# Patient Record
Sex: Male | Born: 1950 | Race: Black or African American | Hispanic: No | Smoking: Never smoker
Health system: Southern US, Community
[De-identification: ages and names within clinical notes are randomized; demographics above are authoritative.]

## PROBLEM LIST (undated history)

## (undated) DIAGNOSIS — Z7901 Long term (current) use of anticoagulants: Secondary | ICD-10-CM

## (undated) DIAGNOSIS — I10 Essential (primary) hypertension: Secondary | ICD-10-CM

## (undated) DIAGNOSIS — E7800 Pure hypercholesterolemia, unspecified: Secondary | ICD-10-CM

## (undated) DIAGNOSIS — I350 Nonrheumatic aortic (valve) stenosis: Secondary | ICD-10-CM

## (undated) HISTORY — DX: Essential (primary) hypertension: I10

## (undated) HISTORY — DX: Nonrheumatic aortic (valve) stenosis: I35.0

## (undated) HISTORY — DX: Pure hypercholesterolemia, unspecified: E78.00

## (undated) HISTORY — PX: VASECTOMY: SHX75

## (undated) HISTORY — DX: Long term (current) use of anticoagulants: Z79.01

---

## 2003-09-12 ENCOUNTER — Ambulatory Visit (HOSPITAL_COMMUNITY): Admission: RE | Admit: 2003-09-12 | Discharge: 2003-09-12 | Payer: Self-pay | Admitting: Gastroenterology

## 2005-02-11 HISTORY — PX: AORTIC VALVE REPLACEMENT: SHX41

## 2005-05-24 ENCOUNTER — Inpatient Hospital Stay (HOSPITAL_BASED_OUTPATIENT_CLINIC_OR_DEPARTMENT_OTHER): Admission: RE | Admit: 2005-05-24 | Discharge: 2005-05-24 | Payer: Self-pay | Admitting: Cardiology

## 2005-06-27 ENCOUNTER — Inpatient Hospital Stay (HOSPITAL_COMMUNITY): Admission: RE | Admit: 2005-06-27 | Discharge: 2005-07-02 | Payer: Self-pay | Admitting: Surgery

## 2005-06-27 ENCOUNTER — Encounter (INDEPENDENT_AMBULATORY_CARE_PROVIDER_SITE_OTHER): Payer: Self-pay | Admitting: *Deleted

## 2009-10-05 ENCOUNTER — Ambulatory Visit: Payer: Self-pay | Admitting: Cardiology

## 2009-11-07 ENCOUNTER — Ambulatory Visit: Payer: Self-pay | Admitting: Cardiology

## 2009-12-05 ENCOUNTER — Ambulatory Visit: Payer: Self-pay | Admitting: Cardiology

## 2010-01-02 ENCOUNTER — Ambulatory Visit: Payer: Self-pay | Admitting: Cardiology

## 2010-02-06 ENCOUNTER — Ambulatory Visit: Payer: Self-pay | Admitting: Cardiology

## 2010-03-08 ENCOUNTER — Ambulatory Visit: Payer: Self-pay | Admitting: Cardiology

## 2010-04-06 ENCOUNTER — Ambulatory Visit (INDEPENDENT_AMBULATORY_CARE_PROVIDER_SITE_OTHER): Payer: BC Managed Care – PPO | Admitting: Cardiology

## 2010-04-06 ENCOUNTER — Ambulatory Visit: Payer: Self-pay | Admitting: Cardiology

## 2010-04-06 DIAGNOSIS — E7800 Pure hypercholesterolemia, unspecified: Secondary | ICD-10-CM

## 2010-04-06 DIAGNOSIS — I059 Rheumatic mitral valve disease, unspecified: Secondary | ICD-10-CM

## 2010-04-06 DIAGNOSIS — I10 Essential (primary) hypertension: Secondary | ICD-10-CM

## 2010-04-06 DIAGNOSIS — Z7901 Long term (current) use of anticoagulants: Secondary | ICD-10-CM

## 2010-04-06 LAB — PROTIME-INR: INR: 3.4 — AB (ref <=1.1)

## 2010-04-11 ENCOUNTER — Ambulatory Visit: Payer: Self-pay | Admitting: Cardiology

## 2010-05-02 ENCOUNTER — Encounter: Payer: Self-pay | Admitting: Cardiology

## 2010-05-02 DIAGNOSIS — I350 Nonrheumatic aortic (valve) stenosis: Secondary | ICD-10-CM | POA: Insufficient documentation

## 2010-05-02 DIAGNOSIS — I10 Essential (primary) hypertension: Secondary | ICD-10-CM | POA: Insufficient documentation

## 2010-05-02 DIAGNOSIS — Z7901 Long term (current) use of anticoagulants: Secondary | ICD-10-CM | POA: Insufficient documentation

## 2010-05-02 DIAGNOSIS — E7800 Pure hypercholesterolemia, unspecified: Secondary | ICD-10-CM | POA: Insufficient documentation

## 2010-05-03 ENCOUNTER — Ambulatory Visit (INDEPENDENT_AMBULATORY_CARE_PROVIDER_SITE_OTHER): Payer: BC Managed Care – PPO | Admitting: *Deleted

## 2010-05-03 DIAGNOSIS — Z7901 Long term (current) use of anticoagulants: Secondary | ICD-10-CM

## 2010-05-03 DIAGNOSIS — I059 Rheumatic mitral valve disease, unspecified: Secondary | ICD-10-CM

## 2010-05-03 LAB — POCT INR: INR: 3.6

## 2010-05-15 ENCOUNTER — Ambulatory Visit (INDEPENDENT_AMBULATORY_CARE_PROVIDER_SITE_OTHER): Payer: BC Managed Care – PPO | Admitting: *Deleted

## 2010-05-15 DIAGNOSIS — Z7901 Long term (current) use of anticoagulants: Secondary | ICD-10-CM

## 2010-05-15 DIAGNOSIS — I359 Nonrheumatic aortic valve disorder, unspecified: Secondary | ICD-10-CM | POA: Insufficient documentation

## 2010-05-15 LAB — POCT INR: INR: 2.8

## 2010-05-31 ENCOUNTER — Other Ambulatory Visit: Payer: Self-pay | Admitting: Cardiology

## 2010-06-01 ENCOUNTER — Other Ambulatory Visit: Payer: Self-pay | Admitting: *Deleted

## 2010-06-01 DIAGNOSIS — Z952 Presence of prosthetic heart valve: Secondary | ICD-10-CM

## 2010-06-01 MED ORDER — WARFARIN SODIUM 5 MG PO TABS: 5.0000 mg | ORAL_TABLET | ORAL | Status: DC

## 2010-06-01 NOTE — Telephone Encounter (Signed)
escribe medication per fax request  

## 2010-06-12 ENCOUNTER — Ambulatory Visit (INDEPENDENT_AMBULATORY_CARE_PROVIDER_SITE_OTHER): Payer: BC Managed Care – PPO | Admitting: *Deleted

## 2010-06-12 ENCOUNTER — Encounter: Payer: BC Managed Care – PPO | Admitting: *Deleted

## 2010-06-12 DIAGNOSIS — Z954 Presence of other heart-valve replacement: Secondary | ICD-10-CM

## 2010-06-12 DIAGNOSIS — I359 Nonrheumatic aortic valve disorder, unspecified: Secondary | ICD-10-CM

## 2010-06-12 DIAGNOSIS — Z7901 Long term (current) use of anticoagulants: Secondary | ICD-10-CM | POA: Insufficient documentation

## 2010-06-12 LAB — POCT INR: INR: 3.3

## 2010-06-29 NOTE — Op Note (Signed)
Nathan Frye, Nathan Frye             ACCOUNT NO.:  [id number]   MEDICAL RECORD NO.:  [id number]          PATIENT TYPE:  INP   LOCATION:  2303                         FACILITY:  [facility]   PHYSICIAN:  [name], M.D.     DATE OF BIRTH:  02-23-1950   DATE OF PROCEDURE:  06/27/2005  DATE OF DISCHARGE:                                 OPERATIVE REPORT   PREOPERATIVE DIAGNOSIS:  Severe aortic stenosis.   POSTOPERATIVE DIAGNOSIS:  Severe aortic stenosis.   OPERATIVE PROCEDURE:  Aortic valve replacement using a 21-mm St. Jude Regent  mechanical valve.   ATTENDING SURGEON:  [name], M.D.   ASSISTANT:  [name], M.D.   ANESTHESIA:  General endotracheal.   CLINICAL HISTORY:  This patient is a 60 year old gentleman with a history of  bicuspid aortic valve with aortic stenosis, who has been followed by Dr.  Peter Frye since 2005.  He was initially asymptomatic, but has had 2  episodes of transient syncope over the past year during exercise.  He has  also had some exertional chest tightness and shortness of breath,  particularly when going up stairs carrying something.  He had an  echocardiogram in November of 2006 that showed progression of his aortic  stenosis with a peak gradient of 99 and a mean gradient of 62, with a valve  area of 0.68 sq cm.  This had progressed significantly from his previous  echo of April 2006.  Cardiac catheterization showed no significant coronary  disease.  There was about 20% eccentric plaque at the ostium of the LAD.  There is severe aortic stenosis with a peak transvalvular gradient of 68 and  a mean gradient of 58.  The aortic valve area was 0.8 sq cm and ejection  fraction was 65% to 70%.  There was minimal enlargement of the aortic root.  After review of the angiogram and examination of the patient, it was felt  that aortic valve replacement would be the best treatment to prevent further  complications of severe aortic stenosis.  I discussed  the alternatives with  the patient including use of a mechanical or tissue valve.  We discussed the  pros and cons of both and I recommended a mechanical valve, given his age of  60 with normal coronary arteries.  He had no contraindication to  anticoagulation.  He was in agreement with this and understood that he would  have to take Coumadin for the rest of his life.  I discussed the benefits  and risks of surgery including, but not limited to bleeding, blood  transfusion, infection, stroke, myocardial infarction and death.  He  understood and agreed to proceed.   OPERATIVE PROCEDURE:  The patient was taken to the operating room and placed  on the table in the a supine position.  After induction of general  endotracheal anesthesia, a Foley catheter was placed in the bluntly  developed using sterile technique.  Then the chest, abdomen and both lower  extremities were prepped and draped in the usual sterile manner.  A  transesophageal echo was performed by Anesthesiology.  This showed  severe  calcific aortic stenosis.  Left ventricular function was well-preserved.  There was no mitral regurgitation.   Then the chest was opened through a median sternotomy incision and the  pericardium opened in the midline.  Examination of the heart showed good  ventricular contractility.  The ascending aorta had no palpable plaques in  it.   Then the patient was heparinized and when an adequate activated clotting  time was achieved, the distal ascending aorta was cannulated using a 20-  [place] aortic cannula for arterial inflow.  Venous outflow was achieved  using a two-stage venous cannula through the right atrial appendage.  An  antegrade cardioplegia and vent cannula was inserted in the aortic root.  A  left ventricular vent was placed through the right superior pulmonary vein  and a retrograde cardioplegia cannula through the right atrium and the  coronary sinus.   The patient was placed on  cardiopulmonary bypass.  Then the aorta was cross-  clamped and 700 mL of cold blood antegrade cardioplegia were administered in  the aortic root with quick arrest of the heart.  Systemic hypothermia to 28  degrees centigrade and topical hypothermia with iced saline was used.  A  temperature probe was placed in the septum and an insulating pad in the  pericardium.  Additional doses of retrograde cardioplegia were given at  about 20-minute intervals to maintain a myocardial temperature around 10  degrees centigrade.   Then the aorta was opened transversely about 1 cm above the takeoff of the  right coronary ostium.  Examination of the native valve showed that there  were actually 3 leaflets.  There was complete fusion between the right  coronary and non-coronary cusps, given a functionally bicuspid appearance.  The valve was heavily calcified with immobile leaflets.  The annulus had  moderate calcification.  The coronary ostia were identified and had no  disease in them.  They were lying fairly high away from the annulus.  Then  the native aortic valve was excised.  The annulus was decalcified with  rongeurs.  Care was taken to remove all particulate debris.  The aortic root  and left ventricle were irrigated with iced saline solution.  Then the  annulus was sized and a 21-mm St. Jude Regent mechanical valve was chosen;  this had  model #[id number]-751, serial number [id number].  Then a series of  pledgeted 2-0 Ethibond horizontal mattress sutures were placed around the  annulus with the pledgets in the subannular position.  The sutures were  placed through the sewing ring and the valve lowered into place.  The  sutures were tied sequentially.  The valve seated nicely.  The leaflets  moved without obstruction.  Then the patient was rewarmed to 37 degrees  centigrade.  The aorta was closed in 2 layers using continuous 4-0 Prolene suture.  Then de-airing maneuvers were performed and the head placed  in a  Trendelenburg position.  The cross-clamp was removed with a time of 59  minutes.  There was a spontaneous return of sinus rhythm.  Two temporary  right ventricular and right atrial pacing wires were placed and brought out  through the skin.   When the patient had rewarmed to 37 degrees centigrade, he was weaned from  cardiopulmonary bypass on no inotropic agents.  Total bypass time was 85  minutes.  Cardiac function appeared excellent with a cardiac output of 5  L/min.  A transesophageal echocardiogram showed normal-functioning St. Jude  aortic valve prosthesis.  There was no evidence of regurgitation or  perivalvular leak.  Then the protamine was given and the venous and aortic  cannulas were removed without difficulty.  Hemostasis was achieved.  Two  chest tubes were placed with a tube in the posterior pericardium and one in  the anterior mediastinum.  The pericardium was loosely reapproximated over  the heart.  The sternum was closed with #6 stainless steel wires.  The  fascia was closed with a continuous #1 Vicryl suture.  The subcutaneous  tissue was closed with a continuous 2-0 Vicryl and the skin with a 3-0  Vicryl subcuticular  closure.  The sponge, needle and instrument counts were correct according to  the scrub nurse.  Dry sterile dressings were applied over the incision and  around the chest tubes which were hooked to Pleur-evac suction.  The patient  remained hemodynamically stable and was transported to the SICU in guarded,  but stable condition.      [name], M.D.  Electronically Signed     BB/MEDQ  D:  06/27/2005  T:  06/28/2005  Job:  [id number]   cc:   Nathan M. Frye, M.D.  Fax: [phone]   [facility]

## 2010-06-29 NOTE — Op Note (Signed)
Nathan Frye, Nathan Frye             ACCOUNT NO.:  [id number]   MEDICAL RECORD NO.:  [id number]          PATIENT TYPE:  INP   LOCATION:  2303                         FACILITY:  [facility]   PHYSICIAN:  Kaylyn Layer. Michelle Piper, M.D.   DATE OF BIRTH:  [birth year]   DATE OF PROCEDURE:  06/27/2005  DATE OF DISCHARGE:                                 OPERATIVE REPORT   PROCEDURE PERFORMED:  Transesophageal echocardiographic probe placement and  monitoring during aortic valve replacement.   I was consulted by [name], M.D. to place the transesophageal echo  probe into [name] for monitoring during aortic valve replacement.   The patient is a 60 year old gentleman with a congenital bicuspid aortic  valve who presents with tight aortic stenosis.  He presents today for aortic  valve replacement.   After uneventful induction of anesthesia and endotracheal intubation, a  transesophageal echo probe was easily passed into the stomach.  Initial  short axis view of the left ventricle showed left ventricular hypertrophy  with no wall motion abnormalities.  Turning to the four chamber view of the  mitral valve.  The valve tips coapted normally in all segments and there was  a small amount of anterior leaflet prolapse during systole in the 0 degree  view.  Placing Color Flow Doppler across the mitral valve there was a trace  of mitral regurgitation.  The left atrium was normal.  It was not enlarged.  The interatrial septum was intact.  Right side of the heart was within  normal limits.  Turning to the aortic valve, which was heavily calcified,  was bicuspid and was impossible to measure using planimetry the valve area.  In the longitudinal view there was large supravalvular jet of aortic  stenosis on placing Color across the valve with no aortic insufficiency.   The patient was placed on cardiopulmonary bypass by Dr. Laneta Simmers and underwent  an aortic valve replacement.  Immediately prior to discontinuing  cardiopulmonary bypass there was a significant amount of intracardiac air  seen which was removed using various deairing techniques by Dr. Laneta Simmers.  The  patient then was discontinued from cardiopulmonary bypass using a minimal  amount of inotropes and at this point the prosthetic aortic valve could be  seen in situ working well without any perivalvular leaks. There was no  aortic insufficiency.  Rest of the cardiac exam was unchanged.  The left  ventricle was hypertrophied.  There were no wall motion abnormalities.  Mitral valve showed trace mitral regurgitation.  The right side of the heart  was normal.  Interatrial septum was intact.   The transesophageal echo probe was then removed from the patient.  He was  taken to the intensive care unit intubated in good condition.           ______________________________  Kaylyn Layer Michelle Piper, M.D.     KDO/MEDQ  D:  06/27/2005  T:  06/28/2005  Job:  [id number]

## 2010-06-29 NOTE — Discharge Summary (Signed)
Nathan Frye, Nathan Frye             ACCOUNT NO.:  [id number]   MEDICAL RECORD NO.:  [id number]          PATIENT TYPE:  INP   LOCATION:  2010                         FACILITY:  [facility]   PHYSICIAN:  [name], M.D.     DATE OF BIRTH:  1950-10-18   DATE OF ADMISSION:  06/27/2005  DATE OF DISCHARGE:  07/02/2005                                 DISCHARGE SUMMARY   HISTORY OF PRESENT ILLNESS:  The patient is a 60 year old gentleman referred  to Dr. [name] for consideration of aortic valve replacement.  The  patient has a history of a congenitally bicuspid aortic valve with aortic  stenosis and has been followed by Dr. Sw[place]iland since 2005.  He was noted by  Dr. Andi Devon on physical examination to have a heart murmur.  He was  asymptomatic initially and has been followed with serial echocardiograms.  He has remained very active and exercises several days a week walking on the  treadmill as well as running.  He did have two episodes of transient syncope  over the past year during exercise.  He also reported exertional chest  tightness and shortness of breath when going up stairs, particularly if he  is carrying something.  He had an echocardiogram in November of 2006 which  showed progression of his aortic stenosis to a peak gradient of 99 and a  mean gradient of 62 with a valve area of 0.68.  This had been a significant  progression from previous study done in April of 2006.  He underwent cardiac  catheterization on May 24, 2005 which showed no significant coronary  disease.  There was about a 20% eccentric plaque in the ostium of the LAD.  There was severe aortic stenosis with a peak transvalvular gradient of 68  and a mean gradient of 58.  Aortic valve area was 0.8 sq cm.  Cardiac output  was 5.3-5.7.  Left ventricular ejection fraction was 65-70%.  There was  minimal enlargement of the aortic root.  The aortic valve was heavily  calcified.  The patient was referred to Dr. Laneta Simmers for  surgical opinion and  he recommended proceeding with aortic valve replacement.  He was admitted  this hospitalization for the procedure.   REVIEW OF SYSTEMS:  Please see the history and physical done at the time of  admission.   PAST MEDICAL HISTORY:  1.  Aortic stenosis as mentioned above.  2.  History of hypertension.   His only previous surgery was a vasectomy.   ALLERGIES:  None.   MEDICATIONS PRIOR TO ADMISSION:  Hyzaar 50/12.5 one daily.   FAMILY HISTORY:  Please see the history and physical done at the time of  admission.   SOCIAL HISTORY:  Please see the history and physical done at the time of  admission.   PHYSICAL EXAMINATION:  Please see the history and physical done at the time  of admission.   HOSPITAL COURSE:  Patient was admitted electively and on Jun 27, 2005 he was  taken to the operating room where he underwent the following procedure.  Aortic valve replacement with a 21  mm St. Jude Regent mechanical valve.  The  procedure was performed by [name], M.D., tolerated well and he was  taken to the surgical intensive care unit in stable condition.   POSTOPERATIVE HOSPITAL COURSE:  Patient has done quite well.  He has  remained hemodynamically stable.  He initially did require some inotropic  support, specifically Neo-Synephrine for postoperative azo dilation.  This  resolved over time and his Neo was weaned off.  He has maintained stable  hemodynamics since that time and additionally remains in normal sinus  rhythm.  The ventilator was weaned without difficulty.  All routine lines,  monitors, and drainage devices were discontinued in the standard fashion.  The patient does have a notable significant postoperative acute blood loss  anemia, but is clinically tolerating it quite well.  His most recent  hemoglobin and hematocrit are felt to be stable at 7.5 and 22.4,  respectively on Jul 01, 2005.  The patient's incision is healing well  without signs of  infection.  He has tolerated a routine and rapid advance in  activity commensurate for level of postoperative convalescence using routine  cardiac rehabilitation phase I protocols.  The patient has been started on  Coumadin and INR has been monitored daily.  His home dosing will be  determined prior to discharge.  The patient's renal function has remained  stable.  His most recent BUN and creatinine dated Jul 01, 2005 at 7 and 0.9,  respectively.  The patient's overall status is felt to be stable for  tentative discharge in the morning of Jul 02, 2005 pending morning round  reevaluation.   CONDITION ON DISCHARGE:  Stable and improving.   DISCHARGE MEDICATIONS:  1.  Toprol XL 25 mg daily.  2.  Tylox one or two every four to six hours as needed.  3.  Ferrous gluconate 324 mg twice daily.  4.  Folic acid 1 mg daily.  5.  Coumadin as determined by INR with monitoring through Dr. Elvis Coil      office.  6.  Lasix 40 mg daily for five days.  7.  K-Dur 20 mEq daily for five days.  8.  For pain Tylox one or two every four to six hours as needed.   FOLLOW-UP:  Patient is instructed to follow up with Dr. Swazil[place] in two weeks.  Additionally, follow up his office on Jul 04, 2005 to have his INR checked.  Additional follow-up will include Dr. Laneta Simmers.  The [facility] office will contact  him.   CONDITION ON DISCHARGE:  Stable and improved.   FINAL DIAGNOSES:  1.  Severe aortic stenosis now status post mechanical valve prosthesis      placement as described above.  2.  Acute blood loss anemia.  3.  Hypertension.      Rowe Clack, P.A.-C.      [name], M.D.  Electronically Signed    WEG/MEDQ  D:  07/01/2005  T:  07/02/2005  Job:  [id number]   cc:   Peter M. Swaziland, M.D.  Fax: [phone]   Gabriel Earing, M.D.  Fax: [phone]

## 2010-06-29 NOTE — H&P (Signed)
[name], [name] NO.:  [id number]   MEDICAL RECORD NO.:  [id number]           PATIENT TYPE:   LOCATION:                                 FACILITY:   PHYSICIAN:  Peter M. Swaziland, M.D.  DATE OF BIRTH:  May 08, 1950   DATE OF ADMISSION:  05/24/2005  DATE OF DISCHARGE:                                HISTORY & PHYSICAL   HISTORY OF PRESENT ILLNESS:  Mr. Nathan Frye a 60 year old, black male who has  a history of the bicuspid aortic valve with aortic stenosis.  This has been  followed serially by myself since 2005.  Over that time, he has had evidence  of severe aortic stenosis that has been progressive.  Initially, he was  asymptomatically and we recommended just following him clinically.  He is  very active.  However, this past year, he had two episodes while walking on  treadmill where he had transient syncope.  He states he transiently blacked  out.  He attributes this to the fact that he had not warmed up as much as  usual.  He had no associated chest pain or dyspnea and he continues to  exercise regularly.  However, his last echocardiogram dated January 10, 2005, showed progression of his aortic stenosis.  His peak instantaneous  gradient was 99 mmHg, mean gradient of 62 mmHg and his aortic valve area was  0.68 sq cm.  Compared to his prior study in April 2006, this had progressed  significantly.  Because of his now symptomatic status and severe aortic  stenosis, it is recommended he undergo cardiac catheterization at this time  with ultimate plans for aortic valve replacement surgery.   PAST MEDICAL HISTORY:  1.  Bicuspid aortic valve with severe aortic stenosis.  2.  Hypertension.   PAST SURGICAL HISTORY:  He has had prior vasectomy.   ALLERGIES:  No known drug allergies.   CURRENT MEDICATIONS:  Hyzaar 50/12.5 mg daily.   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  He denies tobacco or alcohol use.  He is married and has  one child.   FAMILY HISTORY:  Father is age 3.  Mother is age 68.  He does not know  their medical history as he was raised by other family members.  He has a  strong family history of hypertension.   REVIEW OF SYSTEMS:  His review of systems otherwise negative.   PHYSICAL EXAMINATION:  GENERAL:  The patient is a thin, black male in no  apparent distress.  VITAL SIGNS:  Weight 157, blood pressure 122/78, pulse is 80 and regular.  HEENT:  Normocephalic, atraumatic.  Pupils equal, round, reactive to light  accommodation.  Extraocular movements were full.  Oropharynx is clear.  NECK:  Neck is without JVD, adenopathy, thyromegaly or bruits.  LUNGS:  Lungs were clear.  CARDIAC:  Exam reveals a harsh, grade 3/6 systolic murmur heard best in the  aortic area radiating to the apex and to the carotids. There is no palpable  thrill.  There is no diastolic murmur.  He has no gallops.  ABDOMEN:  Abdomen is soft, nontender without hepatosplenomegaly, masses or  bruits.  Femoral and pedal pulses are 2+ and symmetric.  NEUROLOGIC:  Neurologic exam is nonfocal.   LABORATORY DATA:  Chest x-ray shows no active disease.  ECG shows normal  sinus rhythm, left atrial enlargement and LVH with repolarization  abnormality.   IMPRESSION:  1.  History of bicuspid aortic valve with severe aortic stenosis, now      symptomatic with episodes of exertional syncope.  2.  Hypertension.  3.  Left ventricular hypertrophy.   PLAN:  Recommend diagnostic cardiac catheterization.  Subsequent referral  for aortic valve replacement surgery.           ______________________________  Peter M. Swaziland, M.D.     PMJ/MEDQ  D:  05/23/2005  T:  05/23/2005  Job:  [id number]   cc:   Gabriel Earing, M.D.  Fax: [phone]

## 2010-06-29 NOTE — Op Note (Signed)
NAME:  Nathan Frye, Nathan Frye                         ACCOUNT NO.:  [id number]   MEDICAL RECORD NO.:  [id number]                   PATIENT TYPE:  AMB   LOCATION:  ENDO                                 FACILITY:  [facility]   PHYSICIAN:  Anselmo Rod, M.D.               DATE OF BIRTH:  Sep 09, 1950   DATE OF PROCEDURE:  09/12/2003  DATE OF DISCHARGE:                                 OPERATIVE REPORT   PROCEDURE PERFORMED:  Screening colonoscopy.   ENDOSCOPIST:  Anselmo Rod, M.D.   INSTRUMENT USED:  Olympus video colonoscope.   INDICATION FOR PROCEDURE:  A 60 year old African-American male undergoing  screening colonoscopy to rule out colonic polyps, masses, etc.   PREPROCEDURE PREPARATION:  Informed consent was procured from the patient.  The patient was fasted for eight hours prior to the procedure and prepped  with a bottle of magnesium citrate and a gallon of GoLYTELY the night prior  to the procedure.   PREPROCEDURE PHYSICAL:  VITAL SIGNS:  The patient had stable vital signs.  NECK:  Supple.  CHEST:  Clear to auscultation.  S1, S2 regular.  ABDOMEN:  Soft with normal bowel sounds.   DESCRIPTION OF PROCEDURE:  The patient was placed in the left lateral  decubitus position and sedated with 90 mg of Demerol and 9 mg of Versed in  slow incremental doses.  Once the patient was adequately sedate and  maintained on low-flow oxygen and continuous cardiac monitoring, the Olympus  video colonoscope was advanced from the rectum to the cecum.  The  appendiceal orifice and the ileocecal valve were clearly visualized and  photographed.  Small internal hemorrhoids were seen on retroflexion.  No  masses, polyps, or diverticula were present.  The patient tolerated the  procedure well without immediate complications.   IMPRESSION:  1. Normal colonoscopy up to the cecum except for small internal hemorrhoids.  2. No masses, polyps, or diverticula seen.   RECOMMENDATIONS:  1. A repeat colonoscopy  has been recommended in the next 10 years unless the     patient develops any abnormal symptoms in the interim.  2. Outpatient follow-up as the need arises in the future.                                               Anselmo Rod, M.D.    JNM/MEDQ  D:  09/12/2003  T:  09/12/2003  Job:  [id number]   cc:   Gabriel Earing, M.D.  [address]  [place]  [place] [address]  Fax: [phone]

## 2010-06-29 NOTE — Cardiovascular Report (Signed)
Nathan Frye, Nathan Frye             ACCOUNT NO.:  [id number]   MEDICAL RECORD NO.:  [id number]          PATIENT TYPE:  OIB   LOCATION:  1962                         FACILITY:  [facility]   PHYSICIAN:  Peter M. Sw[place]il[place], M.D.  DATE OF BIRTH:  October 09, 1950   DATE OF PROCEDURE:  05/24/2005  DATE OF DISCHARGE:                              CARDIAC CATHETERIZATION   INDICATIONS FOR PROCEDURE:  A 59 year old black male with bicuspid aortic  valve and severe aortic stenosis and is now symptomatic.  Cardiac  catheterization was performed for preoperative evaluation.   PROCEDURES:  Right and left catheterization, coronary and left ventricular  angiography.   EQUIPMENT USED:  4-French 4 cm right and left Judkins catheter, 4-French  pigtail catheter, 4-French left Amplatz-I catheter, 5-French arterial  sheath, 6-French venous sheath and a balloon tip Swan-Ganz catheter.   ACCESS:  Via the right femoral artery and vein using a standard Seldinger  technique.   MEDICATIONS:  Local anesthesia and 1% Xylocaine.   CONTRAST:  150 mL of Omnipaque.   HEMODYNAMIC DATA:  Right atrial pressures 8/6 with a mean of 4 mmHg.  Right  ventricular pressure is 37 with EDP of 7 mmHg.  Pulmonary artery pressure is  32/10 with a mean of 20 mmHg.  Pulmonary capillary wedge pressure is 15/17  with a mean of 11 mmHg.  Aortic pressure is 139/82 with a mean of 106.  Left  ventricle pressure is 207 with EDP of 19.  A simultaneous measurement of the  peak aortic valve gradient was 68 mmHg with a mean gradient of 58 mmHg.  Aortic valve area is calculated at 0.8 cm2.  There was no mitral valve  gradient noted.  Cardiac output by Fick determination is 5.7 liters per  minute with an index of 3.0 by thermodilution.  His cardiac output was 5.3  with an index of 2.79.   ANGIOGRAPHIC DATA:  Left ventricular angiography was performed in the RAO  view.  This demonstrates normal left ventricular size with vigorous  contractility and  ejection fraction of 65-70%.  There is no significant  mitral insufficiency.  The aortic root appears only slightly enlarged.  The  aortic valve is heavily calcified and deformed with reduced opening.   Coronary angiography, left coronary rises and distributes normally.  The  left main coronary is normal.   Left anterior descending artery demonstrates an eccentric 20% plaque at the  ostium.  Otherwise there was minimal irregularities in the proximal vessel.   Left circumflex coronary appears normal.   The right coronary has a marked anterior takeoff.  He otherwise is normal.   FINAL INTERPRETATION:  1.  Severe aortic stenosis.  2.  Normal left ventricular function.  3.  No significant obstructive coronary artery disease.  4.  Normal right heart pressures.   PLAN:  Recommend cardiac surgery consult for aortic valve replacement.           ______________________________  Peter M. [place]il[place], M.D.     PMJ/MEDQ  D:  05/24/2005  T:  05/24/2005  Job:  [id number]   cc:   Gabriel Earing, M.D.  Fax: [phone]   [name], M.D.  [address]  [place]  [place] [address]

## 2010-07-05 ENCOUNTER — Ambulatory Visit (INDEPENDENT_AMBULATORY_CARE_PROVIDER_SITE_OTHER): Payer: BC Managed Care – PPO | Admitting: *Deleted

## 2010-07-05 DIAGNOSIS — I359 Nonrheumatic aortic valve disorder, unspecified: Secondary | ICD-10-CM

## 2010-07-05 LAB — POCT INR: INR: 3.6

## 2010-07-06 ENCOUNTER — Encounter: Payer: BC Managed Care – PPO | Admitting: *Deleted

## 2010-07-10 ENCOUNTER — Encounter: Payer: BC Managed Care – PPO | Admitting: *Deleted

## 2010-07-11 ENCOUNTER — Encounter: Payer: BC Managed Care – PPO | Admitting: *Deleted

## 2010-08-02 ENCOUNTER — Ambulatory Visit (INDEPENDENT_AMBULATORY_CARE_PROVIDER_SITE_OTHER): Payer: BC Managed Care – PPO | Admitting: *Deleted

## 2010-08-02 DIAGNOSIS — I359 Nonrheumatic aortic valve disorder, unspecified: Secondary | ICD-10-CM

## 2010-08-02 DIAGNOSIS — Z954 Presence of other heart-valve replacement: Secondary | ICD-10-CM

## 2010-08-02 DIAGNOSIS — Z7901 Long term (current) use of anticoagulants: Secondary | ICD-10-CM

## 2010-08-02 LAB — POCT INR: INR: 2.3

## 2010-08-24 ENCOUNTER — Encounter: Payer: BC Managed Care – PPO | Admitting: *Deleted

## 2010-08-29 ENCOUNTER — Ambulatory Visit (INDEPENDENT_AMBULATORY_CARE_PROVIDER_SITE_OTHER): Payer: BC Managed Care – PPO | Admitting: *Deleted

## 2010-08-29 DIAGNOSIS — Z954 Presence of other heart-valve replacement: Secondary | ICD-10-CM

## 2010-08-29 DIAGNOSIS — Z7901 Long term (current) use of anticoagulants: Secondary | ICD-10-CM

## 2010-08-29 DIAGNOSIS — I359 Nonrheumatic aortic valve disorder, unspecified: Secondary | ICD-10-CM

## 2010-08-29 LAB — POCT INR: INR: 2.9

## 2010-09-13 ENCOUNTER — Other Ambulatory Visit: Payer: Self-pay | Admitting: Cardiology

## 2010-09-14 NOTE — Telephone Encounter (Signed)
escribe medication per fax request  

## 2010-09-25 ENCOUNTER — Ambulatory Visit (INDEPENDENT_AMBULATORY_CARE_PROVIDER_SITE_OTHER): Payer: BC Managed Care – PPO | Admitting: *Deleted

## 2010-09-25 DIAGNOSIS — Z7901 Long term (current) use of anticoagulants: Secondary | ICD-10-CM

## 2010-09-25 DIAGNOSIS — Z954 Presence of other heart-valve replacement: Secondary | ICD-10-CM

## 2010-09-25 DIAGNOSIS — I359 Nonrheumatic aortic valve disorder, unspecified: Secondary | ICD-10-CM

## 2010-09-25 LAB — POCT INR: INR: 2.4

## 2010-10-16 ENCOUNTER — Ambulatory Visit (INDEPENDENT_AMBULATORY_CARE_PROVIDER_SITE_OTHER): Payer: BC Managed Care – PPO | Admitting: *Deleted

## 2010-10-16 DIAGNOSIS — I359 Nonrheumatic aortic valve disorder, unspecified: Secondary | ICD-10-CM

## 2010-10-16 DIAGNOSIS — Z954 Presence of other heart-valve replacement: Secondary | ICD-10-CM

## 2010-10-16 DIAGNOSIS — Z7901 Long term (current) use of anticoagulants: Secondary | ICD-10-CM

## 2010-10-16 LAB — POCT INR: INR: 3.2

## 2010-10-25 ENCOUNTER — Encounter: Payer: Self-pay | Admitting: Cardiology

## 2010-10-25 ENCOUNTER — Other Ambulatory Visit: Payer: BC Managed Care – PPO | Admitting: *Deleted

## 2010-10-25 ENCOUNTER — Ambulatory Visit (INDEPENDENT_AMBULATORY_CARE_PROVIDER_SITE_OTHER): Payer: BC Managed Care – PPO | Admitting: Cardiology

## 2010-10-25 VITALS — BP 120/78 | HR 60 | Wt 166.0 lb

## 2010-10-25 DIAGNOSIS — Z954 Presence of other heart-valve replacement: Secondary | ICD-10-CM

## 2010-10-25 DIAGNOSIS — I10 Essential (primary) hypertension: Secondary | ICD-10-CM

## 2010-10-25 DIAGNOSIS — I350 Nonrheumatic aortic (valve) stenosis: Secondary | ICD-10-CM

## 2010-10-25 DIAGNOSIS — Z952 Presence of prosthetic heart valve: Secondary | ICD-10-CM

## 2010-10-25 DIAGNOSIS — E7800 Pure hypercholesterolemia, unspecified: Secondary | ICD-10-CM

## 2010-10-25 DIAGNOSIS — I359 Nonrheumatic aortic valve disorder, unspecified: Secondary | ICD-10-CM

## 2010-10-25 NOTE — Progress Notes (Signed)
   Nathan Frye Date of Birth: [birth year]   History of Present Illness: Nathan Frye is seen today for followup of his aortic valve disease. He continues to do very well. He has had no significant symptoms of chest pain, shortness of breath, or palpitations. He remains very active. 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  Current Outpatient Prescriptions on File Prior to Visit  Medication Sig Dispense Refill  . CRESTOR 10 MG tablet TAKE 1 TABLET EVERY DAY  30 tablet  5  . losartan-hydrochlorothiazide (HYZAAR) 50-12.5 MG per tablet Take 1 tablet by mouth daily.       . metoprolol tartrate (LOPRESSOR) 25 MG tablet TAKE 1 TABLET EVERY DAY  30 tablet  5  . warfarin (COUMADIN) 5 MG tablet Take 1 tablet (5 mg total) by mouth as directed. Current dose 5 mg every day  30 tablet  5    No Known Allergies  Past Medical History  Diagnosis Date  . Severe aortic stenosis   . Chronic anticoagulation   . HTN (hypertension)   . Hypercholesteremia     Past Surgical History  Procedure Date  . Aortic valve replacement 2007    #21 MM [facility] VALVE  . Vasectomy     History  Smoking status  . Never Smoker   Smokeless tobacco  . Not on file    History  Alcohol Use No    Family History  Problem Relation Age of Onset  . Hypertension Mother     Review of Systems: As noted history of present illness.  All other systems were reviewed and are negative.  Physical Exam: BP 120/78  Pulse 60  Wt 166 lb (75.297 kg) He is a pleasant, thin white male in no acute distress.The patient is alert and oriented x 3.  The mood and affect are normal.  The skin is warm and dry.  Color is normal.  The HEENT exam reveals that the sclera are nonicteric.  The mucous membranes are moist.  The carotids are 2+ without bruits.  There is no thyromegaly.  There is no JVD.  The lungs are clear.  The chest wall is non tender.  The heart exam reveals a regular rate with a normal S1. He has a good  mechanical aortic valve click. There is no murmur or S3. The PMI is not displaced.   Abdominal exam reveals good bowel sounds.  There is no guarding or rebound.  There is no hepatosplenomegaly or tenderness.  There are no masses.  Exam of the legs reveal no clubbing, cyanosis, or edema.  The legs are without rashes.  The distal pulses are intact.  Cranial nerves II - XII are intact.  Motor and sensory functions are intact.  The gait is normal.  LABORATORY DATA: Reviewed from April 2012 he had a normal TSH. Stool was heme negative. A1c was 5.0%. Complete chemistry panel was normal. Total cholesterol 159, triglycerides 57, HDL 72, LDL 76. PSA was 2.1.  Assessment / Plan:

## 2010-10-25 NOTE — Assessment & Plan Note (Signed)
He is asymptomatic. He has been therapeutic on his Coumadin. We will followup an echocardiogram today since it has been 5 years since his surgery. He needs routine SBE prophylaxis.

## 2010-10-25 NOTE — Assessment & Plan Note (Signed)
Lipid levels have been excellent. We will continue with his current therapy.

## 2010-10-25 NOTE — Assessment & Plan Note (Signed)
Blood pressure is under excellent control. 

## 2010-10-25 NOTE — Patient Instructions (Signed)
Continue your current therapy.  I will schedule you for an echocardiogram.  I will see you back in 6 months.

## 2010-10-31 ENCOUNTER — Encounter: Payer: Self-pay | Admitting: *Deleted

## 2010-11-01 ENCOUNTER — Ambulatory Visit (HOSPITAL_COMMUNITY): Payer: BC Managed Care – PPO | Attending: Cardiology | Admitting: Radiology

## 2010-11-01 DIAGNOSIS — I079 Rheumatic tricuspid valve disease, unspecified: Secondary | ICD-10-CM | POA: Insufficient documentation

## 2010-11-01 DIAGNOSIS — I379 Nonrheumatic pulmonary valve disorder, unspecified: Secondary | ICD-10-CM | POA: Insufficient documentation

## 2010-11-01 DIAGNOSIS — I10 Essential (primary) hypertension: Secondary | ICD-10-CM | POA: Insufficient documentation

## 2010-11-01 DIAGNOSIS — E785 Hyperlipidemia, unspecified: Secondary | ICD-10-CM | POA: Insufficient documentation

## 2010-11-01 DIAGNOSIS — I359 Nonrheumatic aortic valve disorder, unspecified: Secondary | ICD-10-CM | POA: Insufficient documentation

## 2010-11-01 DIAGNOSIS — Z952 Presence of prosthetic heart valve: Secondary | ICD-10-CM

## 2010-11-01 DIAGNOSIS — Z954 Presence of other heart-valve replacement: Secondary | ICD-10-CM | POA: Insufficient documentation

## 2010-11-05 ENCOUNTER — Telehealth: Payer: Self-pay | Admitting: *Deleted

## 2010-11-05 NOTE — Telephone Encounter (Signed)
Message copied by Lorayne Bender on Mon Nov 05, 2010  4:49 PM ------      Message from: [place], PETER M      Created: Fri Nov 02, 2010  8:43 AM       Normal EF. AV prosthesis functioning well.

## 2010-11-05 NOTE — Telephone Encounter (Signed)
Notified of echo results. Will send to [facility] on [address].

## 2010-11-13 ENCOUNTER — Ambulatory Visit (INDEPENDENT_AMBULATORY_CARE_PROVIDER_SITE_OTHER): Payer: BC Managed Care – PPO | Admitting: *Deleted

## 2010-11-13 DIAGNOSIS — Z954 Presence of other heart-valve replacement: Secondary | ICD-10-CM

## 2010-11-13 DIAGNOSIS — Z7901 Long term (current) use of anticoagulants: Secondary | ICD-10-CM

## 2010-11-13 DIAGNOSIS — I359 Nonrheumatic aortic valve disorder, unspecified: Secondary | ICD-10-CM

## 2010-11-13 LAB — POCT INR: INR: 3.2

## 2010-11-19 ENCOUNTER — Other Ambulatory Visit: Payer: Self-pay | Admitting: Cardiology

## 2010-11-20 ENCOUNTER — Other Ambulatory Visit: Payer: Self-pay | Admitting: Cardiology

## 2010-12-11 ENCOUNTER — Ambulatory Visit (INDEPENDENT_AMBULATORY_CARE_PROVIDER_SITE_OTHER): Payer: BC Managed Care – PPO | Admitting: *Deleted

## 2010-12-11 DIAGNOSIS — I359 Nonrheumatic aortic valve disorder, unspecified: Secondary | ICD-10-CM

## 2010-12-11 DIAGNOSIS — Z954 Presence of other heart-valve replacement: Secondary | ICD-10-CM

## 2010-12-11 DIAGNOSIS — Z7901 Long term (current) use of anticoagulants: Secondary | ICD-10-CM

## 2010-12-11 LAB — POCT INR: INR: 2.5

## 2011-01-08 ENCOUNTER — Ambulatory Visit (INDEPENDENT_AMBULATORY_CARE_PROVIDER_SITE_OTHER): Payer: BC Managed Care – PPO | Admitting: *Deleted

## 2011-01-08 DIAGNOSIS — I359 Nonrheumatic aortic valve disorder, unspecified: Secondary | ICD-10-CM

## 2011-01-08 DIAGNOSIS — Z7901 Long term (current) use of anticoagulants: Secondary | ICD-10-CM

## 2011-01-08 DIAGNOSIS — Z954 Presence of other heart-valve replacement: Secondary | ICD-10-CM

## 2011-01-08 LAB — POCT INR: INR: 2.4

## 2011-02-07 ENCOUNTER — Ambulatory Visit (INDEPENDENT_AMBULATORY_CARE_PROVIDER_SITE_OTHER): Payer: BC Managed Care – PPO | Admitting: *Deleted

## 2011-02-07 DIAGNOSIS — I359 Nonrheumatic aortic valve disorder, unspecified: Secondary | ICD-10-CM

## 2011-02-07 DIAGNOSIS — Z954 Presence of other heart-valve replacement: Secondary | ICD-10-CM

## 2011-02-07 DIAGNOSIS — Z7901 Long term (current) use of anticoagulants: Secondary | ICD-10-CM

## 2011-02-07 LAB — POCT INR: INR: 2.5

## 2011-03-07 ENCOUNTER — Ambulatory Visit (INDEPENDENT_AMBULATORY_CARE_PROVIDER_SITE_OTHER): Payer: BC Managed Care – PPO | Admitting: Pharmacist

## 2011-03-07 DIAGNOSIS — Z954 Presence of other heart-valve replacement: Secondary | ICD-10-CM

## 2011-03-07 DIAGNOSIS — Z7901 Long term (current) use of anticoagulants: Secondary | ICD-10-CM

## 2011-03-07 DIAGNOSIS — I359 Nonrheumatic aortic valve disorder, unspecified: Secondary | ICD-10-CM

## 2011-03-07 LAB — POCT INR: INR: 2.5

## 2011-03-13 ENCOUNTER — Other Ambulatory Visit: Payer: Self-pay | Admitting: *Deleted

## 2011-03-13 MED ORDER — ROSUVASTATIN CALCIUM 10 MG PO TABS: 10.0000 mg | ORAL_TABLET | Freq: Every day | ORAL | Status: DC

## 2011-03-13 MED ORDER — METOPROLOL TARTRATE 25 MG PO TABS: 25.0000 mg | ORAL_TABLET | Freq: Every day | ORAL | Status: DC

## 2011-03-28 ENCOUNTER — Telehealth: Payer: Self-pay | Admitting: *Deleted

## 2011-03-28 MED ORDER — WARFARIN SODIUM 5 MG PO TABS: ORAL_TABLET | ORAL | Status: DC

## 2011-03-28 NOTE — Telephone Encounter (Signed)
Rx done. 

## 2011-04-04 ENCOUNTER — Ambulatory Visit (INDEPENDENT_AMBULATORY_CARE_PROVIDER_SITE_OTHER): Payer: BC Managed Care – PPO

## 2011-04-04 DIAGNOSIS — Z7901 Long term (current) use of anticoagulants: Secondary | ICD-10-CM

## 2011-04-04 DIAGNOSIS — I359 Nonrheumatic aortic valve disorder, unspecified: Secondary | ICD-10-CM

## 2011-04-04 DIAGNOSIS — Z954 Presence of other heart-valve replacement: Secondary | ICD-10-CM

## 2011-04-04 LAB — POCT INR: INR: 2.9

## 2011-05-02 ENCOUNTER — Ambulatory Visit (INDEPENDENT_AMBULATORY_CARE_PROVIDER_SITE_OTHER): Payer: BC Managed Care – PPO | Admitting: Cardiology

## 2011-05-02 ENCOUNTER — Encounter: Payer: Self-pay | Admitting: Cardiology

## 2011-05-02 VITALS — BP 112/68 | HR 57 | Ht 70.0 in | Wt 167.0 lb

## 2011-05-02 DIAGNOSIS — I10 Essential (primary) hypertension: Secondary | ICD-10-CM

## 2011-05-02 DIAGNOSIS — Z954 Presence of other heart-valve replacement: Secondary | ICD-10-CM

## 2011-05-02 DIAGNOSIS — E785 Hyperlipidemia, unspecified: Secondary | ICD-10-CM

## 2011-05-02 DIAGNOSIS — Z952 Presence of prosthetic heart valve: Secondary | ICD-10-CM

## 2011-05-02 DIAGNOSIS — E7800 Pure hypercholesterolemia, unspecified: Secondary | ICD-10-CM

## 2011-05-02 DIAGNOSIS — I359 Nonrheumatic aortic valve disorder, unspecified: Secondary | ICD-10-CM

## 2011-05-02 DIAGNOSIS — I350 Nonrheumatic aortic (valve) stenosis: Secondary | ICD-10-CM

## 2011-05-02 LAB — BASIC METABOLIC PANEL
BUN: 12 mg/dL (ref 6–23)
CO2: 29 mEq/L (ref 19–32)
Calcium: 9.5 mg/dL (ref 8.4–10.5)
Chloride: 98 mEq/L (ref 96–112)
Creatinine, Ser: 1 mg/dL (ref 0.4–1.5)
GFR: 99.93 mL/min (ref >60.00)
Glucose, Bld: 83 mg/dL (ref 70–99)
Potassium: 3.6 mEq/L (ref 3.5–5.1)
Sodium: 136 mEq/L (ref 135–145)

## 2011-05-02 LAB — LIPID PANEL
Cholesterol: 163 mg/dL (ref 0–200)
HDL: 67 mg/dL (ref >39.00)
LDL Cholesterol: 81 mg/dL (ref 0–99)
Total CHOL/HDL Ratio: 2
Triglycerides: 75 mg/dL (ref 0.0–149.0)
VLDL: 15 mg/dL (ref 0.0–40.0)

## 2011-05-02 LAB — HEPATIC FUNCTION PANEL
ALT: 23 U/L (ref 0–53)
AST: 25 U/L (ref 0–37)
Albumin: 4.5 g/dL (ref 3.5–5.2)
Alkaline Phosphatase: 47 U/L (ref 39–117)
Bilirubin, Direct: 0 mg/dL (ref 0.0–0.3)
Total Bilirubin: 0.8 mg/dL (ref 0.3–1.2)
Total Protein: 7.5 g/dL (ref 6.0–8.3)

## 2011-05-02 NOTE — Assessment & Plan Note (Signed)
Blood pressure is under excellent control today. 

## 2011-05-02 NOTE — Progress Notes (Signed)
   [name] Date of Birth: 08/31/1950   History of Present Illness: Nathan Frye is seen today for followup of his aortic valve disease. He continues to do very well. He has had no significant symptoms of chest pain, shortness of breath, or palpitations. He remains very active. 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 He denies any dizziness, or palpitations.  Current Outpatient Prescriptions on File Prior to Visit  Medication Sig Dispense Refill  . losartan-hydrochlorothiazide (HYZAAR) 50-12.5 MG per tablet Take 1 tablet by mouth daily.       . metoprolol tartrate (LOPRESSOR) 25 MG tablet Take 1 tablet (25 mg total) by mouth daily.  30 tablet  5  . rosuvastatin (CRESTOR) 10 MG tablet Take 1 tablet (10 mg total) by mouth daily.  30 tablet  5  . warfarin (COUMADIN) 5 MG tablet Take as directed by Anticoagulation clinic.  Pt takes up to 1 1/2 tablets daily  35 tablet  3    No Known Allergies  Past Medical History  Diagnosis Date  . Severe aortic stenosis   . Chronic anticoagulation   . HTN (hypertension)   . Hypercholesteremia     Past Surgical History  Procedure Date  . Aortic valve replacement 2007    #21 MM [facility] VALVE  . Vasectomy     History  Smoking status  . Never Smoker   Smokeless tobacco  . Not on file    History  Alcohol Use No    Family History  Problem Relation Age of Onset  . Hypertension Mother     Review of Systems: As noted history of present illness.  All other systems were reviewed and are negative.  Physical Exam: BP 112/68  Pulse 57  Ht 5\' 10"  (1.778 m)  Wt 75.751 kg (167 lb)  BMI 23.96 kg/m2 He is a pleasant, thin white male in no acute distress.The patient is alert and oriented x 3.  The mood and affect are normal.  The skin is warm and dry.  Color is normal.  The HEENT exam reveals that the sclera are nonicteric.  The mucous membranes are moist.  The carotids are 2+ without bruits.  There is no thyromegaly.  There is  no JVD.  The lungs are clear.  The chest wall is non tender.  The heart exam reveals a regular rate with a normal S1. He has a good mechanical aortic valve click. There is no murmur or S3. The PMI is not displaced.   Abdominal exam reveals good bowel sounds.  There is no guarding or rebound.  There is no hepatosplenomegaly or tenderness.  There are no masses.  Exam of the legs reveal no clubbing, cyanosis, or edema.  The legs are without rashes.  The distal pulses are intact.  Cranial nerves II - XII are intact.  Motor and sensory functions are intact.  The gait is normal.  LABORATORY DATA: ECG today demonstrates sinus bradycardia with a rate of 57 beats per minute. He has voltage criteria for LVH.  Assessment / Plan:

## 2011-05-02 NOTE — Assessment & Plan Note (Signed)
He is on chronic Crestor therapy. We will check fasting lab work today. This will include chemistries and lipid panel.

## 2011-05-02 NOTE — Assessment & Plan Note (Signed)
He is status post aortic valve replacement in 2007. Echocardiogram this past fall showed good mechanical valve function. LV function was normal. We will continue with his chronic anticoagulation with Coumadin.

## 2011-05-02 NOTE — Patient Instructions (Signed)
Continue your current medication.  We will check fasting lab work today and call with the results.  I will see you in 6 months.

## 2011-05-08 ENCOUNTER — Other Ambulatory Visit: Payer: Self-pay | Admitting: *Deleted

## 2011-05-08 MED ORDER — METOPROLOL TARTRATE 25 MG PO TABS: 25.0000 mg | ORAL_TABLET | Freq: Every day | ORAL | Status: DC

## 2011-05-08 MED ORDER — ROSUVASTATIN CALCIUM 10 MG PO TABS: 10.0000 mg | ORAL_TABLET | Freq: Every day | ORAL | Status: DC

## 2011-05-16 ENCOUNTER — Ambulatory Visit (INDEPENDENT_AMBULATORY_CARE_PROVIDER_SITE_OTHER): Payer: BC Managed Care – PPO

## 2011-05-16 DIAGNOSIS — Z954 Presence of other heart-valve replacement: Secondary | ICD-10-CM

## 2011-05-16 DIAGNOSIS — Z7901 Long term (current) use of anticoagulants: Secondary | ICD-10-CM

## 2011-05-16 DIAGNOSIS — I359 Nonrheumatic aortic valve disorder, unspecified: Secondary | ICD-10-CM

## 2011-05-16 LAB — POCT INR: INR: 2.4

## 2011-06-27 ENCOUNTER — Ambulatory Visit (INDEPENDENT_AMBULATORY_CARE_PROVIDER_SITE_OTHER): Payer: BC Managed Care – PPO

## 2011-06-27 DIAGNOSIS — I359 Nonrheumatic aortic valve disorder, unspecified: Secondary | ICD-10-CM

## 2011-06-27 DIAGNOSIS — Z954 Presence of other heart-valve replacement: Secondary | ICD-10-CM

## 2011-06-27 DIAGNOSIS — Z7901 Long term (current) use of anticoagulants: Secondary | ICD-10-CM

## 2011-06-27 LAB — POCT INR: INR: 2.9

## 2011-08-08 ENCOUNTER — Ambulatory Visit (INDEPENDENT_AMBULATORY_CARE_PROVIDER_SITE_OTHER): Payer: BC Managed Care – PPO

## 2011-08-08 DIAGNOSIS — I359 Nonrheumatic aortic valve disorder, unspecified: Secondary | ICD-10-CM

## 2011-08-08 DIAGNOSIS — Z7901 Long term (current) use of anticoagulants: Secondary | ICD-10-CM

## 2011-08-08 DIAGNOSIS — Z954 Presence of other heart-valve replacement: Secondary | ICD-10-CM

## 2011-08-08 LAB — POCT INR: INR: 3.1

## 2011-08-12 ENCOUNTER — Other Ambulatory Visit: Payer: Self-pay | Admitting: *Deleted

## 2011-08-12 MED ORDER — WARFARIN SODIUM 5 MG PO TABS: ORAL_TABLET | ORAL | Status: DC

## 2011-09-19 ENCOUNTER — Ambulatory Visit (INDEPENDENT_AMBULATORY_CARE_PROVIDER_SITE_OTHER): Payer: BC Managed Care – PPO | Admitting: Pharmacist

## 2011-09-19 DIAGNOSIS — Z954 Presence of other heart-valve replacement: Secondary | ICD-10-CM

## 2011-09-19 DIAGNOSIS — I359 Nonrheumatic aortic valve disorder, unspecified: Secondary | ICD-10-CM

## 2011-09-19 DIAGNOSIS — Z7901 Long term (current) use of anticoagulants: Secondary | ICD-10-CM

## 2011-09-19 LAB — POCT INR: INR: 2.9

## 2011-10-31 ENCOUNTER — Ambulatory Visit (INDEPENDENT_AMBULATORY_CARE_PROVIDER_SITE_OTHER): Payer: BC Managed Care – PPO | Admitting: *Deleted

## 2011-10-31 DIAGNOSIS — Z7901 Long term (current) use of anticoagulants: Secondary | ICD-10-CM

## 2011-10-31 DIAGNOSIS — I359 Nonrheumatic aortic valve disorder, unspecified: Secondary | ICD-10-CM

## 2011-10-31 DIAGNOSIS — Z954 Presence of other heart-valve replacement: Secondary | ICD-10-CM

## 2011-10-31 LAB — POCT INR: INR: 2.7

## 2011-12-05 ENCOUNTER — Ambulatory Visit (INDEPENDENT_AMBULATORY_CARE_PROVIDER_SITE_OTHER): Payer: BC Managed Care – PPO | Admitting: *Deleted

## 2011-12-05 DIAGNOSIS — Z7901 Long term (current) use of anticoagulants: Secondary | ICD-10-CM

## 2011-12-05 DIAGNOSIS — I359 Nonrheumatic aortic valve disorder, unspecified: Secondary | ICD-10-CM

## 2011-12-05 DIAGNOSIS — Z954 Presence of other heart-valve replacement: Secondary | ICD-10-CM

## 2011-12-05 LAB — POCT INR: INR: 2.5

## 2011-12-16 ENCOUNTER — Other Ambulatory Visit: Payer: Self-pay | Admitting: Cardiology

## 2012-01-16 ENCOUNTER — Ambulatory Visit (INDEPENDENT_AMBULATORY_CARE_PROVIDER_SITE_OTHER): Payer: BC Managed Care – PPO | Admitting: *Deleted

## 2012-01-16 DIAGNOSIS — I359 Nonrheumatic aortic valve disorder, unspecified: Secondary | ICD-10-CM

## 2012-01-16 DIAGNOSIS — Z7901 Long term (current) use of anticoagulants: Secondary | ICD-10-CM

## 2012-01-16 DIAGNOSIS — Z954 Presence of other heart-valve replacement: Secondary | ICD-10-CM

## 2012-01-16 LAB — POCT INR: INR: 2.6

## 2012-02-20 DIAGNOSIS — E559 Vitamin D deficiency, unspecified: Secondary | ICD-10-CM | POA: Insufficient documentation

## 2012-02-20 DIAGNOSIS — R972 Elevated prostate specific antigen [PSA]: Secondary | ICD-10-CM | POA: Insufficient documentation

## 2012-02-26 ENCOUNTER — Ambulatory Visit (INDEPENDENT_AMBULATORY_CARE_PROVIDER_SITE_OTHER): Payer: BC Managed Care – PPO | Admitting: *Deleted

## 2012-02-26 ENCOUNTER — Encounter: Payer: Self-pay | Admitting: Cardiology

## 2012-02-26 ENCOUNTER — Ambulatory Visit (INDEPENDENT_AMBULATORY_CARE_PROVIDER_SITE_OTHER): Payer: BC Managed Care – PPO | Admitting: Cardiology

## 2012-02-26 VITALS — BP 130/80 | HR 52 | Ht 70.0 in | Wt 171.4 lb

## 2012-02-26 DIAGNOSIS — I350 Nonrheumatic aortic (valve) stenosis: Secondary | ICD-10-CM

## 2012-02-26 DIAGNOSIS — Z7901 Long term (current) use of anticoagulants: Secondary | ICD-10-CM

## 2012-02-26 DIAGNOSIS — Z954 Presence of other heart-valve replacement: Secondary | ICD-10-CM

## 2012-02-26 DIAGNOSIS — I359 Nonrheumatic aortic valve disorder, unspecified: Secondary | ICD-10-CM

## 2012-02-26 DIAGNOSIS — I10 Essential (primary) hypertension: Secondary | ICD-10-CM

## 2012-02-26 LAB — POCT INR: INR: 2.3

## 2012-02-26 NOTE — Patient Instructions (Signed)
Continue your current therapy  I will see you again in 6 months.   

## 2012-02-26 NOTE — Progress Notes (Signed)
   Nathan Frye Date of Birth: January 24, 1951   History of Present Illness: Nathan Frye is seen today for followup of his aortic valve disease. He continues to do very well. 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 He has had no significant symptoms of chest pain, shortness of breath, or palpitations. He remains very active. He denies any dizziness, or palpitations.  Current Outpatient Prescriptions on File Prior to Visit  Medication Sig Dispense Refill  . losartan-hydrochlorothiazide (HYZAAR) 50-12.5 MG per tablet Take 1 tablet by mouth daily.       . metoprolol tartrate (LOPRESSOR) 25 MG tablet Take 1 tablet (25 mg total) by mouth daily.  90 tablet  3  . rosuvastatin (CRESTOR) 10 MG tablet Take 1 tablet (10 mg total) by mouth daily.  90 tablet  3  . warfarin (COUMADIN) 5 MG tablet TAKE AS DIRECTED BY ANTICOAGULATION CLINIC.  35 tablet  3    No Known Allergies  Past Medical History  Diagnosis Date  . Severe aortic stenosis   . Chronic anticoagulation   . HTN (hypertension)   . Hypercholesteremia     Past Surgical History  Procedure Date  . Aortic valve replacement 2007    #21 MM [facility] VALVE  . Vasectomy     History  Smoking status  . Never Smoker   Smokeless tobacco  . Not on file    History  Alcohol Use No    Family History  Problem Relation Age of Onset  . Hypertension Mother     Review of Systems: As noted history of present illness.  All other systems were reviewed and are negative.  Physical Exam: BP 130/80  Pulse 52  Ht 5\' 10"  (1.778 m)  Wt 171 lb 6.4 oz (77.747 kg)  BMI 24.59 kg/m2 He is a pleasant, thin white male in no acute distress.The patient is alert and oriented x 3.    The HEENT exam is normal. The carotids are 2+ without bruits.  There is no thyromegaly.  There is no JVD.  The lungs are clear.   The heart exam reveals a regular rate with a normal S1. He has a good mechanical aortic valve click. There  is no murmur or S3. The PMI is not displaced.   Abdominal exam reveals good bowel sounds.  There is no hepatosplenomegaly or tenderness.   Exam of the legs reveal no clubbing, cyanosis, or edema.The distal pulses are intact.  Cranial nerves II - XII are intact.  Motor and sensory functions are intact.  The gait is normal.  LABORATORY DATA: INR is 2.3 today.  Assessment / Plan:  1. Status post mechanical aortic valve replacement. Clinically stable. Continue chronic anticoagulation. Routine SBE prophylaxis.  2. Hypertension, controlled. Continue Hyzaar and metoprolol. Next  3. Hypercholesterolemia-on Crestor. He's had recent lab work with his primary care. We will request a copy.

## 2012-03-28 ENCOUNTER — Other Ambulatory Visit: Payer: Self-pay

## 2012-03-31 ENCOUNTER — Ambulatory Visit (INDEPENDENT_AMBULATORY_CARE_PROVIDER_SITE_OTHER): Payer: BC Managed Care – PPO | Admitting: *Deleted

## 2012-03-31 DIAGNOSIS — Z7901 Long term (current) use of anticoagulants: Secondary | ICD-10-CM

## 2012-03-31 DIAGNOSIS — Z954 Presence of other heart-valve replacement: Secondary | ICD-10-CM

## 2012-03-31 DIAGNOSIS — I359 Nonrheumatic aortic valve disorder, unspecified: Secondary | ICD-10-CM

## 2012-03-31 LAB — POCT INR: INR: 2.6

## 2012-04-30 ENCOUNTER — Ambulatory Visit (INDEPENDENT_AMBULATORY_CARE_PROVIDER_SITE_OTHER): Payer: BC Managed Care – PPO

## 2012-04-30 ENCOUNTER — Other Ambulatory Visit: Payer: Self-pay | Admitting: Cardiology

## 2012-04-30 DIAGNOSIS — I359 Nonrheumatic aortic valve disorder, unspecified: Secondary | ICD-10-CM

## 2012-04-30 DIAGNOSIS — Z954 Presence of other heart-valve replacement: Secondary | ICD-10-CM

## 2012-04-30 DIAGNOSIS — Z7901 Long term (current) use of anticoagulants: Secondary | ICD-10-CM

## 2012-04-30 LAB — POCT INR: INR: 2.9

## 2012-05-26 ENCOUNTER — Ambulatory Visit (INDEPENDENT_AMBULATORY_CARE_PROVIDER_SITE_OTHER): Payer: BC Managed Care – PPO | Admitting: *Deleted

## 2012-05-26 DIAGNOSIS — I359 Nonrheumatic aortic valve disorder, unspecified: Secondary | ICD-10-CM

## 2012-05-26 DIAGNOSIS — Z7901 Long term (current) use of anticoagulants: Secondary | ICD-10-CM

## 2012-05-26 DIAGNOSIS — Z954 Presence of other heart-valve replacement: Secondary | ICD-10-CM

## 2012-05-26 LAB — POCT INR: INR: 2.4

## 2012-06-04 ENCOUNTER — Other Ambulatory Visit: Payer: Self-pay | Admitting: Cardiology

## 2012-06-16 ENCOUNTER — Ambulatory Visit (INDEPENDENT_AMBULATORY_CARE_PROVIDER_SITE_OTHER): Payer: BC Managed Care – PPO | Admitting: *Deleted

## 2012-06-16 DIAGNOSIS — Z7901 Long term (current) use of anticoagulants: Secondary | ICD-10-CM

## 2012-06-16 DIAGNOSIS — I359 Nonrheumatic aortic valve disorder, unspecified: Secondary | ICD-10-CM

## 2012-06-16 DIAGNOSIS — Z954 Presence of other heart-valve replacement: Secondary | ICD-10-CM

## 2012-06-16 LAB — POCT INR: INR: 2.5

## 2012-06-16 NOTE — Patient Instructions (Signed)
When MD states its OK for you to start your coumadin back take an extra 1/2 tablet for 2 days, then resume prior dose

## 2012-06-26 ENCOUNTER — Encounter: Payer: Self-pay | Admitting: Cardiology

## 2012-07-10 ENCOUNTER — Ambulatory Visit (INDEPENDENT_AMBULATORY_CARE_PROVIDER_SITE_OTHER): Payer: BC Managed Care – PPO | Admitting: *Deleted

## 2012-07-10 DIAGNOSIS — Z954 Presence of other heart-valve replacement: Secondary | ICD-10-CM

## 2012-07-10 DIAGNOSIS — I359 Nonrheumatic aortic valve disorder, unspecified: Secondary | ICD-10-CM

## 2012-07-10 DIAGNOSIS — Z7901 Long term (current) use of anticoagulants: Secondary | ICD-10-CM

## 2012-07-10 LAB — POCT INR: INR: 2.6

## 2012-08-07 ENCOUNTER — Ambulatory Visit (INDEPENDENT_AMBULATORY_CARE_PROVIDER_SITE_OTHER): Payer: BC Managed Care – PPO | Admitting: *Deleted

## 2012-08-07 DIAGNOSIS — Z7901 Long term (current) use of anticoagulants: Secondary | ICD-10-CM

## 2012-08-07 DIAGNOSIS — Z954 Presence of other heart-valve replacement: Secondary | ICD-10-CM

## 2012-08-07 DIAGNOSIS — I359 Nonrheumatic aortic valve disorder, unspecified: Secondary | ICD-10-CM

## 2012-08-07 LAB — POCT INR: INR: 2.4

## 2012-09-02 ENCOUNTER — Other Ambulatory Visit: Payer: Self-pay | Admitting: Cardiology

## 2012-09-07 ENCOUNTER — Ambulatory Visit (INDEPENDENT_AMBULATORY_CARE_PROVIDER_SITE_OTHER): Payer: BC Managed Care – PPO | Admitting: Cardiology

## 2012-09-07 ENCOUNTER — Encounter: Payer: Self-pay | Admitting: Cardiology

## 2012-09-07 ENCOUNTER — Ambulatory Visit (INDEPENDENT_AMBULATORY_CARE_PROVIDER_SITE_OTHER): Payer: BC Managed Care – PPO | Admitting: *Deleted

## 2012-09-07 VITALS — BP 136/70 | HR 62 | Ht 70.0 in | Wt 170.0 lb

## 2012-09-07 DIAGNOSIS — Z7901 Long term (current) use of anticoagulants: Secondary | ICD-10-CM

## 2012-09-07 DIAGNOSIS — I359 Nonrheumatic aortic valve disorder, unspecified: Secondary | ICD-10-CM

## 2012-09-07 DIAGNOSIS — Z954 Presence of other heart-valve replacement: Secondary | ICD-10-CM

## 2012-09-07 DIAGNOSIS — E7800 Pure hypercholesterolemia, unspecified: Secondary | ICD-10-CM

## 2012-09-07 DIAGNOSIS — Z952 Presence of prosthetic heart valve: Secondary | ICD-10-CM | POA: Insufficient documentation

## 2012-09-07 DIAGNOSIS — I10 Essential (primary) hypertension: Secondary | ICD-10-CM

## 2012-09-07 DIAGNOSIS — I350 Nonrheumatic aortic (valve) stenosis: Secondary | ICD-10-CM

## 2012-09-07 LAB — POCT INR: INR: 3.1

## 2012-09-07 NOTE — Progress Notes (Signed)
   Nathan Frye Date of Birth: 17-Jan-1951   History of Present Illness: Nathan Frye is seen today for followup of his aortic valve disease. He continues to do very well. 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 He has had no significant symptoms of chest pain, shortness of breath, or palpitations. He continues to exercise regularly. He did undergo a prostate biopsy earlier this year which was negative.  Current Outpatient Prescriptions on File Prior to Visit  Medication Sig Dispense Refill  . CRESTOR 10 MG tablet TAKE 1 TABLET (10 MG TOTAL) BY MOUTH DAILY.  90 tablet  3  . ergocalciferol (VITAMIN D2) 50000 UNITS capsule Take 50,000 Units by mouth once a week.      . losartan-hydrochlorothiazide (HYZAAR) 50-12.5 MG per tablet Take 1 tablet by mouth daily.       . metoprolol tartrate (LOPRESSOR) 25 MG tablet TAKE 1 TABLET BY MOUTH EVERY DAY  90 tablet  3  . warfarin (COUMADIN) 5 MG tablet TAKE AS DIRECTED BY ANTICOAGULATION CLINIC  35 tablet  3   No current facility-administered medications on file prior to visit.    No Known Allergies  Past Medical History  Diagnosis Date  . Severe aortic stenosis   . Chronic anticoagulation   . HTN (hypertension)   . Hypercholesteremia     Past Surgical History  Procedure Laterality Date  . Aortic valve replacement  2007    #21 MM [facility] VALVE  . Vasectomy      History  Smoking status  . Never Smoker   Smokeless tobacco  . Not on file    History  Alcohol Use No    Family History  Problem Relation Age of Onset  . Hypertension Mother     Review of Systems: As noted history of present illness.  All other systems were reviewed and are negative.  Physical Exam: BP 136/70  Pulse 62  Ht 5\' 10"  (1.778 m)  Wt 170 lb (77.111 kg)  BMI 24.39 kg/m2  SpO2 97% He is a pleasant, thin white male in no acute distress.The patient is alert and oriented x 3.    The HEENT exam is normal. The carotids  are 2+ without bruits.  There is no thyromegaly.  There is no JVD.  The lungs are clear.   The heart exam reveals a regular rate with a normal S1. He has a good mechanical aortic valve click. There is no murmur or S3. The PMI is not displaced.   Abdominal exam reveals good bowel sounds.  There is no hepatosplenomegaly or tenderness.   Exam of the legs reveal no clubbing, cyanosis, or edema.The distal pulses are intact.  Cranial nerves II - XII are intact.  Motor and sensory functions are intact.  The gait is normal.  LABORATORY DATA: INR is 3.1 today. Laboratory data was reviewed from his primary care in January 2014. Hemoglobin was 12.1. All chemistries were normal. TSH was normal. Lipid panel was not done. ECG today demonstrates normal sinus rhythm with LVH by voltage. Otherwise normal. Assessment / Plan:  1. Status post mechanical aortic valve replacement. Clinically stable. Continue chronic anticoagulation. Routine SBE prophylaxis. I will followup again in 3 months.  2. Hypertension, controlled. Continue Hyzaar and metoprolol.   3. Hypercholesterolemia-on Crestor. Since her lipid panel was not checked on his last lab work I have recommended that he have this done.

## 2012-09-07 NOTE — Patient Instructions (Signed)
Get your fasting lipids checked.  Otherwise continue your current therapy  I will see you again in 6 months

## 2012-10-05 ENCOUNTER — Ambulatory Visit (INDEPENDENT_AMBULATORY_CARE_PROVIDER_SITE_OTHER): Payer: BC Managed Care – PPO | Admitting: *Deleted

## 2012-10-05 DIAGNOSIS — Z7901 Long term (current) use of anticoagulants: Secondary | ICD-10-CM

## 2012-10-05 DIAGNOSIS — Z954 Presence of other heart-valve replacement: Secondary | ICD-10-CM

## 2012-10-05 DIAGNOSIS — I359 Nonrheumatic aortic valve disorder, unspecified: Secondary | ICD-10-CM

## 2012-10-05 LAB — POCT INR: INR: 3

## 2012-11-09 ENCOUNTER — Ambulatory Visit (INDEPENDENT_AMBULATORY_CARE_PROVIDER_SITE_OTHER): Payer: BC Managed Care – PPO | Admitting: *Deleted

## 2012-11-09 DIAGNOSIS — Z7901 Long term (current) use of anticoagulants: Secondary | ICD-10-CM

## 2012-11-09 DIAGNOSIS — I359 Nonrheumatic aortic valve disorder, unspecified: Secondary | ICD-10-CM

## 2012-11-09 DIAGNOSIS — Z954 Presence of other heart-valve replacement: Secondary | ICD-10-CM

## 2012-11-09 LAB — POCT INR: INR: 3.9

## 2012-12-07 ENCOUNTER — Ambulatory Visit (INDEPENDENT_AMBULATORY_CARE_PROVIDER_SITE_OTHER): Payer: BC Managed Care – PPO | Admitting: Pharmacist

## 2012-12-07 DIAGNOSIS — I359 Nonrheumatic aortic valve disorder, unspecified: Secondary | ICD-10-CM

## 2012-12-07 DIAGNOSIS — Z7901 Long term (current) use of anticoagulants: Secondary | ICD-10-CM

## 2012-12-07 DIAGNOSIS — Z954 Presence of other heart-valve replacement: Secondary | ICD-10-CM

## 2012-12-07 LAB — POCT INR: INR: 3.9

## 2012-12-28 ENCOUNTER — Ambulatory Visit (INDEPENDENT_AMBULATORY_CARE_PROVIDER_SITE_OTHER): Payer: BC Managed Care – PPO | Admitting: *Deleted

## 2012-12-28 DIAGNOSIS — Z954 Presence of other heart-valve replacement: Secondary | ICD-10-CM

## 2012-12-28 DIAGNOSIS — I359 Nonrheumatic aortic valve disorder, unspecified: Secondary | ICD-10-CM

## 2012-12-28 DIAGNOSIS — Z7901 Long term (current) use of anticoagulants: Secondary | ICD-10-CM

## 2012-12-28 LAB — POCT INR: INR: 4.1

## 2013-01-11 ENCOUNTER — Ambulatory Visit (INDEPENDENT_AMBULATORY_CARE_PROVIDER_SITE_OTHER): Payer: BC Managed Care – PPO | Admitting: *Deleted

## 2013-01-11 DIAGNOSIS — I359 Nonrheumatic aortic valve disorder, unspecified: Secondary | ICD-10-CM

## 2013-01-11 DIAGNOSIS — Z7901 Long term (current) use of anticoagulants: Secondary | ICD-10-CM

## 2013-01-11 DIAGNOSIS — Z954 Presence of other heart-valve replacement: Secondary | ICD-10-CM

## 2013-01-11 LAB — POCT INR: INR: 2.2

## 2013-01-21 ENCOUNTER — Other Ambulatory Visit: Payer: Self-pay | Admitting: Cardiology

## 2013-01-26 ENCOUNTER — Ambulatory Visit (INDEPENDENT_AMBULATORY_CARE_PROVIDER_SITE_OTHER): Payer: BC Managed Care – PPO | Admitting: General Practice

## 2013-01-26 DIAGNOSIS — Z954 Presence of other heart-valve replacement: Secondary | ICD-10-CM

## 2013-01-26 DIAGNOSIS — Z7901 Long term (current) use of anticoagulants: Secondary | ICD-10-CM

## 2013-01-26 DIAGNOSIS — I359 Nonrheumatic aortic valve disorder, unspecified: Secondary | ICD-10-CM

## 2013-01-26 LAB — POCT INR: INR: 3.5

## 2013-02-16 ENCOUNTER — Ambulatory Visit (INDEPENDENT_AMBULATORY_CARE_PROVIDER_SITE_OTHER): Payer: BC Managed Care – PPO | Admitting: *Deleted

## 2013-02-16 DIAGNOSIS — I359 Nonrheumatic aortic valve disorder, unspecified: Secondary | ICD-10-CM

## 2013-02-16 DIAGNOSIS — Z954 Presence of other heart-valve replacement: Secondary | ICD-10-CM

## 2013-02-16 DIAGNOSIS — Z7901 Long term (current) use of anticoagulants: Secondary | ICD-10-CM

## 2013-02-16 LAB — POCT INR: INR: 2.8

## 2013-03-11 ENCOUNTER — Ambulatory Visit (INDEPENDENT_AMBULATORY_CARE_PROVIDER_SITE_OTHER): Payer: BC Managed Care – PPO | Admitting: Cardiology

## 2013-03-11 ENCOUNTER — Encounter: Payer: Self-pay | Admitting: Cardiology

## 2013-03-11 ENCOUNTER — Ambulatory Visit (INDEPENDENT_AMBULATORY_CARE_PROVIDER_SITE_OTHER): Payer: BC Managed Care – PPO | Admitting: Pharmacist

## 2013-03-11 VITALS — BP 142/78 | HR 70 | Ht 70.0 in | Wt 171.8 lb

## 2013-03-11 DIAGNOSIS — I350 Nonrheumatic aortic (valve) stenosis: Secondary | ICD-10-CM

## 2013-03-11 DIAGNOSIS — Z7901 Long term (current) use of anticoagulants: Secondary | ICD-10-CM

## 2013-03-11 DIAGNOSIS — Z5181 Encounter for therapeutic drug level monitoring: Secondary | ICD-10-CM | POA: Insufficient documentation

## 2013-03-11 DIAGNOSIS — Z954 Presence of other heart-valve replacement: Secondary | ICD-10-CM

## 2013-03-11 DIAGNOSIS — I10 Essential (primary) hypertension: Secondary | ICD-10-CM

## 2013-03-11 DIAGNOSIS — E7800 Pure hypercholesterolemia, unspecified: Secondary | ICD-10-CM

## 2013-03-11 DIAGNOSIS — I359 Nonrheumatic aortic valve disorder, unspecified: Secondary | ICD-10-CM

## 2013-03-11 LAB — POCT INR: INR: 3.7

## 2013-03-11 NOTE — Patient Instructions (Signed)
Check with Dr. Thea Silversmith about having your liver function and lipids checked.  Continue your current therapy  I will see you in 6 months.

## 2013-03-11 NOTE — Progress Notes (Signed)
   Nathan Frye Date of Birth: 08/26/1950   History of Present Illness: Nathan Frye is seen today for followup of his aortic valve disease. He continues to do very well. 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 He has had no significant symptoms of chest pain, shortness of breath, or palpitations. 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  Current Outpatient Prescriptions on File Prior to Visit  Medication Sig Dispense Refill  . CRESTOR 10 MG tablet TAKE 1 TABLET (10 MG TOTAL) BY MOUTH DAILY.  90 tablet  3  . ergocalciferol (VITAMIN D2) 50000 UNITS capsule Take 50,000 Units by mouth once a week.      . losartan-hydrochlorothiazide (HYZAAR) 50-12.5 MG per tablet Take 1 tablet by mouth daily.       . metoprolol tartrate (LOPRESSOR) 25 MG tablet TAKE 1 TABLET BY MOUTH EVERY DAY  90 tablet  3  . warfarin (COUMADIN) 5 MG tablet TAKE AS DIRECTED BY ANTICOAGULATION CLINIC  35 tablet  3   No current facility-administered medications on file prior to visit.    No Known Allergies  Past Medical History  Diagnosis Date  . Severe aortic stenosis   . Chronic anticoagulation   . HTN (hypertension)   . Hypercholesteremia     Past Surgical History  Procedure Laterality Date  . Aortic valve replacement  2007    #21 MM [facility] VALVE  . Vasectomy      History  Smoking status  . Never Smoker   Smokeless tobacco  . Not on file    History  Alcohol Use No    Family History  Problem Relation Age of Onset  . Hypertension Mother     Review of Systems: As noted history of present illness.  All other systems were reviewed and are negative.  Physical Exam: BP 142/78  Pulse 70  Ht 5\' 10"  (1.778 m)  Wt 171 lb 12.8 oz (77.928 kg)  BMI 24.65 kg/m2  SpO2 99% He is a pleasant, thin white male in no acute distress.The patient is alert and oriented x 3.    The HEENT exam is normal. The carotids are 2+ without bruits.  There is no  thyromegaly.  There is no JVD.  The lungs are clear.   The heart exam reveals a regular rate with a normal S1. He has a good mechanical aortic valve click. There is no murmur or S3. The PMI is not displaced.   Abdominal exam reveals good bowel sounds.  There is no hepatosplenomegaly or tenderness.   Exam of the legs reveal no clubbing, cyanosis, or edema.The distal pulses are intact.  Cranial nerves II - XII are intact.  Motor and sensory functions are intact.  The gait is normal.  LABORATORY DATA: INR is 3.7 today.  Assessment / Plan:  1. Status post mechanical aortic valve replacement. Clinically stable. Continue chronic anticoagulation. Routine SBE prophylaxis. I will followup again in 6 months.  2. Hypertension, controlled. Continue Hyzaar and metoprolol.   3. Hypercholesterolemia-on Crestor. Recommended that he have lipids and HFP checked with next visit with Dr. Thea Silversmith.

## 2013-04-01 ENCOUNTER — Other Ambulatory Visit: Payer: Self-pay | Admitting: Cardiology

## 2013-04-01 ENCOUNTER — Ambulatory Visit (INDEPENDENT_AMBULATORY_CARE_PROVIDER_SITE_OTHER): Payer: BC Managed Care – PPO

## 2013-04-01 DIAGNOSIS — Z5181 Encounter for therapeutic drug level monitoring: Secondary | ICD-10-CM

## 2013-04-01 DIAGNOSIS — Z954 Presence of other heart-valve replacement: Secondary | ICD-10-CM

## 2013-04-01 DIAGNOSIS — I359 Nonrheumatic aortic valve disorder, unspecified: Secondary | ICD-10-CM

## 2013-04-01 DIAGNOSIS — Z7901 Long term (current) use of anticoagulants: Secondary | ICD-10-CM

## 2013-04-01 LAB — POCT INR: INR: 3.6

## 2013-04-29 ENCOUNTER — Ambulatory Visit (INDEPENDENT_AMBULATORY_CARE_PROVIDER_SITE_OTHER): Payer: BC Managed Care – PPO | Admitting: *Deleted

## 2013-04-29 DIAGNOSIS — Z7901 Long term (current) use of anticoagulants: Secondary | ICD-10-CM

## 2013-04-29 DIAGNOSIS — Z5181 Encounter for therapeutic drug level monitoring: Secondary | ICD-10-CM

## 2013-04-29 DIAGNOSIS — I359 Nonrheumatic aortic valve disorder, unspecified: Secondary | ICD-10-CM

## 2013-04-29 DIAGNOSIS — Z954 Presence of other heart-valve replacement: Secondary | ICD-10-CM

## 2013-04-29 LAB — POCT INR: INR: 2.7

## 2013-05-27 ENCOUNTER — Ambulatory Visit (INDEPENDENT_AMBULATORY_CARE_PROVIDER_SITE_OTHER): Payer: BC Managed Care – PPO | Admitting: Pharmacist Clinician (PhC)/ Clinical Pharmacy Specialist

## 2013-05-27 DIAGNOSIS — Z7901 Long term (current) use of anticoagulants: Secondary | ICD-10-CM

## 2013-05-27 DIAGNOSIS — Z5181 Encounter for therapeutic drug level monitoring: Secondary | ICD-10-CM

## 2013-05-27 DIAGNOSIS — I359 Nonrheumatic aortic valve disorder, unspecified: Secondary | ICD-10-CM

## 2013-05-27 DIAGNOSIS — Z954 Presence of other heart-valve replacement: Secondary | ICD-10-CM

## 2013-05-27 LAB — POCT INR: INR: 3

## 2013-06-04 ENCOUNTER — Other Ambulatory Visit: Payer: Self-pay | Admitting: Cardiology

## 2013-06-24 ENCOUNTER — Ambulatory Visit (INDEPENDENT_AMBULATORY_CARE_PROVIDER_SITE_OTHER): Payer: BC Managed Care – PPO | Admitting: Pharmacist Clinician (PhC)/ Clinical Pharmacy Specialist

## 2013-06-24 DIAGNOSIS — I359 Nonrheumatic aortic valve disorder, unspecified: Secondary | ICD-10-CM

## 2013-06-24 DIAGNOSIS — Z5181 Encounter for therapeutic drug level monitoring: Secondary | ICD-10-CM

## 2013-06-24 DIAGNOSIS — Z954 Presence of other heart-valve replacement: Secondary | ICD-10-CM

## 2013-06-24 DIAGNOSIS — Z7901 Long term (current) use of anticoagulants: Secondary | ICD-10-CM

## 2013-06-24 LAB — POCT INR: INR: 4

## 2013-07-08 ENCOUNTER — Ambulatory Visit (INDEPENDENT_AMBULATORY_CARE_PROVIDER_SITE_OTHER): Payer: BC Managed Care – PPO | Admitting: Pharmacist Clinician (PhC)/ Clinical Pharmacy Specialist

## 2013-07-08 DIAGNOSIS — Z7901 Long term (current) use of anticoagulants: Secondary | ICD-10-CM

## 2013-07-08 DIAGNOSIS — Z954 Presence of other heart-valve replacement: Secondary | ICD-10-CM

## 2013-07-08 DIAGNOSIS — Z5181 Encounter for therapeutic drug level monitoring: Secondary | ICD-10-CM

## 2013-07-08 DIAGNOSIS — I359 Nonrheumatic aortic valve disorder, unspecified: Secondary | ICD-10-CM

## 2013-07-08 LAB — POCT INR: INR: 2.4

## 2013-08-05 ENCOUNTER — Ambulatory Visit (INDEPENDENT_AMBULATORY_CARE_PROVIDER_SITE_OTHER): Payer: BC Managed Care – PPO | Admitting: *Deleted

## 2013-08-05 DIAGNOSIS — Z954 Presence of other heart-valve replacement: Secondary | ICD-10-CM

## 2013-08-05 DIAGNOSIS — Z7901 Long term (current) use of anticoagulants: Secondary | ICD-10-CM

## 2013-08-05 DIAGNOSIS — I359 Nonrheumatic aortic valve disorder, unspecified: Secondary | ICD-10-CM

## 2013-08-05 DIAGNOSIS — Z5181 Encounter for therapeutic drug level monitoring: Secondary | ICD-10-CM

## 2013-08-05 LAB — POCT INR: INR: 3

## 2013-08-16 ENCOUNTER — Encounter: Payer: Self-pay | Admitting: Cardiology

## 2013-08-16 ENCOUNTER — Ambulatory Visit (INDEPENDENT_AMBULATORY_CARE_PROVIDER_SITE_OTHER): Payer: BC Managed Care – PPO | Admitting: Cardiology

## 2013-08-16 ENCOUNTER — Ambulatory Visit: Payer: BC Managed Care – PPO | Admitting: Cardiology

## 2013-08-16 VITALS — BP 142/88 | HR 56 | Ht 70.0 in | Wt 167.3 lb

## 2013-08-16 DIAGNOSIS — E7800 Pure hypercholesterolemia, unspecified: Secondary | ICD-10-CM

## 2013-08-16 DIAGNOSIS — Z952 Presence of prosthetic heart valve: Secondary | ICD-10-CM

## 2013-08-16 DIAGNOSIS — Z7901 Long term (current) use of anticoagulants: Secondary | ICD-10-CM

## 2013-08-16 DIAGNOSIS — Z954 Presence of other heart-valve replacement: Secondary | ICD-10-CM

## 2013-08-16 DIAGNOSIS — I10 Essential (primary) hypertension: Secondary | ICD-10-CM

## 2013-08-16 NOTE — Patient Instructions (Signed)
Continue your current therapy  We will get a copy of your lab work from Dr. Thea Silversmith  I will see you in 6 months

## 2013-08-16 NOTE — Progress Notes (Signed)
   Nathan Frye Date of Birth: 1950/04/04   History of Present Illness: Nathan Frye is seen today for followup of his aortic valve disease. 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 He denies any significant symptoms of chest pain, shortness of breath, or palpitations. 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 Not sure when his last lab work done.   Current Outpatient Prescriptions on File Prior to Visit  Medication Sig Dispense Refill  . CRESTOR 10 MG tablet TAKE 1 TABLET (10 MG TOTAL) BY MOUTH DAILY.  90 tablet  1  . ergocalciferol (VITAMIN D2) 50000 UNITS capsule Take 50,000 Units by mouth once a week.      . losartan-hydrochlorothiazide (HYZAAR) 50-12.5 MG per tablet Take 1 tablet by mouth daily.       . metoprolol tartrate (LOPRESSOR) 25 MG tablet TAKE 1 TABLET BY MOUTH EVERY DAY  90 tablet  1  . warfarin (COUMADIN) 5 MG tablet 1 tablet daily except 1.5 tablets on Mondays or as directed by coumadin clinic  35 tablet  3   No current facility-administered medications on file prior to visit.    No Known Allergies  Past Medical History  Diagnosis Date  . Severe aortic stenosis   . Chronic anticoagulation   . HTN (hypertension)   . Hypercholesteremia     Past Surgical History  Procedure Laterality Date  . Aortic valve replacement  2007    #21 MM [facility] VALVE  . Vasectomy      History  Smoking status  . Never Smoker   Smokeless tobacco  . Not on file    History  Alcohol Use No    Family History  Problem Relation Age of Onset  . Hypertension Mother     Review of Systems: As noted history of present illness.  All other systems were reviewed and are negative.  Physical Exam: BP 142/88  Pulse 56  Ht 5\' 10"  (1.778 m)  Wt 167 lb 4.8 oz (75.887 kg)  BMI 24.01 kg/m2 He is a pleasant, thin white male in no acute distress.The patient is alert and oriented x 3.    The HEENT  exam is normal. The carotids are 2+ without bruits.  There is no thyromegaly.  There is no JVD.  The lungs are clear.   The heart exam reveals a regular rate with a normal S1. He has a good mechanical aortic valve click. There is no murmur or S3. The PMI is not displaced.   Abdominal exam reveals good bowel sounds.  There is no hepatosplenomegaly or tenderness.   Exam of the legs reveal no clubbing, cyanosis, or edema.The distal pulses are intact.  Cranial nerves II - XII are intact.  Motor and sensory functions are intact.  The gait is normal.  LABORATORY DATA: INR is 3.0 last week.  Ecg: NSR, LVH by voltage.  Assessment / Plan:  1. Status post mechanical aortic valve replacement. Clinically stable. Continue chronic anticoagulation. Routine SBE prophylaxis. I will followup again in 6 months.  2. Hypertension, controlled. Continue Hyzaar and metoprolol.   3. Hypercholesterolemia-on Crestor.   I have requested a copy of his most recent lab work from primary care.

## 2013-09-10 ENCOUNTER — Ambulatory Visit (INDEPENDENT_AMBULATORY_CARE_PROVIDER_SITE_OTHER): Payer: BC Managed Care – PPO | Admitting: Pharmacist

## 2013-09-10 DIAGNOSIS — Z5181 Encounter for therapeutic drug level monitoring: Secondary | ICD-10-CM

## 2013-09-10 DIAGNOSIS — Z954 Presence of other heart-valve replacement: Secondary | ICD-10-CM

## 2013-09-10 DIAGNOSIS — I359 Nonrheumatic aortic valve disorder, unspecified: Secondary | ICD-10-CM

## 2013-09-10 DIAGNOSIS — Z7901 Long term (current) use of anticoagulants: Secondary | ICD-10-CM

## 2013-09-10 LAB — POCT INR: INR: 2.8

## 2013-10-07 ENCOUNTER — Other Ambulatory Visit: Payer: Self-pay | Admitting: Cardiology

## 2013-10-22 ENCOUNTER — Ambulatory Visit (INDEPENDENT_AMBULATORY_CARE_PROVIDER_SITE_OTHER): Payer: BC Managed Care – PPO | Admitting: *Deleted

## 2013-10-22 ENCOUNTER — Telehealth: Payer: Self-pay

## 2013-10-22 DIAGNOSIS — Z5181 Encounter for therapeutic drug level monitoring: Secondary | ICD-10-CM

## 2013-10-22 DIAGNOSIS — I359 Nonrheumatic aortic valve disorder, unspecified: Secondary | ICD-10-CM

## 2013-10-22 DIAGNOSIS — Z954 Presence of other heart-valve replacement: Secondary | ICD-10-CM

## 2013-10-22 DIAGNOSIS — Z7901 Long term (current) use of anticoagulants: Secondary | ICD-10-CM

## 2013-10-22 LAB — POCT INR: INR: 4.3

## 2013-10-22 NOTE — Telephone Encounter (Signed)
Spoke to patient Dr.Jordan advised ok to hold coumadin 5 days prior to colonoscopy.Note faxed to Dr.Mann at fax # [phone].

## 2013-10-25 ENCOUNTER — Telehealth: Payer: Self-pay

## 2013-10-25 NOTE — Telephone Encounter (Signed)
Message copied by Theophilus Kinds on Mon Oct 25, 2013 10:30 AM ------      Message from: Golden Hurter D      Created: Fri Oct 22, 2013  5:53 PM       Dr.Jordan advised ok for pt to hold coumadin 5 days prior to colonoscopy he does not need a bridge.I faxed order to Dr.Mann            Thanks Malachy Mood ------

## 2013-10-25 NOTE — Telephone Encounter (Signed)
Called spoke with pt advised per Dr [place], ok to hold Coumadin 5 days prior to colonoscopy on 11/19/13 without Lovenox bridging.  Advised pt last dosage of Coumadin prior to procedure will be on 11/13/13.  Pt is scheduled in Coumadin clinic on 11/03/13 for repeat INR will type out further instructions at that time.  Pt verbalized understanding.

## 2013-10-28 ENCOUNTER — Other Ambulatory Visit: Payer: Self-pay | Admitting: *Deleted

## 2013-10-28 MED ORDER — ROSUVASTATIN CALCIUM 10 MG PO TABS: ORAL_TABLET | ORAL | Status: DC

## 2013-11-03 ENCOUNTER — Ambulatory Visit (INDEPENDENT_AMBULATORY_CARE_PROVIDER_SITE_OTHER): Payer: BC Managed Care – PPO | Admitting: *Deleted

## 2013-11-03 DIAGNOSIS — Z5181 Encounter for therapeutic drug level monitoring: Secondary | ICD-10-CM

## 2013-11-03 DIAGNOSIS — Z954 Presence of other heart-valve replacement: Secondary | ICD-10-CM

## 2013-11-03 DIAGNOSIS — I359 Nonrheumatic aortic valve disorder, unspecified: Secondary | ICD-10-CM

## 2013-11-03 DIAGNOSIS — Z7901 Long term (current) use of anticoagulants: Secondary | ICD-10-CM

## 2013-11-03 LAB — POCT INR: INR: 3.2

## 2013-11-03 NOTE — Patient Instructions (Addendum)
11/13/13- Take last dose of Coumadin.  11/19/13- If [place] with Dr Collene Mares, restart after procedure. Take an extra 1/2  Tablet for 2 days along with your normal dosage then continue regular                  dose.  Call if other instructions are given from Dr Collene Mares # [phone]- Coumadin Clinic

## 2013-11-26 ENCOUNTER — Ambulatory Visit (INDEPENDENT_AMBULATORY_CARE_PROVIDER_SITE_OTHER): Payer: BC Managed Care – PPO | Admitting: *Deleted

## 2013-11-26 DIAGNOSIS — Z954 Presence of other heart-valve replacement: Secondary | ICD-10-CM

## 2013-11-26 DIAGNOSIS — Z5181 Encounter for therapeutic drug level monitoring: Secondary | ICD-10-CM

## 2013-11-26 DIAGNOSIS — Z7901 Long term (current) use of anticoagulants: Secondary | ICD-10-CM

## 2013-11-26 DIAGNOSIS — Z952 Presence of prosthetic heart valve: Secondary | ICD-10-CM

## 2013-11-26 DIAGNOSIS — I359 Nonrheumatic aortic valve disorder, unspecified: Secondary | ICD-10-CM

## 2013-11-26 LAB — POCT INR: INR: 2.5

## 2013-12-03 ENCOUNTER — Other Ambulatory Visit: Payer: Self-pay | Admitting: Physician Assistant

## 2013-12-03 ENCOUNTER — Telehealth: Payer: Self-pay | Admitting: Physician Assistant

## 2013-12-03 MED ORDER — METOPROLOL TARTRATE 25 MG PO TABS: ORAL_TABLET | ORAL | Status: DC

## 2013-12-03 NOTE — Telephone Encounter (Signed)
Patient called because he tried to refill his metoprolol at the drugstore. They stated it would have to go to [facility] and then stated it had been denied. He sees Dr. Martinique one or 2 times a year and is compliant with his appointments, medications, and Coumadin checks.  Advised the patient that I would call in a 30-day supply of this medication. Advised him that I could not call in more than that because he is on the short-acting form of the medication instead of the long-acting form, even though he is only taking it once per day.  Dr. Martinique to review and advise further. For now, continue current therapy.

## 2013-12-22 ENCOUNTER — Ambulatory Visit (INDEPENDENT_AMBULATORY_CARE_PROVIDER_SITE_OTHER): Payer: BC Managed Care – PPO | Admitting: Surgery

## 2013-12-22 DIAGNOSIS — I359 Nonrheumatic aortic valve disorder, unspecified: Secondary | ICD-10-CM

## 2013-12-22 DIAGNOSIS — Z5181 Encounter for therapeutic drug level monitoring: Secondary | ICD-10-CM

## 2013-12-22 DIAGNOSIS — Z954 Presence of other heart-valve replacement: Secondary | ICD-10-CM

## 2013-12-22 DIAGNOSIS — Z952 Presence of prosthetic heart valve: Secondary | ICD-10-CM

## 2013-12-22 DIAGNOSIS — Z7901 Long term (current) use of anticoagulants: Secondary | ICD-10-CM

## 2013-12-22 LAB — POCT INR: INR: 3

## 2014-01-19 ENCOUNTER — Ambulatory Visit (INDEPENDENT_AMBULATORY_CARE_PROVIDER_SITE_OTHER): Payer: BC Managed Care – PPO | Admitting: *Deleted

## 2014-01-19 DIAGNOSIS — I359 Nonrheumatic aortic valve disorder, unspecified: Secondary | ICD-10-CM

## 2014-01-19 DIAGNOSIS — Z5181 Encounter for therapeutic drug level monitoring: Secondary | ICD-10-CM

## 2014-01-19 DIAGNOSIS — Z952 Presence of prosthetic heart valve: Secondary | ICD-10-CM

## 2014-01-19 DIAGNOSIS — Z954 Presence of other heart-valve replacement: Secondary | ICD-10-CM

## 2014-01-19 DIAGNOSIS — Z7901 Long term (current) use of anticoagulants: Secondary | ICD-10-CM

## 2014-01-19 LAB — POCT INR: INR: 3.1

## 2014-01-29 ENCOUNTER — Other Ambulatory Visit: Payer: Self-pay | Admitting: Physician Assistant

## 2014-02-01 ENCOUNTER — Other Ambulatory Visit: Payer: Self-pay

## 2014-02-01 MED ORDER — ROSUVASTATIN CALCIUM 10 MG PO TABS: ORAL_TABLET | ORAL | Status: DC

## 2014-02-16 ENCOUNTER — Ambulatory Visit (INDEPENDENT_AMBULATORY_CARE_PROVIDER_SITE_OTHER): Payer: BC Managed Care – PPO | Admitting: Pharmacist

## 2014-02-16 DIAGNOSIS — Z954 Presence of other heart-valve replacement: Secondary | ICD-10-CM

## 2014-02-16 DIAGNOSIS — Z952 Presence of prosthetic heart valve: Secondary | ICD-10-CM

## 2014-02-16 DIAGNOSIS — Z7901 Long term (current) use of anticoagulants: Secondary | ICD-10-CM

## 2014-02-16 DIAGNOSIS — Z5181 Encounter for therapeutic drug level monitoring: Secondary | ICD-10-CM

## 2014-02-16 DIAGNOSIS — I359 Nonrheumatic aortic valve disorder, unspecified: Secondary | ICD-10-CM

## 2014-02-16 LAB — POCT INR: INR: 2.9

## 2014-02-17 ENCOUNTER — Ambulatory Visit (INDEPENDENT_AMBULATORY_CARE_PROVIDER_SITE_OTHER): Payer: BC Managed Care – PPO | Admitting: Cardiology

## 2014-02-17 ENCOUNTER — Encounter: Payer: Self-pay | Admitting: Cardiology

## 2014-02-17 VITALS — BP 126/64 | HR 64 | Ht 70.0 in | Wt 172.0 lb

## 2014-02-17 DIAGNOSIS — Z952 Presence of prosthetic heart valve: Secondary | ICD-10-CM

## 2014-02-17 DIAGNOSIS — I10 Essential (primary) hypertension: Secondary | ICD-10-CM

## 2014-02-17 DIAGNOSIS — Z7901 Long term (current) use of anticoagulants: Secondary | ICD-10-CM

## 2014-02-17 DIAGNOSIS — Z954 Presence of other heart-valve replacement: Secondary | ICD-10-CM

## 2014-02-17 NOTE — Progress Notes (Signed)
   Nathan Frye Date of Birth: 1950-02-21   History of Present Illness: Nathan Frye is seen today for followup of his aortic valve disease. 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 He denies any significant symptoms of chest pain, shortness of breath, or palpitations. 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 No complaints today.   Current Outpatient Prescriptions on File Prior to Visit  Medication Sig Dispense Refill  . losartan-hydrochlorothiazide (HYZAAR) 50-12.5 MG per tablet Take 1 tablet by mouth daily.     . metoprolol tartrate (LOPRESSOR) 25 MG tablet TAKE 1 TABLET BY MOUTH EVERY DAY 30 tablet 0  . rosuvastatin (CRESTOR) 10 MG tablet TAKE 1 TABLET (10 MG TOTAL) BY MOUTH DAILY. 90 tablet 0  . warfarin (COUMADIN) 5 MG tablet Take 1 to 1.5 tablets by mouth daily as directed by coumadin clinic 35 tablet 5   No current facility-administered medications on file prior to visit.    No Known Allergies  Past Medical History  Diagnosis Date  . Severe aortic stenosis   . Chronic anticoagulation   . HTN (hypertension)   . Hypercholesteremia     Past Surgical History  Procedure Laterality Date  . Aortic valve replacement  2007    #21 MM [facility] VALVE  . Vasectomy      History  Smoking status  . Never Smoker   Smokeless tobacco  . Not on file    History  Alcohol Use No    Family History  Problem Relation Age of Onset  . Hypertension Mother     Review of Systems: As noted history of present illness.  All other systems were reviewed and are negative.  Physical Exam: BP 126/64 mmHg  Pulse 64  Ht 5\' 10"  (1.778 m)  Wt 172 lb (78.019 kg)  BMI 24.68 kg/m2 He is a pleasant, thin white male in no acute distress.The patient is alert and oriented x 3.    The HEENT exam is normal. The carotids are 2+ without bruits.  There is no thyromegaly.  There is no JVD.  The lungs are clear.    The heart exam reveals a regular rate with a normal S1. He has a good mechanical aortic valve click. There is no murmur or S3. The PMI is not displaced.   Abdominal exam reveals good bowel sounds.  There is no hepatosplenomegaly or tenderness.   Exam of the legs reveal no clubbing, cyanosis, or edema.The distal pulses are intact.  Cranial nerves II - XII are intact.  Motor and sensory functions are intact.  The gait is normal.  LABORATORY DATA: INR is 2.9   Assessment / Plan:  1. Status post mechanical aortic valve replacement. Clinically stable. Continue chronic anticoagulation. Routine SBE prophylaxis. I will followup again in 6 months.  2. Hypertension, well controlled. Continue Hyzaar and metoprolol.   3. Hypercholesterolemia-on Crestor.

## 2014-02-17 NOTE — Patient Instructions (Signed)
Continue your current therapy  I will see you in 6 months.   

## 2014-03-02 ENCOUNTER — Other Ambulatory Visit: Payer: Self-pay

## 2014-03-02 MED ORDER — METOPROLOL TARTRATE 25 MG PO TABS: 25.0000 mg | ORAL_TABLET | Freq: Every day | ORAL | Status: DC

## 2014-03-17 ENCOUNTER — Ambulatory Visit (INDEPENDENT_AMBULATORY_CARE_PROVIDER_SITE_OTHER): Payer: BC Managed Care – PPO | Admitting: *Deleted

## 2014-03-17 DIAGNOSIS — Z952 Presence of prosthetic heart valve: Secondary | ICD-10-CM

## 2014-03-17 DIAGNOSIS — Z5181 Encounter for therapeutic drug level monitoring: Secondary | ICD-10-CM

## 2014-03-17 DIAGNOSIS — I359 Nonrheumatic aortic valve disorder, unspecified: Secondary | ICD-10-CM

## 2014-03-17 DIAGNOSIS — Z954 Presence of other heart-valve replacement: Secondary | ICD-10-CM

## 2014-03-17 DIAGNOSIS — Z7901 Long term (current) use of anticoagulants: Secondary | ICD-10-CM

## 2014-03-17 LAB — POCT INR: INR: 3

## 2014-04-12 ENCOUNTER — Ambulatory Visit (INDEPENDENT_AMBULATORY_CARE_PROVIDER_SITE_OTHER): Payer: BC Managed Care – PPO

## 2014-04-12 DIAGNOSIS — Z5181 Encounter for therapeutic drug level monitoring: Secondary | ICD-10-CM

## 2014-04-12 DIAGNOSIS — Z7901 Long term (current) use of anticoagulants: Secondary | ICD-10-CM

## 2014-04-12 DIAGNOSIS — Z952 Presence of prosthetic heart valve: Secondary | ICD-10-CM

## 2014-04-12 DIAGNOSIS — I359 Nonrheumatic aortic valve disorder, unspecified: Secondary | ICD-10-CM

## 2014-04-12 DIAGNOSIS — Z954 Presence of other heart-valve replacement: Secondary | ICD-10-CM

## 2014-04-12 LAB — POCT INR: INR: 2.4

## 2014-04-22 ENCOUNTER — Other Ambulatory Visit: Payer: Self-pay | Admitting: *Deleted

## 2014-04-22 MED ORDER — WARFARIN SODIUM 5 MG PO TABS: ORAL_TABLET | ORAL | Status: DC

## 2014-05-10 ENCOUNTER — Ambulatory Visit (INDEPENDENT_AMBULATORY_CARE_PROVIDER_SITE_OTHER): Payer: BC Managed Care – PPO

## 2014-05-10 ENCOUNTER — Telehealth: Payer: Self-pay | Admitting: Cardiology

## 2014-05-10 DIAGNOSIS — Z7901 Long term (current) use of anticoagulants: Secondary | ICD-10-CM

## 2014-05-10 DIAGNOSIS — I359 Nonrheumatic aortic valve disorder, unspecified: Secondary | ICD-10-CM

## 2014-05-10 DIAGNOSIS — Z5181 Encounter for therapeutic drug level monitoring: Secondary | ICD-10-CM

## 2014-05-10 DIAGNOSIS — Z954 Presence of other heart-valve replacement: Secondary | ICD-10-CM

## 2014-05-10 DIAGNOSIS — Z952 Presence of prosthetic heart valve: Secondary | ICD-10-CM

## 2014-05-10 LAB — POCT INR: INR: 2.9

## 2014-05-10 MED ORDER — ROSUVASTATIN CALCIUM 10 MG PO TABS: ORAL_TABLET | ORAL | Status: DC

## 2014-05-10 NOTE — Telephone Encounter (Signed)
Pt called in stating that he needs his Crestor refilled and sent to the [facility] on [address]  Thanks

## 2014-05-10 NOTE — Telephone Encounter (Signed)
Returned call to patient 30 day refill for Crestor sent to pharmacy.

## 2014-06-14 ENCOUNTER — Ambulatory Visit (INDEPENDENT_AMBULATORY_CARE_PROVIDER_SITE_OTHER): Payer: BC Managed Care – PPO | Admitting: *Deleted

## 2014-06-14 DIAGNOSIS — Z954 Presence of other heart-valve replacement: Secondary | ICD-10-CM

## 2014-06-14 DIAGNOSIS — Z952 Presence of prosthetic heart valve: Secondary | ICD-10-CM

## 2014-06-14 DIAGNOSIS — Z5181 Encounter for therapeutic drug level monitoring: Secondary | ICD-10-CM

## 2014-06-14 DIAGNOSIS — Z7901 Long term (current) use of anticoagulants: Secondary | ICD-10-CM

## 2014-06-14 DIAGNOSIS — I359 Nonrheumatic aortic valve disorder, unspecified: Secondary | ICD-10-CM | POA: Diagnosis not present

## 2014-06-14 LAB — POCT INR: INR: 3.2

## 2014-07-12 ENCOUNTER — Ambulatory Visit (INDEPENDENT_AMBULATORY_CARE_PROVIDER_SITE_OTHER): Payer: BC Managed Care – PPO

## 2014-07-12 DIAGNOSIS — Z7901 Long term (current) use of anticoagulants: Secondary | ICD-10-CM | POA: Diagnosis not present

## 2014-07-12 DIAGNOSIS — Z954 Presence of other heart-valve replacement: Secondary | ICD-10-CM

## 2014-07-12 DIAGNOSIS — I359 Nonrheumatic aortic valve disorder, unspecified: Secondary | ICD-10-CM

## 2014-07-12 DIAGNOSIS — Z952 Presence of prosthetic heart valve: Secondary | ICD-10-CM

## 2014-07-12 DIAGNOSIS — Z5181 Encounter for therapeutic drug level monitoring: Secondary | ICD-10-CM

## 2014-07-12 LAB — POCT INR: INR: 3.2

## 2014-08-09 ENCOUNTER — Ambulatory Visit (INDEPENDENT_AMBULATORY_CARE_PROVIDER_SITE_OTHER): Payer: BC Managed Care – PPO

## 2014-08-09 DIAGNOSIS — Z954 Presence of other heart-valve replacement: Secondary | ICD-10-CM

## 2014-08-09 DIAGNOSIS — Z5181 Encounter for therapeutic drug level monitoring: Secondary | ICD-10-CM | POA: Diagnosis not present

## 2014-08-09 DIAGNOSIS — I359 Nonrheumatic aortic valve disorder, unspecified: Secondary | ICD-10-CM | POA: Diagnosis not present

## 2014-08-09 DIAGNOSIS — Z7901 Long term (current) use of anticoagulants: Secondary | ICD-10-CM

## 2014-08-09 DIAGNOSIS — Z952 Presence of prosthetic heart valve: Secondary | ICD-10-CM

## 2014-08-09 LAB — POCT INR: INR: 3

## 2014-08-19 ENCOUNTER — Encounter: Payer: Self-pay | Admitting: Cardiology

## 2014-08-19 ENCOUNTER — Ambulatory Visit (INDEPENDENT_AMBULATORY_CARE_PROVIDER_SITE_OTHER): Payer: BC Managed Care – PPO | Admitting: Cardiology

## 2014-08-19 VITALS — BP 140/84 | HR 59 | Ht 70.0 in | Wt 170.3 lb

## 2014-08-19 DIAGNOSIS — Z954 Presence of other heart-valve replacement: Secondary | ICD-10-CM

## 2014-08-19 DIAGNOSIS — Z7901 Long term (current) use of anticoagulants: Secondary | ICD-10-CM | POA: Diagnosis not present

## 2014-08-19 DIAGNOSIS — Z5181 Encounter for therapeutic drug level monitoring: Secondary | ICD-10-CM

## 2014-08-19 DIAGNOSIS — Z952 Presence of prosthetic heart valve: Secondary | ICD-10-CM

## 2014-08-19 NOTE — Patient Instructions (Signed)
Continue your current therapy  I will see you in 6 months.   

## 2014-08-19 NOTE — Progress Notes (Signed)
   Nathan Frye Date of Birth: 03/20/1950   History of Present Illness: Nathan Frye is seen today for followup of his aortic valve disease. 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 Last Echo in 2012 showed normal valve function. He denies any significant symptoms of chest pain, shortness of breath, or palpitations. 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 No complaints today.   Current Outpatient Prescriptions on File Prior to Visit  Medication Sig Dispense Refill  . Ergocalciferol 2000 UNITS TABS Take 2,000 Units by mouth daily.    Marland Kitchen losartan-hydrochlorothiazide (HYZAAR) 50-12.5 MG per tablet Take 1 tablet by mouth daily.     . metoprolol tartrate (LOPRESSOR) 25 MG tablet Take 1 tablet (25 mg total) by mouth daily. 30 tablet 6  . rosuvastatin (CRESTOR) 10 MG tablet TAKE 1 TABLET (10 MG TOTAL) BY MOUTH DAILY. 30 tablet 6  . warfarin (COUMADIN) 5 MG tablet Take 1 to 1.5 tablets by mouth daily as directed by coumadin clinic 35 tablet 4   No current facility-administered medications on file prior to visit.    No Known Allergies  Past Medical History  Diagnosis Date  . Severe aortic stenosis   . Chronic anticoagulation   . HTN (hypertension)   . Hypercholesteremia     Past Surgical History  Procedure Laterality Date  . Aortic valve replacement  2007    #21 MM [facility] VALVE  . Vasectomy      History  Smoking status  . Never Smoker   Smokeless tobacco  . Not on file    History  Alcohol Use No    Family History  Problem Relation Age of Onset  . Hypertension Mother     Review of Systems: As noted history of present illness.  All other systems were reviewed and are negative.  Physical Exam: BP 140/84 mmHg  Pulse 59  Ht 5\' 10"  (1.778 m)  Wt 77.248 kg (170 lb 4.8 oz)  BMI 24.44 kg/m2 He is a pleasant, thin white male in no acute distress.The patient is alert and oriented x  3.    The HEENT exam is normal. The carotids are 2+ without bruits.  There is no thyromegaly.  There is no JVD.  The lungs are clear.   The heart exam reveals a regular rate with a normal S1. He has a good mechanical aortic valve click. There is no murmur or S3. The PMI is not displaced.   Abdominal exam reveals good bowel sounds.  There is no hepatosplenomegaly or tenderness.   Exam of the legs reveal no clubbing, cyanosis, or edema.The distal pulses are intact.  Cranial nerves II - XII are intact.  Motor and sensory functions are intact.  The gait is normal.  LABORATORY DATA: INR was 3.0 on 08/09/14  Ecg today shows NSR with normal Ecg. I have personally reviewed and interpreted this study.    Assessment / Plan:  1. Status post mechanical aortic valve replacement. Clinically stable. Continue chronic anticoagulation. Routine SBE prophylaxis. I will followup again in 6 months.  2. Hypertension, well controlled. Continue Hyzaar and metoprolol.   3. Hypercholesterolemia-on Crestor.

## 2014-09-06 ENCOUNTER — Ambulatory Visit (INDEPENDENT_AMBULATORY_CARE_PROVIDER_SITE_OTHER): Payer: BC Managed Care – PPO | Admitting: *Deleted

## 2014-09-06 DIAGNOSIS — Z7901 Long term (current) use of anticoagulants: Secondary | ICD-10-CM | POA: Diagnosis not present

## 2014-09-06 DIAGNOSIS — Z5181 Encounter for therapeutic drug level monitoring: Secondary | ICD-10-CM | POA: Diagnosis not present

## 2014-09-06 DIAGNOSIS — Z954 Presence of other heart-valve replacement: Secondary | ICD-10-CM

## 2014-09-06 DIAGNOSIS — I359 Nonrheumatic aortic valve disorder, unspecified: Secondary | ICD-10-CM

## 2014-09-06 DIAGNOSIS — Z952 Presence of prosthetic heart valve: Secondary | ICD-10-CM

## 2014-09-06 LAB — POCT INR: INR: 2.2

## 2014-09-28 ENCOUNTER — Other Ambulatory Visit: Payer: Self-pay | Admitting: *Deleted

## 2014-09-28 MED ORDER — METOPROLOL TARTRATE 25 MG PO TABS: 25.0000 mg | ORAL_TABLET | Freq: Every day | ORAL | Status: DC

## 2014-10-03 ENCOUNTER — Other Ambulatory Visit: Payer: Self-pay | Admitting: *Deleted

## 2014-10-04 ENCOUNTER — Other Ambulatory Visit: Payer: Self-pay | Admitting: *Deleted

## 2014-10-04 ENCOUNTER — Ambulatory Visit (INDEPENDENT_AMBULATORY_CARE_PROVIDER_SITE_OTHER): Payer: BC Managed Care – PPO

## 2014-10-04 DIAGNOSIS — I359 Nonrheumatic aortic valve disorder, unspecified: Secondary | ICD-10-CM | POA: Diagnosis not present

## 2014-10-04 DIAGNOSIS — Z5181 Encounter for therapeutic drug level monitoring: Secondary | ICD-10-CM | POA: Diagnosis not present

## 2014-10-04 DIAGNOSIS — Z954 Presence of other heart-valve replacement: Secondary | ICD-10-CM

## 2014-10-04 DIAGNOSIS — Z7901 Long term (current) use of anticoagulants: Secondary | ICD-10-CM

## 2014-10-04 DIAGNOSIS — Z952 Presence of prosthetic heart valve: Secondary | ICD-10-CM

## 2014-10-04 LAB — POCT INR: INR: 3.2

## 2014-10-04 MED ORDER — METOPROLOL TARTRATE 25 MG PO TABS: 25.0000 mg | ORAL_TABLET | Freq: Every day | ORAL | Status: DC

## 2014-10-19 ENCOUNTER — Other Ambulatory Visit: Payer: Self-pay | Admitting: Pharmacist Clinician (PhC)/ Clinical Pharmacy Specialist

## 2014-10-19 MED ORDER — WARFARIN SODIUM 5 MG PO TABS: ORAL_TABLET | ORAL | Status: DC

## 2014-11-08 ENCOUNTER — Ambulatory Visit (INDEPENDENT_AMBULATORY_CARE_PROVIDER_SITE_OTHER): Payer: BC Managed Care – PPO | Admitting: *Deleted

## 2014-11-08 DIAGNOSIS — I359 Nonrheumatic aortic valve disorder, unspecified: Secondary | ICD-10-CM | POA: Diagnosis not present

## 2014-11-08 DIAGNOSIS — Z5181 Encounter for therapeutic drug level monitoring: Secondary | ICD-10-CM | POA: Diagnosis not present

## 2014-11-08 DIAGNOSIS — Z7901 Long term (current) use of anticoagulants: Secondary | ICD-10-CM | POA: Diagnosis not present

## 2014-11-08 DIAGNOSIS — Z954 Presence of other heart-valve replacement: Secondary | ICD-10-CM

## 2014-11-08 DIAGNOSIS — Z952 Presence of prosthetic heart valve: Secondary | ICD-10-CM

## 2014-11-08 LAB — POCT INR: INR: 3.5

## 2014-11-24 ENCOUNTER — Telehealth: Payer: Self-pay | Admitting: Cardiology

## 2014-11-28 NOTE — Telephone Encounter (Signed)
Close encounter 

## 2014-12-05 ENCOUNTER — Other Ambulatory Visit: Payer: Self-pay

## 2014-12-05 MED ORDER — ROSUVASTATIN CALCIUM 10 MG PO TABS: ORAL_TABLET | ORAL | Status: DC

## 2014-12-22 ENCOUNTER — Ambulatory Visit (INDEPENDENT_AMBULATORY_CARE_PROVIDER_SITE_OTHER): Payer: BC Managed Care – PPO | Admitting: *Deleted

## 2014-12-22 DIAGNOSIS — Z954 Presence of other heart-valve replacement: Secondary | ICD-10-CM

## 2014-12-22 DIAGNOSIS — Z5181 Encounter for therapeutic drug level monitoring: Secondary | ICD-10-CM | POA: Diagnosis not present

## 2014-12-22 DIAGNOSIS — Z7901 Long term (current) use of anticoagulants: Secondary | ICD-10-CM

## 2014-12-22 DIAGNOSIS — I359 Nonrheumatic aortic valve disorder, unspecified: Secondary | ICD-10-CM | POA: Diagnosis not present

## 2014-12-22 DIAGNOSIS — Z952 Presence of prosthetic heart valve: Secondary | ICD-10-CM

## 2014-12-22 LAB — POCT INR: INR: 2.4

## 2014-12-29 ENCOUNTER — Other Ambulatory Visit: Payer: Self-pay | Admitting: Urology

## 2014-12-29 DIAGNOSIS — R972 Elevated prostate specific antigen [PSA]: Secondary | ICD-10-CM

## 2015-01-12 ENCOUNTER — Ambulatory Visit (INDEPENDENT_AMBULATORY_CARE_PROVIDER_SITE_OTHER): Payer: BC Managed Care – PPO | Admitting: Surgery

## 2015-01-12 DIAGNOSIS — Z954 Presence of other heart-valve replacement: Secondary | ICD-10-CM

## 2015-01-12 DIAGNOSIS — I359 Nonrheumatic aortic valve disorder, unspecified: Secondary | ICD-10-CM | POA: Diagnosis not present

## 2015-01-12 DIAGNOSIS — Z7901 Long term (current) use of anticoagulants: Secondary | ICD-10-CM | POA: Diagnosis not present

## 2015-01-12 DIAGNOSIS — Z5181 Encounter for therapeutic drug level monitoring: Secondary | ICD-10-CM

## 2015-01-12 DIAGNOSIS — Z952 Presence of prosthetic heart valve: Secondary | ICD-10-CM

## 2015-01-12 LAB — POCT INR: INR: 4

## 2015-01-19 ENCOUNTER — Ambulatory Visit (HOSPITAL_COMMUNITY)
Admission: RE | Admit: 2015-01-19 | Discharge: 2015-01-19 | Disposition: A | Discharge status: Home / Self Care | Payer: BC Managed Care – PPO | Source: Ambulatory Visit | Attending: Urology | Admitting: Urology

## 2015-01-19 DIAGNOSIS — N400 Enlarged prostate without lower urinary tract symptoms: Secondary | ICD-10-CM | POA: Diagnosis not present

## 2015-01-19 DIAGNOSIS — R972 Elevated prostate specific antigen [PSA]: Secondary | ICD-10-CM | POA: Diagnosis not present

## 2015-01-19 LAB — POCT I-STAT CREATININE: Creatinine, Ser: 1 mg/dL (ref 0.61–1.24)

## 2015-01-19 MED ORDER — GADOBENATE DIMEGLUMINE 529 MG/ML IV SOLN
15.0000 mL | Freq: Once | INTRAVENOUS | Status: AC | PRN
  Administered 2015-01-19: 15 mL via INTRAVENOUS

## 2015-01-26 ENCOUNTER — Ambulatory Visit (INDEPENDENT_AMBULATORY_CARE_PROVIDER_SITE_OTHER): Payer: BC Managed Care – PPO | Admitting: Surgery

## 2015-01-26 DIAGNOSIS — Z7901 Long term (current) use of anticoagulants: Secondary | ICD-10-CM

## 2015-01-26 DIAGNOSIS — Z5181 Encounter for therapeutic drug level monitoring: Secondary | ICD-10-CM | POA: Diagnosis not present

## 2015-01-26 DIAGNOSIS — I359 Nonrheumatic aortic valve disorder, unspecified: Secondary | ICD-10-CM

## 2015-01-26 DIAGNOSIS — Z954 Presence of other heart-valve replacement: Secondary | ICD-10-CM | POA: Diagnosis not present

## 2015-01-26 DIAGNOSIS — Z952 Presence of prosthetic heart valve: Secondary | ICD-10-CM

## 2015-01-26 LAB — POCT INR: INR: 3.7

## 2015-02-01 ENCOUNTER — Encounter: Payer: Self-pay | Admitting: *Deleted

## 2015-02-01 DIAGNOSIS — Z952 Presence of prosthetic heart valve: Secondary | ICD-10-CM | POA: Insufficient documentation

## 2015-02-01 DIAGNOSIS — E785 Hyperlipidemia, unspecified: Secondary | ICD-10-CM | POA: Insufficient documentation

## 2015-02-01 DIAGNOSIS — I10 Essential (primary) hypertension: Secondary | ICD-10-CM | POA: Insufficient documentation

## 2015-02-09 ENCOUNTER — Ambulatory Visit (INDEPENDENT_AMBULATORY_CARE_PROVIDER_SITE_OTHER): Payer: BC Managed Care – PPO | Admitting: *Deleted

## 2015-02-09 DIAGNOSIS — Z5181 Encounter for therapeutic drug level monitoring: Secondary | ICD-10-CM | POA: Diagnosis not present

## 2015-02-09 DIAGNOSIS — Z954 Presence of other heart-valve replacement: Secondary | ICD-10-CM

## 2015-02-09 DIAGNOSIS — I359 Nonrheumatic aortic valve disorder, unspecified: Secondary | ICD-10-CM

## 2015-02-09 DIAGNOSIS — Z952 Presence of prosthetic heart valve: Secondary | ICD-10-CM

## 2015-02-09 DIAGNOSIS — Z7901 Long term (current) use of anticoagulants: Secondary | ICD-10-CM

## 2015-02-09 LAB — POCT INR: INR: 2.7

## 2015-02-15 ENCOUNTER — Ambulatory Visit (INDEPENDENT_AMBULATORY_CARE_PROVIDER_SITE_OTHER): Payer: BC Managed Care – PPO | Admitting: Cardiology

## 2015-02-15 ENCOUNTER — Encounter: Payer: Self-pay | Admitting: Cardiology

## 2015-02-15 VITALS — BP 141/71 | HR 53 | Ht 70.0 in | Wt 175.4 lb

## 2015-02-15 DIAGNOSIS — Z954 Presence of other heart-valve replacement: Secondary | ICD-10-CM | POA: Diagnosis not present

## 2015-02-15 DIAGNOSIS — Z952 Presence of prosthetic heart valve: Secondary | ICD-10-CM

## 2015-02-15 DIAGNOSIS — Z5181 Encounter for therapeutic drug level monitoring: Secondary | ICD-10-CM | POA: Diagnosis not present

## 2015-02-15 DIAGNOSIS — I10 Essential (primary) hypertension: Secondary | ICD-10-CM | POA: Diagnosis not present

## 2015-02-15 DIAGNOSIS — Z7901 Long term (current) use of anticoagulants: Secondary | ICD-10-CM

## 2015-02-15 NOTE — Progress Notes (Signed)
   [name] Date of Birth: April 13, 1950   History of Present Illness: Nathan Frye is seen today for followup of his aortic valve disease. 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 Last Echo in 2012 showed normal valve function. He denies any significant symptoms of chest pain, shortness of breath, or palpitations. 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 He does have a history of elevated PSA. Prior biopsy negative. Had recent MRI showing a 5 mm nodule. This will be discussed with urology on follow up.   Current Outpatient Prescriptions on File Prior to Visit  Medication Sig Dispense Refill  . Ergocalciferol 2000 UNITS TABS Take 2,000 Units by mouth daily.    Marland Kitchen losartan-hydrochlorothiazide (HYZAAR) 50-12.5 MG per tablet Take 1 tablet by mouth daily.     . metoprolol tartrate (LOPRESSOR) 25 MG tablet Take 1 tablet (25 mg total) by mouth daily. 30 tablet 2  . rosuvastatin (CRESTOR) 10 MG tablet TAKE 1 TABLET (10 MG TOTAL) BY MOUTH DAILY. 30 tablet 11  . warfarin (COUMADIN) 5 MG tablet Take 1 to 1.5 tablets by mouth daily as directed by coumadin clinic 35 tablet 5   No current facility-administered medications on file prior to visit.    No Known Allergies  Past Medical History  Diagnosis Date  . Severe aortic stenosis   . Chronic anticoagulation   . HTN (hypertension)   . Hypercholesteremia     Past Surgical History  Procedure Laterality Date  . Aortic valve replacement  2007    #21 MM [facility] VALVE  . Vasectomy      History  Smoking status  . Never Smoker   Smokeless tobacco  . Not on file    History  Alcohol Use No    Family History  Problem Relation Age of Onset  . Hypertension Mother     Review of Systems: As noted history of present illness.  All other systems were reviewed and are negative.  Physical Exam: BP 141/71 mmHg  Pulse 53  Ht 5\' 10"  (1.778 m)  Wt 79.561  kg (175 lb 6.4 oz)  BMI 25.17 kg/m2 He is a pleasant, thin white male in no acute distress.The patient is alert and oriented x 3.    The HEENT exam is normal. The carotids are 2+ without bruits.  There is no thyromegaly.  There is no JVD.  The lungs are clear.   The heart exam reveals a regular rate with a normal S1. He has a good mechanical aortic valve click. There is no murmur or S3. The PMI is not displaced.   Abdominal exam reveals good bowel sounds.  There is no hepatosplenomegaly or tenderness.   Exam of the legs reveal no clubbing, cyanosis, or edema.The distal pulses are intact.  Cranial nerves II - XII are intact.  Motor and sensory functions are intact.  The gait is normal.  LABORATORY DATA: INR was 2.7 on 02/09/15   Assessment / Plan:  1. Status post mechanical aortic valve replacement. Clinically stable. Continue chronic anticoagulation. Routine SBE prophylaxis. I will followup again in 6 months. Consider follow up Echo next September.  2. Hypertension, well controlled. Continue Hyzaar and metoprolol. Labs followed by primary care.   3. Hypercholesterolemia-on Crestor.   4. Elevated PSA followed by urology.

## 2015-02-15 NOTE — Patient Instructions (Signed)
Continue your current therapy  I will see you in 6 months.   

## 2015-02-23 ENCOUNTER — Ambulatory Visit (INDEPENDENT_AMBULATORY_CARE_PROVIDER_SITE_OTHER): Payer: BC Managed Care – PPO | Admitting: *Deleted

## 2015-02-23 DIAGNOSIS — Z7901 Long term (current) use of anticoagulants: Secondary | ICD-10-CM

## 2015-02-23 DIAGNOSIS — Z5181 Encounter for therapeutic drug level monitoring: Secondary | ICD-10-CM

## 2015-02-23 DIAGNOSIS — I359 Nonrheumatic aortic valve disorder, unspecified: Secondary | ICD-10-CM | POA: Diagnosis not present

## 2015-02-23 DIAGNOSIS — Z952 Presence of prosthetic heart valve: Secondary | ICD-10-CM

## 2015-02-23 DIAGNOSIS — Z954 Presence of other heart-valve replacement: Secondary | ICD-10-CM | POA: Diagnosis not present

## 2015-02-23 LAB — POCT INR: INR: 2.8

## 2015-03-23 ENCOUNTER — Ambulatory Visit (INDEPENDENT_AMBULATORY_CARE_PROVIDER_SITE_OTHER): Payer: BC Managed Care – PPO | Admitting: *Deleted

## 2015-03-23 DIAGNOSIS — Z954 Presence of other heart-valve replacement: Secondary | ICD-10-CM

## 2015-03-23 DIAGNOSIS — I359 Nonrheumatic aortic valve disorder, unspecified: Secondary | ICD-10-CM | POA: Diagnosis not present

## 2015-03-23 DIAGNOSIS — Z5181 Encounter for therapeutic drug level monitoring: Secondary | ICD-10-CM | POA: Diagnosis not present

## 2015-03-23 DIAGNOSIS — Z952 Presence of prosthetic heart valve: Secondary | ICD-10-CM

## 2015-03-23 DIAGNOSIS — Z7901 Long term (current) use of anticoagulants: Secondary | ICD-10-CM

## 2015-03-23 LAB — POCT INR: INR: 2.7

## 2015-04-20 ENCOUNTER — Ambulatory Visit (INDEPENDENT_AMBULATORY_CARE_PROVIDER_SITE_OTHER): Payer: BC Managed Care – PPO

## 2015-04-20 DIAGNOSIS — Z954 Presence of other heart-valve replacement: Secondary | ICD-10-CM | POA: Diagnosis not present

## 2015-04-20 DIAGNOSIS — Z952 Presence of prosthetic heart valve: Secondary | ICD-10-CM

## 2015-04-20 DIAGNOSIS — I359 Nonrheumatic aortic valve disorder, unspecified: Secondary | ICD-10-CM

## 2015-04-20 DIAGNOSIS — Z5181 Encounter for therapeutic drug level monitoring: Secondary | ICD-10-CM | POA: Diagnosis not present

## 2015-04-20 DIAGNOSIS — Z7901 Long term (current) use of anticoagulants: Secondary | ICD-10-CM

## 2015-04-20 LAB — POCT INR: INR: 3.2

## 2015-04-26 ENCOUNTER — Other Ambulatory Visit: Payer: Self-pay | Admitting: Cardiology

## 2015-05-18 ENCOUNTER — Ambulatory Visit (INDEPENDENT_AMBULATORY_CARE_PROVIDER_SITE_OTHER): Payer: BC Managed Care – PPO | Admitting: *Deleted

## 2015-05-18 DIAGNOSIS — Z7901 Long term (current) use of anticoagulants: Secondary | ICD-10-CM | POA: Diagnosis not present

## 2015-05-18 DIAGNOSIS — I359 Nonrheumatic aortic valve disorder, unspecified: Secondary | ICD-10-CM

## 2015-05-18 DIAGNOSIS — Z954 Presence of other heart-valve replacement: Secondary | ICD-10-CM

## 2015-05-18 DIAGNOSIS — Z5181 Encounter for therapeutic drug level monitoring: Secondary | ICD-10-CM | POA: Diagnosis not present

## 2015-05-18 DIAGNOSIS — Z952 Presence of prosthetic heart valve: Secondary | ICD-10-CM

## 2015-05-18 LAB — POCT INR: INR: 3.1

## 2015-06-15 ENCOUNTER — Ambulatory Visit (INDEPENDENT_AMBULATORY_CARE_PROVIDER_SITE_OTHER): Payer: BC Managed Care – PPO | Admitting: *Deleted

## 2015-06-15 DIAGNOSIS — Z952 Presence of prosthetic heart valve: Secondary | ICD-10-CM

## 2015-06-15 DIAGNOSIS — Z954 Presence of other heart-valve replacement: Secondary | ICD-10-CM | POA: Diagnosis not present

## 2015-06-15 DIAGNOSIS — Z7901 Long term (current) use of anticoagulants: Secondary | ICD-10-CM

## 2015-06-15 DIAGNOSIS — Z5181 Encounter for therapeutic drug level monitoring: Secondary | ICD-10-CM

## 2015-06-15 DIAGNOSIS — I359 Nonrheumatic aortic valve disorder, unspecified: Secondary | ICD-10-CM

## 2015-06-15 LAB — POCT INR: INR: 2.7

## 2015-07-13 ENCOUNTER — Ambulatory Visit (INDEPENDENT_AMBULATORY_CARE_PROVIDER_SITE_OTHER): Payer: BC Managed Care – PPO | Admitting: *Deleted

## 2015-07-13 DIAGNOSIS — Z954 Presence of other heart-valve replacement: Secondary | ICD-10-CM

## 2015-07-13 DIAGNOSIS — Z5181 Encounter for therapeutic drug level monitoring: Secondary | ICD-10-CM | POA: Diagnosis not present

## 2015-07-13 DIAGNOSIS — I359 Nonrheumatic aortic valve disorder, unspecified: Secondary | ICD-10-CM | POA: Diagnosis not present

## 2015-07-13 DIAGNOSIS — Z7901 Long term (current) use of anticoagulants: Secondary | ICD-10-CM | POA: Diagnosis not present

## 2015-07-13 DIAGNOSIS — Z952 Presence of prosthetic heart valve: Secondary | ICD-10-CM

## 2015-07-13 LAB — POCT INR: INR: 2.1

## 2015-07-21 ENCOUNTER — Other Ambulatory Visit: Payer: Self-pay | Admitting: Cardiology

## 2015-07-21 NOTE — Telephone Encounter (Signed)
Rx request sent to pharmacy.  

## 2015-07-27 ENCOUNTER — Ambulatory Visit (INDEPENDENT_AMBULATORY_CARE_PROVIDER_SITE_OTHER): Payer: BC Managed Care – PPO | Admitting: *Deleted

## 2015-07-27 DIAGNOSIS — Z5181 Encounter for therapeutic drug level monitoring: Secondary | ICD-10-CM | POA: Diagnosis not present

## 2015-07-27 DIAGNOSIS — Z954 Presence of other heart-valve replacement: Secondary | ICD-10-CM | POA: Diagnosis not present

## 2015-07-27 DIAGNOSIS — Z952 Presence of prosthetic heart valve: Secondary | ICD-10-CM

## 2015-07-27 DIAGNOSIS — I359 Nonrheumatic aortic valve disorder, unspecified: Secondary | ICD-10-CM | POA: Diagnosis not present

## 2015-07-27 DIAGNOSIS — Z7901 Long term (current) use of anticoagulants: Secondary | ICD-10-CM

## 2015-07-27 LAB — POCT INR: INR: 3.7

## 2015-08-10 ENCOUNTER — Ambulatory Visit (INDEPENDENT_AMBULATORY_CARE_PROVIDER_SITE_OTHER): Payer: BC Managed Care – PPO | Admitting: *Deleted

## 2015-08-10 DIAGNOSIS — Z954 Presence of other heart-valve replacement: Secondary | ICD-10-CM

## 2015-08-10 DIAGNOSIS — Z7901 Long term (current) use of anticoagulants: Secondary | ICD-10-CM

## 2015-08-10 DIAGNOSIS — Z5181 Encounter for therapeutic drug level monitoring: Secondary | ICD-10-CM

## 2015-08-10 DIAGNOSIS — I359 Nonrheumatic aortic valve disorder, unspecified: Secondary | ICD-10-CM | POA: Diagnosis not present

## 2015-08-10 DIAGNOSIS — Z952 Presence of prosthetic heart valve: Secondary | ICD-10-CM

## 2015-08-10 LAB — POCT INR: INR: 2.7

## 2015-08-25 ENCOUNTER — Encounter: Payer: Self-pay | Admitting: Cardiology

## 2015-08-25 ENCOUNTER — Ambulatory Visit (INDEPENDENT_AMBULATORY_CARE_PROVIDER_SITE_OTHER): Payer: BC Managed Care – PPO | Admitting: Pharmacist Clinician (PhC)/ Clinical Pharmacy Specialist

## 2015-08-25 ENCOUNTER — Ambulatory Visit (INDEPENDENT_AMBULATORY_CARE_PROVIDER_SITE_OTHER): Payer: BC Managed Care – PPO | Admitting: Cardiology

## 2015-08-25 VITALS — BP 118/70 | HR 62 | Ht 70.0 in | Wt 168.8 lb

## 2015-08-25 DIAGNOSIS — Z954 Presence of other heart-valve replacement: Secondary | ICD-10-CM

## 2015-08-25 DIAGNOSIS — I10 Essential (primary) hypertension: Secondary | ICD-10-CM | POA: Diagnosis not present

## 2015-08-25 DIAGNOSIS — Z7901 Long term (current) use of anticoagulants: Secondary | ICD-10-CM

## 2015-08-25 DIAGNOSIS — I350 Nonrheumatic aortic (valve) stenosis: Secondary | ICD-10-CM | POA: Diagnosis not present

## 2015-08-25 DIAGNOSIS — Z952 Presence of prosthetic heart valve: Secondary | ICD-10-CM

## 2015-08-25 DIAGNOSIS — Z5181 Encounter for therapeutic drug level monitoring: Secondary | ICD-10-CM | POA: Diagnosis not present

## 2015-08-25 DIAGNOSIS — I359 Nonrheumatic aortic valve disorder, unspecified: Secondary | ICD-10-CM | POA: Diagnosis not present

## 2015-08-25 DIAGNOSIS — E7800 Pure hypercholesterolemia, unspecified: Secondary | ICD-10-CM | POA: Diagnosis not present

## 2015-08-25 LAB — POCT INR: INR: 3.7

## 2015-08-25 NOTE — Progress Notes (Signed)
   [name] Date of Birth: 1950/06/01   History of Present Illness: Nathan Frye is seen today for followup of his aortic valve disease. 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 Last Echo in 2012 showed normal valve function. He denies any significant symptoms of chest pain, shortness of breath, or palpitations. 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    Current Outpatient Prescriptions on File Prior to Visit  Medication Sig Dispense Refill  . Ergocalciferol 2000 UNITS TABS Take 2,000 Units by mouth daily.    Marland Kitchen losartan-hydrochlorothiazide (HYZAAR) 50-12.5 MG per tablet Take 1 tablet by mouth daily.     . metoprolol tartrate (LOPRESSOR) 25 MG tablet TAKE 1 TABLET BY MOUTH EVERY DAY 30 tablet 2  . rosuvastatin (CRESTOR) 10 MG tablet TAKE 1 TABLET (10 MG TOTAL) BY MOUTH DAILY. 30 tablet 11  . warfarin (COUMADIN) 5 MG tablet TAKE 1 TO 1.5 TABLETS BY MOUTH DAILY AS DIRECTED BY COUMADIN CLINIC 35 tablet 5   No current facility-administered medications on file prior to visit.    No Known Allergies  Past Medical History  Diagnosis Date  . Severe aortic stenosis   . Chronic anticoagulation   . HTN (hypertension)   . Hypercholesteremia     Past Surgical History  Procedure Laterality Date  . Aortic valve replacement  2007    #21 MM [facility] VALVE  . Vasectomy      History  Smoking status  . Never Smoker   Smokeless tobacco  . Not on file    History  Alcohol Use No    Family History  Problem Relation Age of Onset  . Hypertension Mother     Review of Systems: As noted history of present illness.  All other systems were reviewed and are negative.  Physical Exam: There were no vitals taken for this visit. He is a pleasant, thin white male in no acute distress.The patient is alert and oriented x 3.    The HEENT exam is normal. The carotids are 2+ without bruits.  There is no  thyromegaly.  There is no JVD.  The lungs are clear.   The heart exam reveals a regular rate with a normal S1. He has a good mechanical aortic valve click. There is no murmur or S3. The PMI is not displaced.   Abdominal exam reveals good bowel sounds.  There is no hepatosplenomegaly or tenderness.   Exam of the legs reveal no clubbing, cyanosis, or edema.The distal pulses are intact.  Cranial nerves II - XII are intact.  Motor and sensory functions are intact.  The gait is normal.  LABORATORY DATA: INR is  3.7 Today  LABS reviewed from 04/27/15. Hgb 13.1. CMET normal, Cholesterol 167, Triglycerides 80, HDL 80, LDL 71.   Ecg today shows NSR with PAC couplet. LVH. I have personally reviewed and interpreted this study.    Assessment / Plan:  1. Status post mechanical aortic valve replacement. Clinically stable. Continue chronic anticoagulation. Routine SBE prophylaxis. I will followup again in 6 months. Will schedule for Echocardiogram since it has been 5 years since his last study.   2. Hypertension, well controlled. Continue Hyzaar and metoprolol. Labs look good.   3. Hypercholesterolemia-on Crestor. Excellent control.

## 2015-08-25 NOTE — Patient Instructions (Signed)
Continue your current therapy  I will schedule you for an Echocardiogram  I will see you in 6 months.

## 2015-09-07 ENCOUNTER — Encounter (INDEPENDENT_AMBULATORY_CARE_PROVIDER_SITE_OTHER): Payer: Self-pay

## 2015-09-07 ENCOUNTER — Ambulatory Visit (INDEPENDENT_AMBULATORY_CARE_PROVIDER_SITE_OTHER): Payer: BC Managed Care – PPO | Admitting: *Deleted

## 2015-09-07 DIAGNOSIS — I359 Nonrheumatic aortic valve disorder, unspecified: Secondary | ICD-10-CM | POA: Diagnosis not present

## 2015-09-07 DIAGNOSIS — Z7901 Long term (current) use of anticoagulants: Secondary | ICD-10-CM | POA: Diagnosis not present

## 2015-09-07 DIAGNOSIS — Z952 Presence of prosthetic heart valve: Secondary | ICD-10-CM

## 2015-09-07 DIAGNOSIS — Z954 Presence of other heart-valve replacement: Secondary | ICD-10-CM | POA: Diagnosis not present

## 2015-09-07 DIAGNOSIS — Z5181 Encounter for therapeutic drug level monitoring: Secondary | ICD-10-CM

## 2015-09-07 LAB — POCT INR: INR: 2.8

## 2015-09-11 ENCOUNTER — Other Ambulatory Visit (HOSPITAL_COMMUNITY): Payer: Self-pay

## 2015-09-11 ENCOUNTER — Ambulatory Visit (HOSPITAL_COMMUNITY): Payer: BC Managed Care – PPO | Attending: Cardiovascular Disease

## 2015-09-11 DIAGNOSIS — I350 Nonrheumatic aortic (valve) stenosis: Secondary | ICD-10-CM

## 2015-09-11 DIAGNOSIS — E785 Hyperlipidemia, unspecified: Secondary | ICD-10-CM | POA: Insufficient documentation

## 2015-09-11 DIAGNOSIS — Z954 Presence of other heart-valve replacement: Secondary | ICD-10-CM

## 2015-09-11 DIAGNOSIS — I351 Nonrheumatic aortic (valve) insufficiency: Secondary | ICD-10-CM | POA: Diagnosis not present

## 2015-09-11 DIAGNOSIS — I10 Essential (primary) hypertension: Secondary | ICD-10-CM

## 2015-09-11 DIAGNOSIS — I340 Nonrheumatic mitral (valve) insufficiency: Secondary | ICD-10-CM | POA: Diagnosis not present

## 2015-09-11 DIAGNOSIS — E7800 Pure hypercholesterolemia, unspecified: Secondary | ICD-10-CM | POA: Diagnosis not present

## 2015-09-11 DIAGNOSIS — Z952 Presence of prosthetic heart valve: Secondary | ICD-10-CM

## 2015-09-29 ENCOUNTER — Ambulatory Visit (INDEPENDENT_AMBULATORY_CARE_PROVIDER_SITE_OTHER): Payer: BC Managed Care – PPO | Admitting: Pharmacist

## 2015-09-29 DIAGNOSIS — Z954 Presence of other heart-valve replacement: Secondary | ICD-10-CM | POA: Diagnosis not present

## 2015-09-29 DIAGNOSIS — I359 Nonrheumatic aortic valve disorder, unspecified: Secondary | ICD-10-CM | POA: Diagnosis not present

## 2015-09-29 DIAGNOSIS — Z7901 Long term (current) use of anticoagulants: Secondary | ICD-10-CM

## 2015-09-29 DIAGNOSIS — Z952 Presence of prosthetic heart valve: Secondary | ICD-10-CM

## 2015-09-29 DIAGNOSIS — Z5181 Encounter for therapeutic drug level monitoring: Secondary | ICD-10-CM

## 2015-09-29 LAB — POCT INR: INR: 3.7

## 2015-10-20 ENCOUNTER — Ambulatory Visit (INDEPENDENT_AMBULATORY_CARE_PROVIDER_SITE_OTHER): Payer: BC Managed Care – PPO | Admitting: *Deleted

## 2015-10-20 ENCOUNTER — Other Ambulatory Visit: Payer: Self-pay | Admitting: Cardiology

## 2015-10-20 DIAGNOSIS — I359 Nonrheumatic aortic valve disorder, unspecified: Secondary | ICD-10-CM | POA: Diagnosis not present

## 2015-10-20 DIAGNOSIS — Z954 Presence of other heart-valve replacement: Secondary | ICD-10-CM | POA: Diagnosis not present

## 2015-10-20 DIAGNOSIS — Z5181 Encounter for therapeutic drug level monitoring: Secondary | ICD-10-CM

## 2015-10-20 DIAGNOSIS — Z952 Presence of prosthetic heart valve: Secondary | ICD-10-CM

## 2015-10-20 DIAGNOSIS — Z7901 Long term (current) use of anticoagulants: Secondary | ICD-10-CM | POA: Diagnosis not present

## 2015-10-20 LAB — POCT INR: INR: 2.8

## 2015-11-17 ENCOUNTER — Other Ambulatory Visit: Payer: Self-pay | Admitting: Cardiology

## 2015-11-17 ENCOUNTER — Ambulatory Visit (INDEPENDENT_AMBULATORY_CARE_PROVIDER_SITE_OTHER): Payer: BC Managed Care – PPO

## 2015-11-17 DIAGNOSIS — Z5181 Encounter for therapeutic drug level monitoring: Secondary | ICD-10-CM

## 2015-11-17 DIAGNOSIS — Z952 Presence of prosthetic heart valve: Secondary | ICD-10-CM | POA: Diagnosis not present

## 2015-11-17 DIAGNOSIS — I359 Nonrheumatic aortic valve disorder, unspecified: Secondary | ICD-10-CM | POA: Diagnosis not present

## 2015-11-17 DIAGNOSIS — Z7901 Long term (current) use of anticoagulants: Secondary | ICD-10-CM | POA: Diagnosis not present

## 2015-11-17 LAB — POCT INR: INR: 2.8

## 2015-12-21 ENCOUNTER — Ambulatory Visit (INDEPENDENT_AMBULATORY_CARE_PROVIDER_SITE_OTHER): Payer: BC Managed Care – PPO | Admitting: *Deleted

## 2015-12-21 DIAGNOSIS — Z5181 Encounter for therapeutic drug level monitoring: Secondary | ICD-10-CM | POA: Diagnosis not present

## 2015-12-21 LAB — POCT INR: INR: 3.2

## 2016-01-15 ENCOUNTER — Ambulatory Visit (INDEPENDENT_AMBULATORY_CARE_PROVIDER_SITE_OTHER): Payer: BC Managed Care – PPO | Admitting: *Deleted

## 2016-01-15 DIAGNOSIS — Z5181 Encounter for therapeutic drug level monitoring: Secondary | ICD-10-CM | POA: Diagnosis not present

## 2016-01-15 LAB — POCT INR: INR: 2.3

## 2016-02-06 ENCOUNTER — Ambulatory Visit (INDEPENDENT_AMBULATORY_CARE_PROVIDER_SITE_OTHER): Payer: BC Managed Care – PPO | Admitting: Pharmacist

## 2016-02-06 DIAGNOSIS — Z5181 Encounter for therapeutic drug level monitoring: Secondary | ICD-10-CM | POA: Diagnosis not present

## 2016-02-06 LAB — POCT INR: INR: 2.6

## 2016-02-16 ENCOUNTER — Telehealth: Payer: Self-pay

## 2016-02-16 NOTE — Telephone Encounter (Signed)
Opened in error

## 2016-02-18 ENCOUNTER — Other Ambulatory Visit: Payer: Self-pay | Admitting: Cardiology

## 2016-02-20 NOTE — Progress Notes (Signed)
Nathan Frye Date of Birth: 03-11-1950   History of Present Illness: Nathan Frye is seen today for followup of his aortic valve disease. 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 Last Echo in July 2017 showed normal valve function. He denies any significant symptoms of chest pain, shortness of breath, or palpitations. 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    Current Outpatient Prescriptions on File Prior to Visit  Medication Sig Dispense Refill  . Ergocalciferol 2000 UNITS TABS Take 2,000 Units by mouth daily.    Marland Kitchen losartan-hydrochlorothiazide (HYZAAR) 50-12.5 MG per tablet Take 1 tablet by mouth daily.     . metoprolol tartrate (LOPRESSOR) 25 MG tablet TAKE 1 TABLET BY MOUTH EVERY DAY 30 tablet 6  . rosuvastatin (CRESTOR) 10 MG tablet TAKE 1 TABLET (10 MG TOTAL) BY MOUTH DAILY. 30 tablet 11  . warfarin (COUMADIN) 5 MG tablet TAKE 1 TO 1.5 TABLETS BY MOUTH DAILY AS DIRECTED BY COUMADIN CLINIC 35 tablet 2   No current facility-administered medications on file prior to visit.     No Known Allergies  Past Medical History:  Diagnosis Date  . Chronic anticoagulation   . HTN (hypertension)   . Hypercholesteremia   . Severe aortic stenosis     Past Surgical History:  Procedure Laterality Date  . AORTIC VALVE REPLACEMENT  2007   #21 MM [facility] VALVE  . VASECTOMY      History  Smoking Status  . Never Smoker  Smokeless Tobacco  . Not on file    History  Alcohol Use No    Family History  Problem Relation Age of Onset  . Hypertension Mother     Review of Systems: As noted history of present illness.  All other systems were reviewed and are negative.  Physical Exam: BP 130/74   Pulse 62   Ht 5\' 10"  (1.778 m)   Wt 170 lb (77.1 kg)   BMI 24.39 kg/m  He is a pleasant, thin white male in no acute distress.The patient is alert and oriented x  3.    The HEENT exam is normal. The carotids are 2+ without bruits.  There is no thyromegaly.  There is no JVD.  The lungs are clear.   The heart exam reveals a regular rate with a normal S1. He has a good mechanical aortic valve click. There is no murmur or S3. The PMI is not displaced.   Abdominal exam reveals good bowel sounds.  There is no hepatosplenomegaly or tenderness.   Exam of the legs reveal no clubbing, cyanosis, or edema.The distal pulses are intact.  Cranial nerves II - XII are intact.  Motor and sensory functions are intact.  The gait is normal.  LABORATORY DATA: INR is 2.6 02/06/16  LABS reviewed from 04/27/15. Hgb 13.1. CMET normal, Cholesterol 167, Triglycerides 80, HDL 80, LDL 71. TSH normal.  Echo: 09/11/15: Study Conclusions  - Left ventricle: The cavity size was normal. Wall thickness was   normal. Systolic function was normal. The estimated ejection   fraction was in the range of 55% to 60%. Wall motion was normal;   there were no regional wall motion abnormalities. Left   ventricular diastolic function parameters were normal. - Aortic valve: A mechanical prosthesis was present and functioning   normally. There was trivial regurgitation. Valve area (VTI): 1.58   cm^2. Valve area (Vmax): 1.62 cm^2.  Valve area (Vmean): 1.72   cm^2. - Mitral valve: There was mild regurgitation.  Assessment / Plan:  1. Status post mechanical aortic valve replacement. Echo looked great in July. Clinically stable. Continue chronic anticoagulation. Routine SBE prophylaxis. I will followup again in 6 months.   2. Hypertension, well controlled. Continue Hyzaar and metoprolol. Labs followed by primary care.  3. Hypercholesterolemia-on Crestor. Excellent control.

## 2016-02-22 ENCOUNTER — Encounter: Payer: Self-pay | Admitting: Cardiology

## 2016-02-22 ENCOUNTER — Ambulatory Visit (INDEPENDENT_AMBULATORY_CARE_PROVIDER_SITE_OTHER): Payer: BC Managed Care – PPO | Admitting: Cardiology

## 2016-02-22 VITALS — BP 130/74 | HR 62 | Ht 70.0 in | Wt 170.0 lb

## 2016-02-22 DIAGNOSIS — Z952 Presence of prosthetic heart valve: Secondary | ICD-10-CM

## 2016-02-22 DIAGNOSIS — Z7901 Long term (current) use of anticoagulants: Secondary | ICD-10-CM

## 2016-02-22 DIAGNOSIS — I10 Essential (primary) hypertension: Secondary | ICD-10-CM | POA: Diagnosis not present

## 2016-02-22 DIAGNOSIS — I350 Nonrheumatic aortic (valve) stenosis: Secondary | ICD-10-CM

## 2016-02-22 DIAGNOSIS — E7800 Pure hypercholesterolemia, unspecified: Secondary | ICD-10-CM

## 2016-02-22 NOTE — Patient Instructions (Signed)
Continue your current therapy  I will see you in 6 months.   

## 2016-03-06 ENCOUNTER — Ambulatory Visit (INDEPENDENT_AMBULATORY_CARE_PROVIDER_SITE_OTHER): Payer: BC Managed Care – PPO | Admitting: *Deleted

## 2016-03-06 DIAGNOSIS — Z5181 Encounter for therapeutic drug level monitoring: Secondary | ICD-10-CM

## 2016-03-06 LAB — POCT INR: INR: 2.9

## 2016-04-03 ENCOUNTER — Ambulatory Visit (INDEPENDENT_AMBULATORY_CARE_PROVIDER_SITE_OTHER): Payer: BC Managed Care – PPO | Admitting: *Deleted

## 2016-04-03 DIAGNOSIS — Z5181 Encounter for therapeutic drug level monitoring: Secondary | ICD-10-CM | POA: Diagnosis not present

## 2016-04-03 LAB — POCT INR: INR: 3.4

## 2016-04-26 ENCOUNTER — Other Ambulatory Visit: Payer: Self-pay | Admitting: Cardiology

## 2016-05-01 ENCOUNTER — Ambulatory Visit (INDEPENDENT_AMBULATORY_CARE_PROVIDER_SITE_OTHER): Payer: BC Managed Care – PPO | Admitting: *Deleted

## 2016-05-01 DIAGNOSIS — Z5181 Encounter for therapeutic drug level monitoring: Secondary | ICD-10-CM | POA: Diagnosis not present

## 2016-05-01 LAB — POCT INR: INR: 2.2

## 2016-05-06 ENCOUNTER — Other Ambulatory Visit: Payer: Self-pay | Admitting: Cardiology

## 2016-05-17 ENCOUNTER — Ambulatory Visit (INDEPENDENT_AMBULATORY_CARE_PROVIDER_SITE_OTHER): Payer: BC Managed Care – PPO

## 2016-05-17 DIAGNOSIS — Z5181 Encounter for therapeutic drug level monitoring: Secondary | ICD-10-CM | POA: Diagnosis not present

## 2016-05-17 LAB — POCT INR: INR: 2.6

## 2016-06-14 ENCOUNTER — Ambulatory Visit (INDEPENDENT_AMBULATORY_CARE_PROVIDER_SITE_OTHER): Payer: BC Managed Care – PPO | Admitting: *Deleted

## 2016-06-14 DIAGNOSIS — Z5181 Encounter for therapeutic drug level monitoring: Secondary | ICD-10-CM

## 2016-06-14 LAB — POCT INR: INR: 3

## 2016-07-12 ENCOUNTER — Ambulatory Visit (INDEPENDENT_AMBULATORY_CARE_PROVIDER_SITE_OTHER): Payer: BC Managed Care – PPO | Admitting: *Deleted

## 2016-07-12 DIAGNOSIS — Z5181 Encounter for therapeutic drug level monitoring: Secondary | ICD-10-CM | POA: Diagnosis not present

## 2016-07-12 LAB — POCT INR: INR: 3.8

## 2016-07-26 ENCOUNTER — Ambulatory Visit (INDEPENDENT_AMBULATORY_CARE_PROVIDER_SITE_OTHER): Payer: BC Managed Care – PPO | Admitting: Pharmacist

## 2016-07-26 DIAGNOSIS — Z5181 Encounter for therapeutic drug level monitoring: Secondary | ICD-10-CM

## 2016-07-26 LAB — POCT INR: INR: 3.3

## 2016-08-02 ENCOUNTER — Encounter: Payer: Self-pay | Admitting: Cardiology

## 2016-08-16 ENCOUNTER — Ambulatory Visit (INDEPENDENT_AMBULATORY_CARE_PROVIDER_SITE_OTHER): Payer: BC Managed Care – PPO | Admitting: *Deleted

## 2016-08-16 DIAGNOSIS — Z5181 Encounter for therapeutic drug level monitoring: Secondary | ICD-10-CM

## 2016-08-16 LAB — POCT INR: INR: 2.8

## 2016-08-17 IMAGING — MR MR PROSTATE WO/W CM
23 of 53 series · 23 of 53 positions shown · IV contrast (yes)
Comparison: None.

CLINICAL DATA: Elevated PSA, negative biopsy in 1899

EXAM:
MR PROSTATE WITHOUT AND WITH CONTRAST
TECHNIQUE: Multiplanar multisequence MRI images were obtained of the pelvis
centered about the prostate. Pre and post contrast images were
obtained.
CONTRAST:  15mL MULTIHANCE GADOBENATE DIMEGLUMINE 529 MG/ML IV SOLN

[Series 3: bSSFP fat-sat · axial · 6.0mm · 0.86mm/px · 1 of 44 slices shown]
[im 1/44]
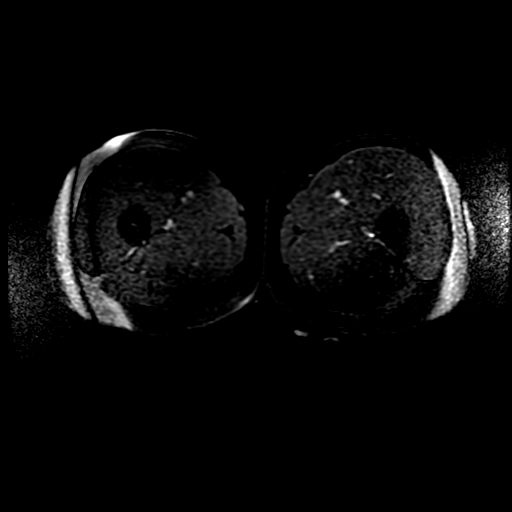

[Series 4: T1 · axial · 8.0mm · 0.70mm/px · 1 of 30 slices shown (1 of 2)]
[im 1/30]
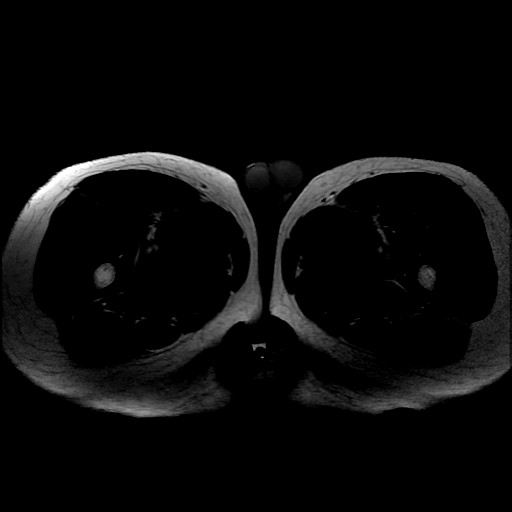

[Series 5: T2 · sagittal · 4.0mm · 0.29mm/px · 1 of 25 slices shown (1 of 3)]
[im 1/25]
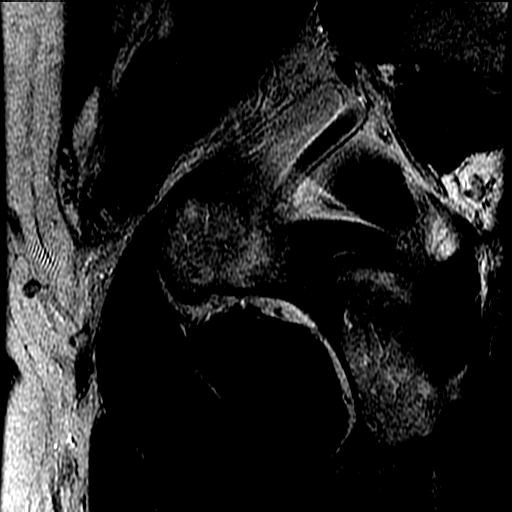

[Series 6: T2 · axial · 3.0mm · 0.29mm/px · 1 of 34 slices shown (2 of 3)]
[im 1/34]
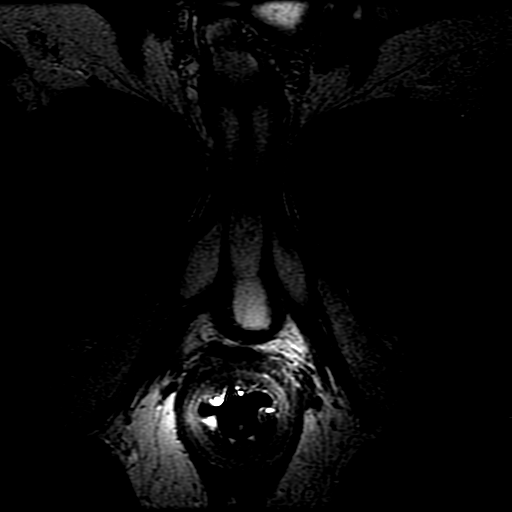

[Series 7: T1 · axial · 3.0mm · 0.29mm/px · 1 of 34 slices shown (2 of 2)]
[im 1/34]
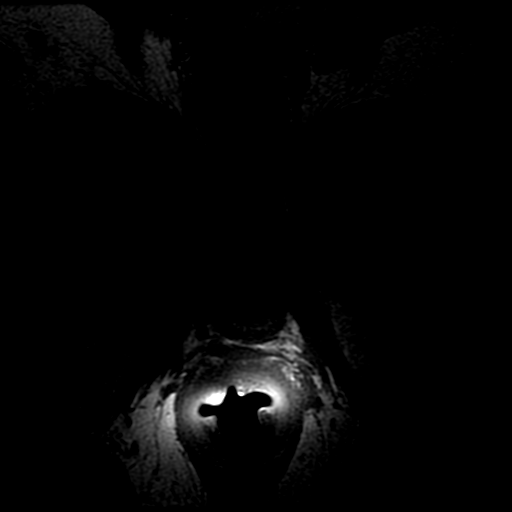

[Series 8: T2 · coronal · 4.0mm · 0.29mm/px · 1 of 24 slices shown (3 of 3)]
[im 1/24]
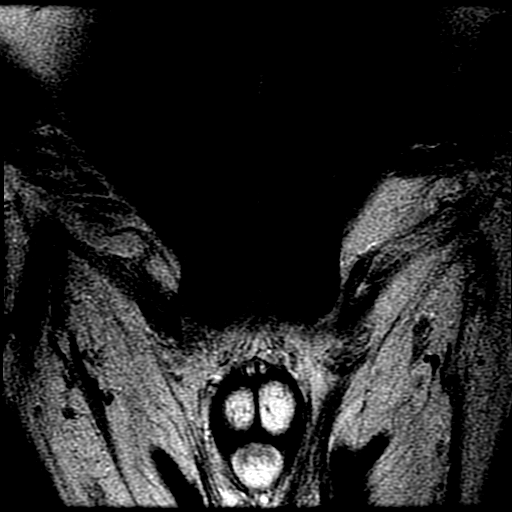

[Series 9: DWI · axial · 3.0mm · 0.59mm/px · 1 of 68 slices shown (1 of 6)]
[im 1/68]
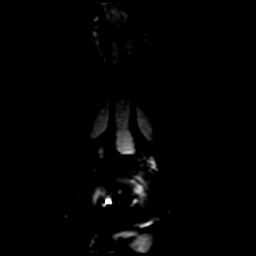

[Series 10: DWI · axial · 3.0mm · 0.59mm/px · 1 of 63 slices shown (2 of 6)]
[im 1/63]
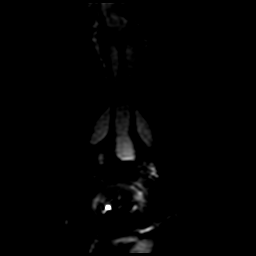

[Series 11: DWI · axial · 3.0mm · 0.59mm/px · 1 of 63 slices shown (3 of 6)]
[im 1/63]
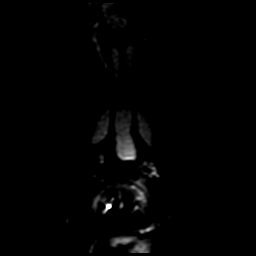

[Series 901: DWI · axial · 3.0mm · 0.59mm/px · 1 of 34 slices shown (4 of 6)]
[im 1/34]
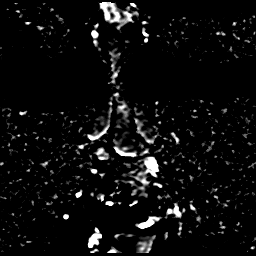

[Series 1000: DWI · axial · 3.0mm · 0.59mm/px · 1 of 34 slices shown (5 of 6)]
[im 1/34]
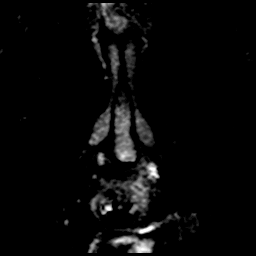

[Series 1100: DWI · axial · 3.0mm · 0.59mm/px · 1 of 34 slices shown (6 of 6)]
[im 1/34]
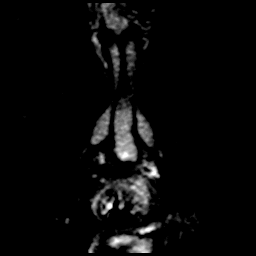

[((id)/(id)/1)-((id)/(id)/1) · axial · 3.0mm · 0.43mm/px · 1 of 82 slices shown (1 of 11)]
[im 1/82]
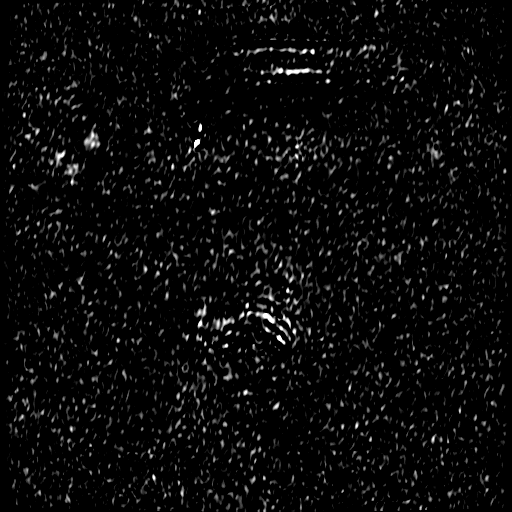

[((id)/(id)/1)-((id)/(id)/1) · axial · 3.0mm · 0.43mm/px · 1 of 84 slices shown (2 of 11)]
[im 1/84]
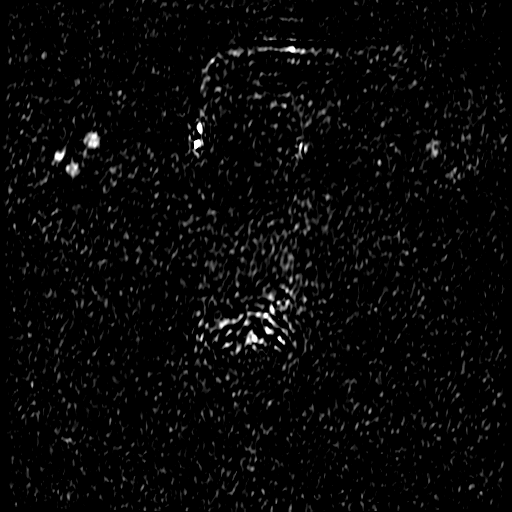

[((id)/(id)/1)-((id)/(id)/1) · axial · 3.0mm · 0.43mm/px · 1 of 84 slices shown (3 of 11)]
[im 1/84]
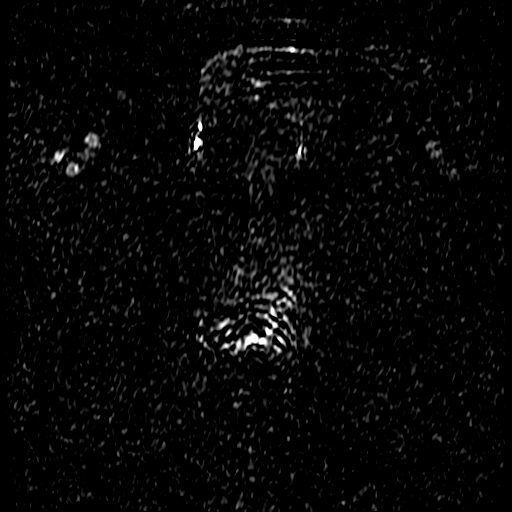

[((id)/(id)/1)-((id)/(id)/1) · axial · 3.0mm · 0.43mm/px · 1 of 84 slices shown (4 of 11)]
[im 1/84]
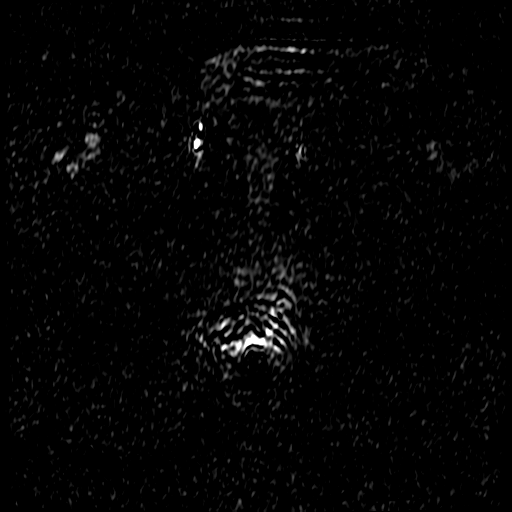

[((id)/(id)/1)-((id)/(id)/1) · axial · 3.0mm · 0.43mm/px · 1 of 84 slices shown (5 of 11)]
[im 1/84]
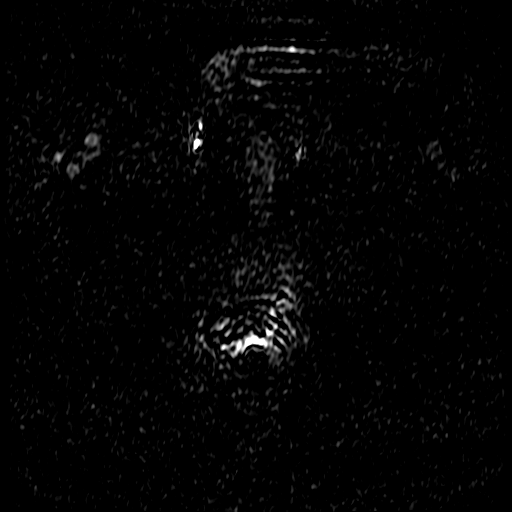

[((id)/(id)/1)-((id)/(id)/1) · axial · 3.0mm · 0.43mm/px · 1 of 84 slices shown (6 of 11)]
[im 1/84]
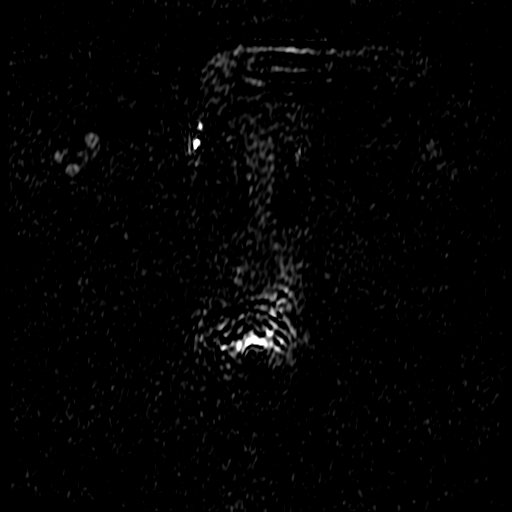

[((id)/(id)/1)-((id)/(id)/1) · axial · 3.0mm · 0.43mm/px · 1 of 84 slices shown (7 of 11)]
[im 1/84]
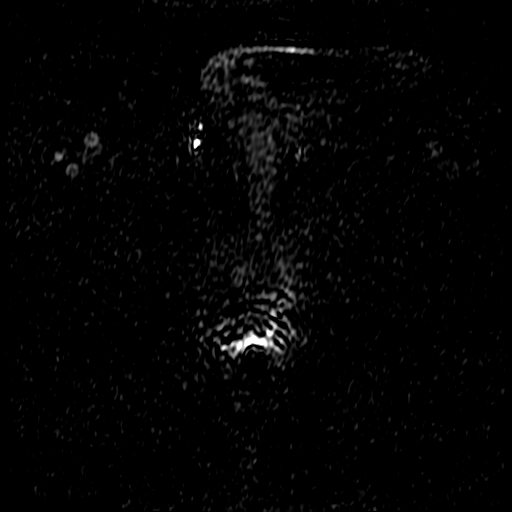

[((id)/(id)/1)-((id)/(id)/1) · axial · 3.0mm · 0.43mm/px · 1 of 84 slices shown (8 of 11)]
[im 1/84]
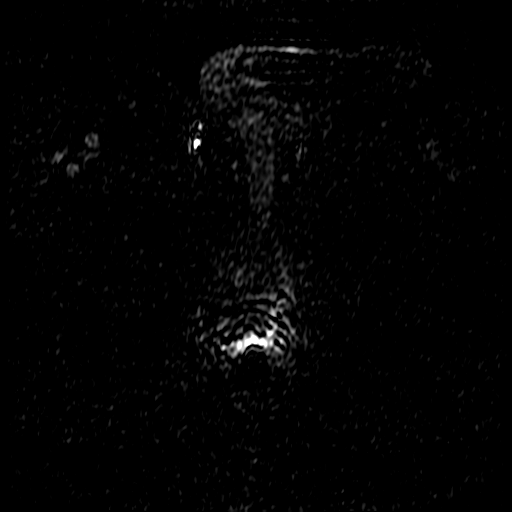

[((id)/(id)/1)-((id)/(id)/1) · axial · 3.0mm · 0.43mm/px · 1 of 84 slices shown (9 of 11)]
[im 1/84]
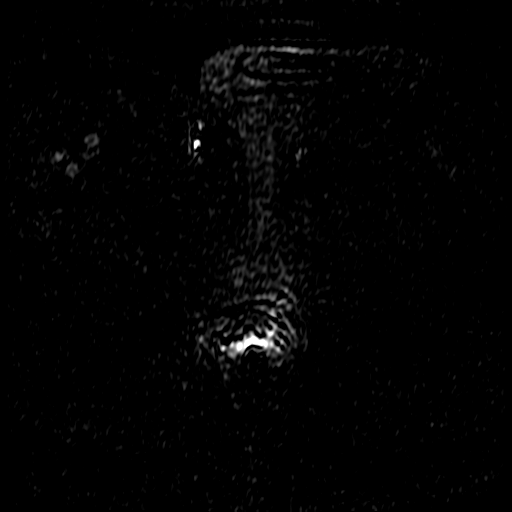

[((id)/(id)/1)-((id)/(id)/1) · axial · 3.0mm · 0.43mm/px · 1 of 84 slices shown (10 of 11)]
[im 1/84]
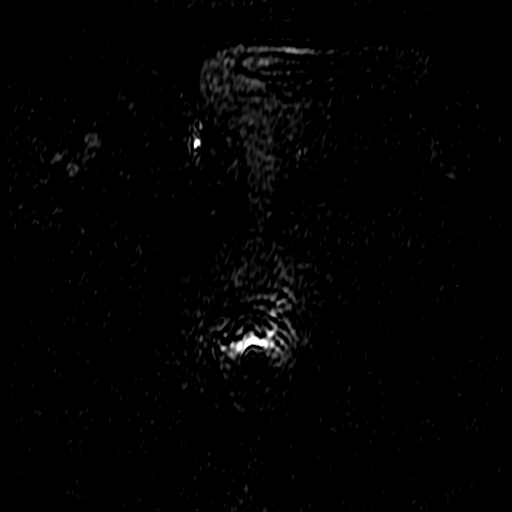

[((id)/(id)/1)-((id)/(id)/1) · axial · 3.0mm · 0.43mm/px · 1 of 84 slices shown (11 of 11)]
[im 1/84]
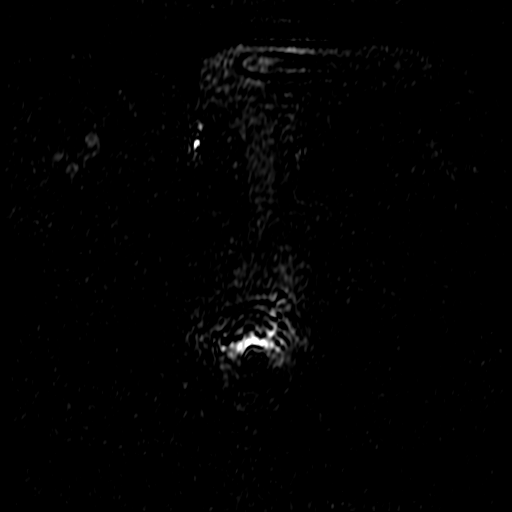

[23 of 53 positions shown; findings below may reference images not displayed]

FINDINGS: Prostate: No findings suggestive of high-grade macroscopic prostate
cancer. Specifically, no restricted diffusion/low ADC.

5 mm focus of low T2 signal in the right posterolateral mid gland to
base (series 6/image 18; series 8/image 6), possibly reflecting low
grade prostate cancer.

Additional possible 5 mm focus of low T2 signal in the medial left
mid gland to base (series 6/image 18), equivocal.

Enlargement/nodularity of the central gland, which indents the base
of the bladder, suggesting BPH. No suspicious central gland
nodularity on T2.

Transcapsular spread:  Absent.

Seminal vesicle involvement: Absent.

Neurovascular bundle involvement: Absent.

Pelvic adenopathy: Absent.

Bone metastasis: Absent.

Other findings: Bladder is mildly thick-walled although
underdistended.
IMPRESSION: No findings suggestive of high-grade macroscopic prostate cancer.

5 mm lesion in the right posterolateral mid gland to base, possibly
reflecting low grade prostate cancer. If biopsy is performed,
consider targeting of this region.

Additional possible 5 mm lesion in the medial left mid gland to
base, equivocal.

No evidence of extracapsular extension, seminal vesicle invasion, or
metastatic disease.

Enlargement of the central gland, compatible with BPH.

## 2016-08-18 NOTE — Progress Notes (Signed)
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 Last Echo in July 2017 showed normal valve function. He denies any significant symptoms of chest pain, shortness of breath, or palpitations. He continues to exercise regularly 5 days a week. 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    Current Outpatient Prescriptions on File Prior to Visit  Medication Sig Dispense Refill  . Ergocalciferol 2000 UNITS TABS Take 2,000 Units by mouth daily.    Marland Kitchen losartan-hydrochlorothiazide (HYZAAR) 50-12.5 MG per tablet Take 1 tablet by mouth daily.     . metoprolol tartrate (LOPRESSOR) 25 MG tablet TAKE 1 TABLET BY MOUTH EVERY DAY 30 tablet 9  . rosuvastatin (CRESTOR) 10 MG tablet TAKE 1 TABLET (10 MG TOTAL) BY MOUTH DAILY. 30 tablet 11  . warfarin (COUMADIN) 5 MG tablet Take 1/2 to 1 tablet daily as directed by coumadin clinic 30 tablet 5   No current facility-administered medications on file prior to visit.     No Known Allergies  Past Medical History:  Diagnosis Date  . Chronic anticoagulation   . HTN (hypertension)   . Hypercholesteremia   . Severe aortic stenosis     Past Surgical History:  Procedure Laterality Date  . AORTIC VALVE REPLACEMENT  2007   #21 MM [facility] VALVE  . VASECTOMY      History  Smoking Status  . Never Smoker  Smokeless Tobacco  . Never Used    History  Alcohol Use No    Family History  Problem Relation Age of Onset  . Hypertension Mother     Review of Systems: As noted history of present illness.  All other systems were reviewed and are negative.  Physical Exam: BP 125/72   Pulse (!) 54   Ht 5\' 10"  (1.778 m)   Wt 165 lb 12.8 oz (75.2 kg)   BMI 23.79 kg/m  He is a pleasant, thin white male in no acute distress.The patient is  alert and oriented x 3.    The HEENT exam is normal. The carotids are 2+ without bruits.  There is no thyromegaly.  There is no JVD.  The lungs are clear.   The heart exam reveals a regular rate with a normal S1. He has a good mechanical aortic valve click. There is no murmur or S3. The PMI is not displaced.   Abdominal exam reveals good bowel sounds.  There is no hepatosplenomegaly or tenderness.   Exam of the legs reveal no clubbing, cyanosis, or edema.The distal pulses are intact.  Cranial nerves II - XII are intact.  Motor and sensory functions are intact.  The gait is normal.  LABORATORY DATA: INR is 2.8 on 08/16/16  LABS reviewed from 04/27/15. Hgb 13.1. CMET normal, Cholesterol 167, Triglycerides 80, HDL 80, LDL 71. TSH normal. From 06/19/16: cholesterol 165, triglycerides 53. HDL 77, LDL 77. Hgb 13.2. CMET and TSH normal.  Echo: 09/11/15: Study Conclusions  - Left ventricle: The cavity size was normal. Wall thickness was   normal. Systolic function was normal. The estimated ejection   fraction was in the range of 55% to 60%. Wall motion was normal;   there were no regional wall motion abnormalities. Left   ventricular diastolic function parameters were normal. - Aortic valve: A mechanical prosthesis  was present and functioning   normally. There was trivial regurgitation. Valve area (VTI): 1.58   cm^2. Valve area (Vmax): 1.62 cm^2. Valve area (Vmean): 1.72   cm^2. - Mitral valve: There was mild regurgitation.  Ecg today shows NSR with LVH. I have personally reviewed and interpreted this study.   Assessment / Plan:  1. Status post mechanical aortic valve replacement. Satisfactory Echo last year.  Clinically stable. Continue chronic anticoagulation. Routine SBE prophylaxis. I will followup again in 6 months.   2. Hypertension, well controlled. Continue Hyzaar and metoprolol.   3. Hypercholesterolemia-on Crestor. Excellent control.

## 2016-08-20 ENCOUNTER — Encounter: Payer: Self-pay | Admitting: Cardiology

## 2016-08-20 ENCOUNTER — Ambulatory Visit (INDEPENDENT_AMBULATORY_CARE_PROVIDER_SITE_OTHER): Payer: BC Managed Care – PPO | Admitting: Cardiology

## 2016-08-20 VITALS — BP 125/72 | HR 54 | Ht 70.0 in | Wt 165.8 lb

## 2016-08-20 DIAGNOSIS — Z7901 Long term (current) use of anticoagulants: Secondary | ICD-10-CM

## 2016-08-20 DIAGNOSIS — Z952 Presence of prosthetic heart valve: Secondary | ICD-10-CM | POA: Diagnosis not present

## 2016-08-20 DIAGNOSIS — I10 Essential (primary) hypertension: Secondary | ICD-10-CM

## 2016-08-20 DIAGNOSIS — I350 Nonrheumatic aortic (valve) stenosis: Secondary | ICD-10-CM | POA: Diagnosis not present

## 2016-08-20 NOTE — Patient Instructions (Signed)
Continue your current therapy  I will see you in 6 months.   

## 2016-09-13 ENCOUNTER — Ambulatory Visit (INDEPENDENT_AMBULATORY_CARE_PROVIDER_SITE_OTHER): Payer: BC Managed Care – PPO | Admitting: *Deleted

## 2016-09-13 DIAGNOSIS — Z5181 Encounter for therapeutic drug level monitoring: Secondary | ICD-10-CM | POA: Diagnosis not present

## 2016-09-13 LAB — POCT INR: INR: 3

## 2016-10-09 ENCOUNTER — Ambulatory Visit (INDEPENDENT_AMBULATORY_CARE_PROVIDER_SITE_OTHER): Payer: BC Managed Care – PPO | Admitting: *Deleted

## 2016-10-09 DIAGNOSIS — Z5181 Encounter for therapeutic drug level monitoring: Secondary | ICD-10-CM

## 2016-10-09 LAB — POCT INR: INR: 2.2

## 2016-10-30 ENCOUNTER — Other Ambulatory Visit: Payer: Self-pay | Admitting: Cardiology

## 2016-10-30 ENCOUNTER — Ambulatory Visit (INDEPENDENT_AMBULATORY_CARE_PROVIDER_SITE_OTHER): Payer: BC Managed Care – PPO | Admitting: *Deleted

## 2016-10-30 DIAGNOSIS — Z5181 Encounter for therapeutic drug level monitoring: Secondary | ICD-10-CM

## 2016-10-30 DIAGNOSIS — I359 Nonrheumatic aortic valve disorder, unspecified: Secondary | ICD-10-CM

## 2016-10-30 LAB — POCT INR: INR: 2.9

## 2016-11-27 ENCOUNTER — Ambulatory Visit (INDEPENDENT_AMBULATORY_CARE_PROVIDER_SITE_OTHER): Payer: BC Managed Care – PPO | Admitting: *Deleted

## 2016-11-27 DIAGNOSIS — Z5181 Encounter for therapeutic drug level monitoring: Secondary | ICD-10-CM

## 2016-11-27 DIAGNOSIS — Z952 Presence of prosthetic heart valve: Secondary | ICD-10-CM | POA: Diagnosis not present

## 2016-11-27 LAB — POCT INR: INR: 2.5

## 2016-12-25 ENCOUNTER — Ambulatory Visit (INDEPENDENT_AMBULATORY_CARE_PROVIDER_SITE_OTHER): Payer: BC Managed Care – PPO

## 2016-12-25 DIAGNOSIS — Z952 Presence of prosthetic heart valve: Secondary | ICD-10-CM | POA: Diagnosis not present

## 2016-12-25 DIAGNOSIS — Z5181 Encounter for therapeutic drug level monitoring: Secondary | ICD-10-CM

## 2016-12-25 LAB — POCT INR: INR: 3

## 2016-12-25 NOTE — Patient Instructions (Signed)
Continue taking 1 tablet daily except 1/2 tablet each Saturday.  Repeat INR in 4 weeks.

## 2016-12-28 ENCOUNTER — Other Ambulatory Visit: Payer: Self-pay | Admitting: Cardiology

## 2016-12-30 NOTE — Telephone Encounter (Signed)
°*  STAT* If patient is at the pharmacy, call can be transferred to refill team.   1. Which medications need to be refilled? (please list name of each medication and dose if known) Generic Coumadin  2. Which pharmacy/location (including street and city if local pharmacy) is medication to be sent to? [facility]. Do they need a 30 day or 90 day supply? 90 and refills

## 2017-01-30 ENCOUNTER — Ambulatory Visit (INDEPENDENT_AMBULATORY_CARE_PROVIDER_SITE_OTHER): Payer: BC Managed Care – PPO | Admitting: *Deleted

## 2017-01-30 DIAGNOSIS — Z5181 Encounter for therapeutic drug level monitoring: Secondary | ICD-10-CM | POA: Diagnosis not present

## 2017-01-30 DIAGNOSIS — Z952 Presence of prosthetic heart valve: Secondary | ICD-10-CM

## 2017-01-30 LAB — POCT INR: INR: 2.4

## 2017-01-30 NOTE — Patient Instructions (Signed)
Description   Today Dec 20th take another 1/2 tablet making total of coumadin 7.5mg  today then continue taking 1 tablet daily except 1/2 tablet each Saturday.  Repeat INR in 4 weeks.

## 2017-02-13 ENCOUNTER — Ambulatory Visit (INDEPENDENT_AMBULATORY_CARE_PROVIDER_SITE_OTHER): Payer: BC Managed Care – PPO | Admitting: *Deleted

## 2017-02-13 DIAGNOSIS — Z5181 Encounter for therapeutic drug level monitoring: Secondary | ICD-10-CM

## 2017-02-13 LAB — POCT INR: INR: 2.4

## 2017-02-13 NOTE — Patient Instructions (Addendum)
Description   Today take an extra 1/2 tablet. Then start taking 1 tablet daily.   Repeat INR in 2 weeks. Call us with any medication changes or concerns # [phone] [facility] # [phone].

## 2017-02-21 ENCOUNTER — Other Ambulatory Visit: Payer: Self-pay | Admitting: Cardiology

## 2017-02-26 ENCOUNTER — Ambulatory Visit (INDEPENDENT_AMBULATORY_CARE_PROVIDER_SITE_OTHER): Payer: BC Managed Care – PPO | Admitting: *Deleted

## 2017-02-26 DIAGNOSIS — Z5181 Encounter for therapeutic drug level monitoring: Secondary | ICD-10-CM

## 2017-02-26 LAB — POCT INR: INR: 3

## 2017-02-26 NOTE — Patient Instructions (Signed)
Description   Continue taking 1 tablet daily.  Repeat INR in 3 weeks. Call us with any medication changes or concerns # [phone] [facility] # [phone].

## 2017-03-19 ENCOUNTER — Ambulatory Visit (INDEPENDENT_AMBULATORY_CARE_PROVIDER_SITE_OTHER): Payer: BC Managed Care – PPO

## 2017-03-19 DIAGNOSIS — Z952 Presence of prosthetic heart valve: Secondary | ICD-10-CM

## 2017-03-19 DIAGNOSIS — I350 Nonrheumatic aortic (valve) stenosis: Secondary | ICD-10-CM | POA: Diagnosis not present

## 2017-03-19 DIAGNOSIS — Z5181 Encounter for therapeutic drug level monitoring: Secondary | ICD-10-CM

## 2017-03-19 LAB — POCT INR: INR: 2.4

## 2017-03-19 NOTE — Patient Instructions (Signed)
Description   Start taking 1 tablet daily except 1.5 tablets on Wednesdays.  Repeat INR in 3 weeks. Call us with any medication changes or concerns # [phone] [facility] # [phone].

## 2017-04-09 ENCOUNTER — Ambulatory Visit (INDEPENDENT_AMBULATORY_CARE_PROVIDER_SITE_OTHER): Payer: BC Managed Care – PPO | Admitting: *Deleted

## 2017-04-09 DIAGNOSIS — Z5181 Encounter for therapeutic drug level monitoring: Secondary | ICD-10-CM

## 2017-04-09 DIAGNOSIS — Z952 Presence of prosthetic heart valve: Secondary | ICD-10-CM

## 2017-04-09 LAB — POCT INR: INR: 2.8

## 2017-04-09 NOTE — Patient Instructions (Signed)
Description   Continue taking 1 tablet daily except 1.5 tablets on Wednesdays.  Repeat INR in 4 weeks. Call us with any medication changes or concerns # [phone] [facility] # [phone].

## 2017-04-20 ENCOUNTER — Other Ambulatory Visit: Payer: Self-pay | Admitting: Cardiology

## 2017-05-06 ENCOUNTER — Ambulatory Visit (INDEPENDENT_AMBULATORY_CARE_PROVIDER_SITE_OTHER): Payer: BC Managed Care – PPO | Admitting: *Deleted

## 2017-05-06 DIAGNOSIS — Z5181 Encounter for therapeutic drug level monitoring: Secondary | ICD-10-CM

## 2017-05-06 LAB — POCT INR: INR: 2.4

## 2017-05-06 NOTE — Patient Instructions (Signed)
Description   Today take 1.5 tablets, then Continue taking 1 tablet daily except 1.5 tablets on Wednesdays.  Repeat INR in 2 weeks. Call us with any medication changes or concerns # [phone] [facility] # [phone].

## 2017-06-04 ENCOUNTER — Ambulatory Visit (INDEPENDENT_AMBULATORY_CARE_PROVIDER_SITE_OTHER): Payer: BC Managed Care – PPO | Admitting: *Deleted

## 2017-06-04 DIAGNOSIS — Z5181 Encounter for therapeutic drug level monitoring: Secondary | ICD-10-CM | POA: Diagnosis not present

## 2017-06-04 LAB — POCT INR: INR: 2.4

## 2017-06-04 NOTE — Patient Instructions (Signed)
Description   Today take another 1/2 tablet (already taken today's dose) then start taking 1 tablet daily except 1.5 tablets on Sundays and Wednesdays.  Repeat INR in 2 weeks. Call us with any medication changes or concerns # [phone] [facility] # [phone].

## 2017-06-20 ENCOUNTER — Other Ambulatory Visit: Payer: Self-pay | Admitting: Cardiology

## 2017-06-20 ENCOUNTER — Ambulatory Visit (INDEPENDENT_AMBULATORY_CARE_PROVIDER_SITE_OTHER): Payer: BC Managed Care – PPO | Admitting: Pharmacist

## 2017-06-20 DIAGNOSIS — Z5181 Encounter for therapeutic drug level monitoring: Secondary | ICD-10-CM | POA: Diagnosis not present

## 2017-06-20 LAB — POCT INR: INR: 3.5

## 2017-06-20 NOTE — Patient Instructions (Signed)
Description   Continue 1 tablet daily except 1.5 tablets on Sundays and Wednesdays.  Repeat INR in 3 weeks. Call us with any medication changes or concerns # [phone] [facility] # [phone].

## 2017-07-11 ENCOUNTER — Ambulatory Visit (INDEPENDENT_AMBULATORY_CARE_PROVIDER_SITE_OTHER): Payer: BC Managed Care – PPO | Admitting: *Deleted

## 2017-07-11 DIAGNOSIS — Z952 Presence of prosthetic heart valve: Secondary | ICD-10-CM

## 2017-07-11 DIAGNOSIS — Z5181 Encounter for therapeutic drug level monitoring: Secondary | ICD-10-CM

## 2017-07-11 LAB — POCT INR: INR: 4.8 — AB (ref 2.0–3.0)

## 2017-07-11 NOTE — Patient Instructions (Addendum)
  Description   Do not take coumadin today May 31st then tomorrow June 1st take 1/2 tablet (2.5mg ) then continue 1 tablet daily except 1 and 1/2 tablets on Sundays and Wednesdays.  Repeat INR in 2 weeks. Call us with any medication changes or concerns # [phone] [facility] # [phone]. Do good serving of greens today then keep intake of greens consistent

## 2017-07-25 ENCOUNTER — Ambulatory Visit (INDEPENDENT_AMBULATORY_CARE_PROVIDER_SITE_OTHER): Payer: BC Managed Care – PPO | Admitting: *Deleted

## 2017-07-25 DIAGNOSIS — Z5181 Encounter for therapeutic drug level monitoring: Secondary | ICD-10-CM | POA: Diagnosis not present

## 2017-07-25 LAB — POCT INR: INR: 3.9 — AB (ref 2.0–3.0)

## 2017-07-25 NOTE — Patient Instructions (Signed)
Description   Skip today's dose, then change your dosage to  1 tablet daily except 1.5 tablets on Wednesdays.  Repeat INR in 2 weeks. Call us with any medication changes or concerns # [phone] [facility] # [phone]. keep intake of greens consistent

## 2017-08-08 ENCOUNTER — Ambulatory Visit (INDEPENDENT_AMBULATORY_CARE_PROVIDER_SITE_OTHER): Payer: BC Managed Care – PPO | Admitting: *Deleted

## 2017-08-08 DIAGNOSIS — Z5181 Encounter for therapeutic drug level monitoring: Secondary | ICD-10-CM | POA: Diagnosis not present

## 2017-08-08 LAB — POCT INR: INR: 3.5 — AB (ref 2.0–3.0)

## 2017-08-08 NOTE — Patient Instructions (Signed)
Description   Continue taking 1 tablet daily except 1.5 tablets on Wednesdays.  Repeat INR in 3 weeks. Call us with any medication changes or concerns # [phone] [facility] # [phone]. keep intake of greens consistent

## 2017-09-03 ENCOUNTER — Ambulatory Visit (INDEPENDENT_AMBULATORY_CARE_PROVIDER_SITE_OTHER): Payer: BC Managed Care – PPO | Admitting: Pharmacist

## 2017-09-03 DIAGNOSIS — Z5181 Encounter for therapeutic drug level monitoring: Secondary | ICD-10-CM | POA: Diagnosis not present

## 2017-09-03 LAB — POCT INR: INR: 3.3 — AB (ref 2.0–3.0)

## 2017-09-03 NOTE — Patient Instructions (Signed)
Description   Continue taking 1 tablet daily except 1.5 tablets on Wednesdays.  Repeat INR in 4 weeks. Call us with any medication changes or concerns # [phone] [facility] # [phone]. keep intake of greens consistent

## 2017-10-02 ENCOUNTER — Ambulatory Visit (INDEPENDENT_AMBULATORY_CARE_PROVIDER_SITE_OTHER): Payer: BC Managed Care – PPO | Admitting: *Deleted

## 2017-10-02 DIAGNOSIS — Z5181 Encounter for therapeutic drug level monitoring: Secondary | ICD-10-CM

## 2017-10-02 LAB — POCT INR: INR: 3.7 — AB (ref 2.0–3.0)

## 2017-10-02 NOTE — Patient Instructions (Signed)
Description   Today take 1/2 tablet then continue taking 1 tablet daily except 1.5 tablets on Wednesdays.  Repeat INR in 4 weeks. Call us with any medication changes or concerns # [phone] [facility] # [phone]. keep intake of greens consistent

## 2017-10-21 ENCOUNTER — Other Ambulatory Visit: Payer: Self-pay | Admitting: Cardiology

## 2017-10-25 NOTE — Progress Notes (Signed)
Nathan Frye Date of Birth: Jan 07, 1951   History of Present Illness: Nathan Frye is seen today for followup of his aortic valve disease. 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 Last Echo in July 2017 showed normal valve function.   On follow up today he is doing very well. He denies any  symptoms of chest pain, shortness of breath, or palpitations. He continues to exercise regularly 3-5 days a week. 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    Current Outpatient Medications on File Prior to Visit  Medication Sig Dispense Refill  . Ergocalciferol 2000 UNITS TABS Take 2,000 Units by mouth daily.    Marland Kitchen losartan-hydrochlorothiazide (HYZAAR) 50-12.5 MG per tablet Take 1 tablet by mouth daily.     . metoprolol tartrate (LOPRESSOR) 25 MG tablet TAKE 1 TABLET BY MOUTH EVERY DAY 30 tablet 9  . rosuvastatin (CRESTOR) 10 MG tablet TAKE 1 TABLET (10 MG TOTAL) BY MOUTH DAILY. 30 tablet 5  . warfarin (COUMADIN) 5 MG tablet Take 1  To 1 and 1/2 tablets daily as directed by coumadin clinic 45 tablet 5   No current facility-administered medications on file prior to visit.     No Known Allergies  Past Medical History:  Diagnosis Date  . Chronic anticoagulation   . HTN (hypertension)   . Hypercholesteremia   . Severe aortic stenosis     Past Surgical History:  Procedure Laterality Date  . AORTIC VALVE REPLACEMENT  2007   #21 MM [facility] VALVE  . VASECTOMY      Social History   Tobacco Use  Smoking Status Never Smoker  Smokeless Tobacco Never Used    Social History   Substance and Sexual Activity  Alcohol Use No  . Alcohol/week: 0.0 standard drinks    Family History  Problem Relation Age of Onset  . Hypertension Mother     Review of Systems: As noted history of present illness.  All other systems were reviewed and are negative.  Physical Exam: BP 128/68   Pulse 69   Ht 5\' 10"   (1.778 m)   Wt 163 lb 6.4 oz (74.1 kg)   BMI 23.45 kg/m  GENERAL:  Well appearing BM in NAD HEENT:  PERRL, EOMI, sclera are clear. Oropharynx is clear. NECK:  No jugular venous distention, carotid upstroke brisk and symmetric, no bruits, no thyromegaly or adenopathy LUNGS:  Clear to auscultation bilaterally CHEST:  Unremarkable HEART:  RRR,  PMI not displaced or sustained,S1 and S2 within normal limits, no S3, no S4: good mechanical AV click. Soft 2/6 systolic murmur at the apex. ABD:  Soft, nontender. BS +, no masses or bruits. No hepatomegaly, no splenomegaly EXT:  2 + pulses throughout, no edema, no cyanosis no clubbing SKIN:  Warm and dry.  No rashes NEURO:  Alert and oriented x 3. Cranial nerves II through XII intact. PSYCH:  Cognitively intact    LABORATORY DATA: INR is 2.8 on 08/16/16  LABS reviewed from 04/27/15. Hgb 13.1. CMET normal, Cholesterol 167, Triglycerides 80, HDL 80, LDL 71. TSH normal. From 06/19/16: cholesterol 165, triglycerides 53. HDL 77, LDL 77. Hgb 13.2. CMET and TSH normal. Dated 09/16/17: Hgb 12.7. CMET normal. Cholesterol 175, triglycerides 64, HDL 79, LDL 83. TSH normal.   Ecg today shows NSR rate 69. LVH. I have personally reviewed and interpreted this study.   Echo: 09/11/15: Study Conclusions  - Left ventricle: The  cavity size was normal. Wall thickness was   normal. Systolic function was normal. The estimated ejection   fraction was in the range of 55% to 60%. Wall motion was normal;   there were no regional wall motion abnormalities. Left   ventricular diastolic function parameters were normal. - Aortic valve: A mechanical prosthesis was present and functioning   normally. There was trivial regurgitation. Valve area (VTI): 1.58   cm^2. Valve area (Vmax): 1.62 cm^2. Valve area (Vmean): 1.72   cm^2. - Mitral valve: There was mild regurgitation.    Assessment / Plan:  1. Status post mechanical aortic valve replacement. Satisfactory Echo 2017.   Clinically stable. Continue chronic anticoagulation. Check INR today. Routine SBE prophylaxis. I will followup again in one year  2. Hypertension, well controlled. Continue Hyzaar and metoprolol.   3. Hypercholesterolemia-on Crestor. Excellent control.

## 2017-10-28 ENCOUNTER — Ambulatory Visit (INDEPENDENT_AMBULATORY_CARE_PROVIDER_SITE_OTHER): Payer: BC Managed Care – PPO | Admitting: Pharmacist Clinician (PhC)/ Clinical Pharmacy Specialist

## 2017-10-28 ENCOUNTER — Encounter: Payer: Self-pay | Admitting: Cardiology

## 2017-10-28 ENCOUNTER — Ambulatory Visit: Payer: BC Managed Care – PPO | Admitting: Cardiology

## 2017-10-28 VITALS — BP 128/68 | HR 69 | Ht 70.0 in | Wt 163.4 lb

## 2017-10-28 DIAGNOSIS — I10 Essential (primary) hypertension: Secondary | ICD-10-CM

## 2017-10-28 DIAGNOSIS — Z952 Presence of prosthetic heart valve: Secondary | ICD-10-CM

## 2017-10-28 DIAGNOSIS — Z7901 Long term (current) use of anticoagulants: Secondary | ICD-10-CM | POA: Diagnosis not present

## 2017-10-28 DIAGNOSIS — E7800 Pure hypercholesterolemia, unspecified: Secondary | ICD-10-CM

## 2017-10-28 DIAGNOSIS — Z5181 Encounter for therapeutic drug level monitoring: Secondary | ICD-10-CM | POA: Diagnosis not present

## 2017-10-28 LAB — POCT INR: INR: 4.2 — AB (ref 2.0–3.0)

## 2017-10-28 NOTE — Patient Instructions (Signed)
Continue your current therapy  I will see you in one year   

## 2017-11-14 ENCOUNTER — Ambulatory Visit (INDEPENDENT_AMBULATORY_CARE_PROVIDER_SITE_OTHER): Payer: BC Managed Care – PPO | Admitting: *Deleted

## 2017-11-14 DIAGNOSIS — Z5181 Encounter for therapeutic drug level monitoring: Secondary | ICD-10-CM | POA: Diagnosis not present

## 2017-11-14 LAB — POCT INR: INR: 2.5 (ref 2.0–3.0)

## 2017-11-14 NOTE — Patient Instructions (Signed)
Description   Today take 1.5 tablets then continue 1 tablet daily. Repeat INR in 3 weeks. Call us with any medication changes or concerns # [phone] [facility] # [phone]. keep intake of greens consistent

## 2017-12-04 ENCOUNTER — Ambulatory Visit (INDEPENDENT_AMBULATORY_CARE_PROVIDER_SITE_OTHER): Payer: BC Managed Care – PPO | Admitting: *Deleted

## 2017-12-04 DIAGNOSIS — Z5181 Encounter for therapeutic drug level monitoring: Secondary | ICD-10-CM | POA: Diagnosis not present

## 2017-12-04 LAB — POCT INR: INR: 3.1 — AB (ref 2.0–3.0)

## 2017-12-04 NOTE — Patient Instructions (Signed)
Description   Continue 1 tablet daily. Repeat INR in 4 weeks. Call us with any medication changes or concerns # [phone] [facility] # [phone]. keep intake of greens consistent     

## 2017-12-07 ENCOUNTER — Other Ambulatory Visit: Payer: Self-pay | Admitting: Cardiology

## 2017-12-08 ENCOUNTER — Other Ambulatory Visit: Payer: Self-pay | Admitting: Cardiology

## 2018-01-01 ENCOUNTER — Ambulatory Visit (INDEPENDENT_AMBULATORY_CARE_PROVIDER_SITE_OTHER): Payer: BC Managed Care – PPO | Admitting: *Deleted

## 2018-01-01 DIAGNOSIS — Z5181 Encounter for therapeutic drug level monitoring: Secondary | ICD-10-CM

## 2018-01-01 LAB — POCT INR: INR: 2.8 (ref 2.0–3.0)

## 2018-01-01 NOTE — Patient Instructions (Signed)
Description   Continue 1 tablet daily. Repeat INR in 4 weeks. Call us with any medication changes or concerns # [phone] [facility] # [phone]. keep intake of greens consistent

## 2018-01-29 ENCOUNTER — Ambulatory Visit (INDEPENDENT_AMBULATORY_CARE_PROVIDER_SITE_OTHER): Payer: BC Managed Care – PPO | Admitting: *Deleted

## 2018-01-29 DIAGNOSIS — Z5181 Encounter for therapeutic drug level monitoring: Secondary | ICD-10-CM | POA: Diagnosis not present

## 2018-01-29 LAB — POCT INR: INR: 3.2 — AB (ref 2.0–3.0)

## 2018-01-29 NOTE — Patient Instructions (Signed)
Description   Continue 1 tablet daily. Repeat INR in 4 weeks. Call us with any medication changes or concerns # [phone] [facility] # [phone]. keep intake of greens consistent

## 2018-02-26 ENCOUNTER — Ambulatory Visit (INDEPENDENT_AMBULATORY_CARE_PROVIDER_SITE_OTHER): Payer: BC Managed Care – PPO | Admitting: Pharmacist

## 2018-02-26 DIAGNOSIS — Z5181 Encounter for therapeutic drug level monitoring: Secondary | ICD-10-CM

## 2018-02-26 LAB — POCT INR: INR: 2.6 (ref 2.0–3.0)

## 2018-02-26 NOTE — Patient Instructions (Signed)
Description   Continue 1 tablet daily. Repeat INR in 4 weeks. Call us with any medication changes or concerns # [phone] [facility] # [phone]. keep intake of greens consistent

## 2018-03-20 ENCOUNTER — Other Ambulatory Visit: Payer: Self-pay | Admitting: Cardiology

## 2018-03-30 ENCOUNTER — Ambulatory Visit (INDEPENDENT_AMBULATORY_CARE_PROVIDER_SITE_OTHER): Payer: BC Managed Care – PPO

## 2018-03-30 DIAGNOSIS — Z5181 Encounter for therapeutic drug level monitoring: Secondary | ICD-10-CM | POA: Diagnosis not present

## 2018-03-30 LAB — POCT INR: INR: 2.5 (ref 2.0–3.0)

## 2018-03-30 NOTE — Patient Instructions (Signed)
Description   Continue on same dosage 1 tablet daily. Repeat INR in 4 weeks. Call us with any medication changes or concerns # [phone] [facility] # [phone]. keep intake of greens consistent

## 2018-04-28 ENCOUNTER — Other Ambulatory Visit: Payer: Self-pay

## 2018-04-28 ENCOUNTER — Ambulatory Visit (INDEPENDENT_AMBULATORY_CARE_PROVIDER_SITE_OTHER): Payer: BC Managed Care – PPO | Admitting: *Deleted

## 2018-04-28 DIAGNOSIS — Z5181 Encounter for therapeutic drug level monitoring: Secondary | ICD-10-CM | POA: Diagnosis not present

## 2018-04-28 LAB — POCT INR: INR: 2 (ref 2.0–3.0)

## 2018-04-28 NOTE — Patient Instructions (Addendum)
Description   Today take 1.5 tablets then continue on same dosage 1 tablet daily. Repeat INR in 4 weeks. Call us with any medication changes or concerns # [phone] [facility] # [phone]. keep intake of greens consistent

## 2018-05-25 ENCOUNTER — Telehealth: Payer: Self-pay

## 2018-05-25 NOTE — Telephone Encounter (Signed)

## 2018-05-25 NOTE — Telephone Encounter (Signed)
lmom for prescreen/drive thru aware 

## 2018-05-26 ENCOUNTER — Other Ambulatory Visit: Payer: Self-pay

## 2018-05-26 ENCOUNTER — Ambulatory Visit (INDEPENDENT_AMBULATORY_CARE_PROVIDER_SITE_OTHER): Payer: BC Managed Care – PPO

## 2018-05-26 DIAGNOSIS — Z5181 Encounter for therapeutic drug level monitoring: Secondary | ICD-10-CM | POA: Diagnosis not present

## 2018-05-26 LAB — POCT INR: INR: 2.1 (ref 2.0–3.0)

## 2018-05-26 NOTE — Patient Instructions (Signed)
Description   Called spoke with pt, advised to take 1.5 tablets today, then start taking 1 tablet daily except 1.5 tablets on Wednesdays. Repeat INR in 3 weeks. Call us with any medication changes or concerns # [phone] [facility] # [phone]. keep intake of greens consistent.

## 2018-06-15 ENCOUNTER — Telehealth: Payer: Self-pay

## 2018-06-15 NOTE — Telephone Encounter (Signed)

## 2018-06-16 ENCOUNTER — Other Ambulatory Visit: Payer: Self-pay

## 2018-06-16 ENCOUNTER — Ambulatory Visit (INDEPENDENT_AMBULATORY_CARE_PROVIDER_SITE_OTHER): Payer: BC Managed Care – PPO | Admitting: *Deleted

## 2018-06-16 DIAGNOSIS — Z5181 Encounter for therapeutic drug level monitoring: Secondary | ICD-10-CM | POA: Diagnosis not present

## 2018-06-16 DIAGNOSIS — Z952 Presence of prosthetic heart valve: Secondary | ICD-10-CM | POA: Diagnosis not present

## 2018-06-16 LAB — POCT INR: INR: 2.3 (ref 2.0–3.0)

## 2018-06-29 ENCOUNTER — Telehealth: Payer: Self-pay

## 2018-06-29 NOTE — Telephone Encounter (Signed)

## 2018-06-30 ENCOUNTER — Ambulatory Visit (INDEPENDENT_AMBULATORY_CARE_PROVIDER_SITE_OTHER): Payer: BC Managed Care – PPO | Admitting: *Deleted

## 2018-06-30 ENCOUNTER — Other Ambulatory Visit: Payer: Self-pay

## 2018-06-30 DIAGNOSIS — Z5181 Encounter for therapeutic drug level monitoring: Secondary | ICD-10-CM | POA: Diagnosis not present

## 2018-06-30 LAB — POCT INR: INR: 2.5 (ref 2.0–3.0)

## 2018-06-30 NOTE — Patient Instructions (Signed)
Description   Called spoke with pt, advised to take 2 tablets today, then continue taking 1 tablet daily except 1.5 tablets on Tuesdays and Saturdays.  Repeat INR in 3 weeks. Call us with any medication changes or concerns # [phone] [facility] # [phone].

## 2018-07-14 ENCOUNTER — Telehealth: Payer: Self-pay

## 2018-07-14 NOTE — Telephone Encounter (Signed)
lmom to move appt/covid screen

## 2018-07-20 ENCOUNTER — Telehealth: Payer: Self-pay | Admitting: *Deleted

## 2018-07-20 NOTE — Telephone Encounter (Signed)
1. COVID-19 Pre-Screening Questions:  . In the past 7 to 10 days have you had a cough,  shortness of breath, headache, congestion, fever (100 or greater) body aches, chills, sore throat, or sudden loss of taste or sense of smell?  No . Have you been around anyone with known Covid 19. No . Have you been around anyone who is awaiting Covid 19 test results in the past 7 to 10 days? No . Have you been around anyone who has been exposed to Covid 19, or has mentioned symptoms of Covid 19 within the past 7 to 10 days? No    2. Pt advised of visitor restrictions (no visitors allowed except if needed to conduct the visit). Also advised to arrive at appointment time and wear a mask.   Pt received a msg to call to r/s since now in office appts. Pt was r/s to next 72 hr hold day from today and prescreening complete.

## 2018-07-22 ENCOUNTER — Other Ambulatory Visit: Payer: Self-pay

## 2018-07-22 ENCOUNTER — Ambulatory Visit (INDEPENDENT_AMBULATORY_CARE_PROVIDER_SITE_OTHER): Payer: BC Managed Care – PPO | Admitting: *Deleted

## 2018-07-22 DIAGNOSIS — Z5181 Encounter for therapeutic drug level monitoring: Secondary | ICD-10-CM

## 2018-07-22 LAB — POCT INR: INR: 2.6 (ref 2.0–3.0)

## 2018-07-22 NOTE — Patient Instructions (Signed)
Description    Continue taking 1 tablet daily except 1.5 tablets on Tuesdays and Saturdays.  Repeat INR in 4 weeks. Call us with any medication changes or concerns # [phone] [facility] # [phone].

## 2018-08-12 ENCOUNTER — Telehealth: Payer: Self-pay

## 2018-08-12 NOTE — Telephone Encounter (Signed)

## 2018-08-19 ENCOUNTER — Other Ambulatory Visit: Payer: Self-pay

## 2018-08-19 ENCOUNTER — Ambulatory Visit (INDEPENDENT_AMBULATORY_CARE_PROVIDER_SITE_OTHER): Payer: BC Managed Care – PPO | Admitting: *Deleted

## 2018-08-19 DIAGNOSIS — Z5181 Encounter for therapeutic drug level monitoring: Secondary | ICD-10-CM | POA: Diagnosis not present

## 2018-08-19 LAB — POCT INR: INR: 2 (ref 2.0–3.0)

## 2018-08-19 NOTE — Patient Instructions (Signed)
Description   Today take 1.5 tablets then continue taking 1 tablet daily except 1.5 tablets on Tuesdays and Saturdays.  Repeat INR in 4 weeks. Call us with any medication changes or concerns # [phone] [facility] # [phone].

## 2018-09-07 ENCOUNTER — Other Ambulatory Visit: Payer: Self-pay | Admitting: *Deleted

## 2018-09-07 MED ORDER — WARFARIN SODIUM 5 MG PO TABS: ORAL_TABLET | ORAL | 3 refills | Status: DC

## 2018-09-08 ENCOUNTER — Telehealth: Payer: Self-pay | Admitting: Pharmacist

## 2018-09-08 NOTE — Telephone Encounter (Signed)
1. COVID-19 Pre-Screening Questions:  . In the past 7 to 10 days have you had a cough,  shortness of breath, headache, congestion, fever (100 or greater) body aches, chills, sore throat, or sudden loss of taste or sense of smell?  no . Have you been around anyone with known Covid 19.  no . Have you been around anyone who is awaiting Covid 19 test results in the past 7 to 10 days?  no . Have you been around anyone who has been exposed to Covid 19, or has mentioned symptoms of Covid 19 within the past 7 to 10 days?  No  Pt was tested and tested neg-no symptoms    2. Pt advised of visitor restrictions (no visitors allowed except if needed to conduct the visit). Also advised to arrive at appointment time and wear a mask.

## 2018-09-09 ENCOUNTER — Ambulatory Visit (INDEPENDENT_AMBULATORY_CARE_PROVIDER_SITE_OTHER): Payer: BC Managed Care – PPO | Admitting: *Deleted

## 2018-09-09 ENCOUNTER — Other Ambulatory Visit: Payer: Self-pay

## 2018-09-09 DIAGNOSIS — Z5181 Encounter for therapeutic drug level monitoring: Secondary | ICD-10-CM

## 2018-09-09 LAB — POCT INR: INR: 2.8 (ref 2.0–3.0)

## 2018-09-09 NOTE — Patient Instructions (Signed)
Description   Continue taking 1 tablet daily except 1.5 tablets on Tuesdays, Thursdays, and Saturdays.  Repeat INR in 4 weeks. Call us with any medication changes or concerns # [phone] [facility] # [phone].

## 2018-09-12 ENCOUNTER — Other Ambulatory Visit: Payer: Self-pay | Admitting: Cardiology

## 2018-09-27 ENCOUNTER — Other Ambulatory Visit: Payer: Self-pay | Admitting: Cardiology

## 2018-10-02 ENCOUNTER — Other Ambulatory Visit: Payer: Self-pay

## 2018-10-02 ENCOUNTER — Emergency Department (HOSPITAL_COMMUNITY)
Admission: EM | Admit: 2018-10-02 | Discharge: 2018-10-03 | Disposition: A | Discharge status: Home / Self Care | Payer: BC Managed Care – PPO | Attending: Emergency Medicine | Admitting: Emergency Medicine

## 2018-10-02 ENCOUNTER — Encounter (HOSPITAL_COMMUNITY): Payer: Self-pay | Admitting: *Deleted

## 2018-10-02 DIAGNOSIS — Z7901 Long term (current) use of anticoagulants: Secondary | ICD-10-CM | POA: Diagnosis not present

## 2018-10-02 DIAGNOSIS — N3000 Acute cystitis without hematuria: Secondary | ICD-10-CM | POA: Insufficient documentation

## 2018-10-02 DIAGNOSIS — Z20828 Contact with and (suspected) exposure to other viral communicable diseases: Secondary | ICD-10-CM | POA: Diagnosis not present

## 2018-10-02 DIAGNOSIS — Z79899 Other long term (current) drug therapy: Secondary | ICD-10-CM | POA: Insufficient documentation

## 2018-10-02 DIAGNOSIS — I10 Essential (primary) hypertension: Secondary | ICD-10-CM | POA: Diagnosis not present

## 2018-10-02 DIAGNOSIS — R509 Fever, unspecified: Secondary | ICD-10-CM | POA: Diagnosis present

## 2018-10-02 LAB — COMPREHENSIVE METABOLIC PANEL
ALT: 24 U/L (ref 0–44)
AST: 38 U/L (ref 15–41)
Albumin: 3.7 g/dL (ref 3.5–5.0)
Alkaline Phosphatase: 55 U/L (ref 38–126)
Anion gap: 10 (ref 5–15)
BUN: 34 mg/dL — ABNORMAL HIGH (ref 8–23)
CO2: 25 mmol/L (ref 22–32)
Calcium: 9.2 mg/dL (ref 8.9–10.3)
Chloride: 100 mmol/L (ref 98–111)
Creatinine, Ser: 2.29 mg/dL — ABNORMAL HIGH (ref 0.61–1.24)
GFR calc Af Amer: 33 mL/min — ABNORMAL LOW (ref >60)
GFR calc non Af Amer: 28 mL/min — ABNORMAL LOW (ref >60)
Glucose, Bld: 127 mg/dL — ABNORMAL HIGH (ref 70–99)
Potassium: 4.1 mmol/L (ref 3.5–5.1)
Sodium: 135 mmol/L (ref 135–145)
Total Bilirubin: 1.6 mg/dL — ABNORMAL HIGH (ref 0.3–1.2)
Total Protein: 6.8 g/dL (ref 6.5–8.1)

## 2018-10-02 LAB — URINALYSIS, ROUTINE W REFLEX MICROSCOPIC
Bilirubin Urine: NEGATIVE
Glucose, UA: NEGATIVE mg/dL
Ketones, ur: 5 mg/dL — AB
Nitrite: NEGATIVE
Protein, ur: 100 mg/dL — AB
RBC / HPF: >50 RBC/hpf — ABNORMAL HIGH (ref 0–5)
Specific Gravity, Urine: 1.019 (ref 1.005–1.030)
WBC, UA: >50 WBC/hpf — ABNORMAL HIGH (ref 0–5)
pH: 5 (ref 5.0–8.0)

## 2018-10-02 LAB — CBC
HCT: 33.2 % — ABNORMAL LOW (ref 39.0–52.0)
Hemoglobin: 10.7 g/dL — ABNORMAL LOW (ref 13.0–17.0)
MCH: 29.2 pg (ref 26.0–34.0)
MCHC: 32.2 g/dL (ref 30.0–36.0)
MCV: 90.5 fL (ref 80.0–100.0)
Platelets: 150 10*3/uL (ref 150–400)
RBC: 3.67 MIL/uL — ABNORMAL LOW (ref 4.22–5.81)
RDW: 11.7 % (ref 11.5–15.5)
WBC: 9.5 10*3/uL (ref 4.0–10.5)
nRBC: 0 % (ref 0.0–0.2)

## 2018-10-02 MED ORDER — SODIUM CHLORIDE 0.9% FLUSH: 3.0000 mL | Freq: Once | INTRAVENOUS | Status: DC

## 2018-10-02 NOTE — ED Triage Notes (Signed)
Urinary frequency with diarrhea since Wednesday  Painful urination  No appetite

## 2018-10-03 LAB — LACTIC ACID, PLASMA: Lactic Acid, Venous: 1 mmol/L (ref 0.5–1.9)

## 2018-10-03 MED ORDER — LIDOCAINE HCL (PF) 1 % IJ SOLN
INTRAMUSCULAR | Status: AC
  Filled 2018-10-03: qty 5

## 2018-10-03 MED ORDER — LIDOCAINE HCL (PF) 1 % IJ SOLN
INTRAMUSCULAR | Status: AC
  Administered 2018-10-03: 5 mL
  Filled 2018-10-03: qty 5

## 2018-10-03 MED ORDER — CEPHALEXIN 500 MG PO CAPS: 500.0000 mg | ORAL_CAPSULE | Freq: Three times a day (TID) | ORAL | 0 refills | Status: DC

## 2018-10-03 MED ORDER — SODIUM CHLORIDE 0.9% FLUSH: 3.0000 mL | Freq: Once | INTRAVENOUS | Status: DC

## 2018-10-03 MED ORDER — ACETAMINOPHEN 325 MG PO TABS
650.0000 mg | ORAL_TABLET | Freq: Once | ORAL | Status: AC
  Administered 2018-10-03: 650 mg via ORAL
  Filled 2018-10-03: qty 2

## 2018-10-03 MED ORDER — CEFTRIAXONE SODIUM 1 G IJ SOLR
1.0000 g | Freq: Once | INTRAMUSCULAR | Status: AC
  Administered 2018-10-03: 1 g via INTRAMUSCULAR
  Filled 2018-10-03: qty 10

## 2018-10-03 NOTE — ED Provider Notes (Signed)
[facility] EMERGENCY DEPARTMENT Provider Note   CSN: [id number] Arrival date & time: 10/02/18  2231     History   Chief Complaint Chief Complaint  Patient presents with  . Chills    HPI Nathan Frye is a 68 y.o. male.     Patient is a 68 year old male with past medical history of hypertension, aortic stenosis with aortic valve replacement.  He presents today for evaluation of fever and chills for the past 2 days.  He does report some dysuria and frequency.  He denies any other symptoms such as cough, chest pain, abdominal pain, diarrhea, sore throat.  He denies any ill contacts.  There are no aggravating or alleviating factors.  The history is provided by the patient.    Past Medical History:  Diagnosis Date  . Chronic anticoagulation   . HTN (hypertension)   . Hypercholesteremia   . Severe aortic stenosis     Patient Active Problem List   Diagnosis Date Noted  . H/O aortic valve replacement 02/01/2015  . HLD (hyperlipidemia) 02/01/2015  . BP (high blood pressure) 02/01/2015  . Encounter for therapeutic drug monitoring 03/11/2013  . S/P AVR (aortic valve replacement) 09/07/2012  . Elevated PSA 02/20/2012  . Avitaminosis D 02/20/2012  . Heart valve replaced by other means 06/12/2010  . Long term current use of anticoagulant therapy 06/12/2010  . Severe aortic stenosis   . Chronic anticoagulation   . HTN (hypertension)   . Hypercholesteremia     Past Surgical History:  Procedure Laterality Date  . AORTIC VALVE REPLACEMENT  2007   #21 MM [facility] REGENT VALVE  . VASECTOMY          Home Medications    Prior to Admission medications   Medication Sig Start Date End Date Taking? Authorizing Provider  Ergocalciferol 2000 UNITS TABS Take 2,000 Units by mouth daily.    [provider]  losartan-hydrochlorothiazide (HYZAAR) 50-12.5 MG per tablet Take 1 tablet by mouth daily.     Martinique, Peter M, MD  metoprolol tartrate  (LOPRESSOR) 25 MG tablet TAKE 1 TABLET BY MOUTH EVERY DAY 09/29/18   Martinique, Peter M, MD  rosuvastatin (CRESTOR) 10 MG tablet TAKE 1 TABLET BY MOUTH EVERY DAY 09/14/18   Martinique, Peter M, MD  warfarin (COUMADIN) 5 MG tablet TAKE 1 TO 1 AND 1/2 TABLETS DAILY AS DIRECTED BY COUMADIN CLINIC 09/07/18   Martinique, Peter M, MD    Family History Family History  Problem Relation Age of Onset  . Hypertension Mother     Social History Social History   Tobacco Use  . Smoking status: Never Smoker  . Smokeless tobacco: Never Used  Substance Use Topics  . Alcohol use: No    Alcohol/week: 0.0 standard drinks  . Drug use: No     Allergies   Patient has no known allergies.   Review of Systems Review of Systems  All other systems reviewed and are negative.    Physical Exam Updated Vital Signs BP (!) 132/56 (BP Location: Right Arm)   Pulse 79   Temp (!) 101.1 F (38.4 C) (Oral)   Resp 18   Ht 5\' 9"  (1.753 m)   Wt 74.8 kg   SpO2 96%   BMI 24.37 kg/m   Physical Exam Vitals signs and nursing note reviewed.  Constitutional:      General: He is not in acute distress.    Appearance: He is well-developed. He is not diaphoretic.  HENT:  Head: Normocephalic and atraumatic.     Mouth/Throat:     Mouth: Mucous membranes are moist.     Pharynx: No oropharyngeal exudate or posterior oropharyngeal erythema.  Neck:     Musculoskeletal: Normal range of motion and neck supple.  Cardiovascular:     Rate and Rhythm: Normal rate and regular rhythm.     Heart sounds: No murmur. No friction rub.  Pulmonary:     Effort: Pulmonary effort is normal. No respiratory distress.     Breath sounds: Normal breath sounds. No wheezing or rales.  Abdominal:     General: Bowel sounds are normal. There is no distension.     Palpations: Abdomen is soft.     Tenderness: There is no abdominal tenderness.  Musculoskeletal: Normal range of motion.  Skin:    General: Skin is warm and dry.  Neurological:      Mental Status: He is alert and oriented to person, place, and time.     Coordination: Coordination normal.      ED Treatments / Results  Labs (all labs ordered are listed, but only abnormal results are displayed) Labs Reviewed  COMPREHENSIVE METABOLIC PANEL - Abnormal; Notable for the following components:      Result Value   Glucose, Bld 127 (*)    BUN 34 (*)    Creatinine, Ser 2.29 (*)    Total Bilirubin 1.6 (*)    GFR calc non Af Amer 28 (*)    GFR calc Af Amer 33 (*)    All other components within normal limits  CBC - Abnormal; Notable for the following components:   RBC 3.67 (*)    Hemoglobin 10.7 (*)    HCT 33.2 (*)    All other components within normal limits  URINALYSIS, ROUTINE W REFLEX MICROSCOPIC - Abnormal; Notable for the following components:   Color, Urine AMBER (*)    APPearance CLOUDY (*)    Hgb urine dipstick LARGE (*)    Ketones, ur 5 (*)    Protein, ur 100 (*)    Leukocytes,Ua LARGE (*)    RBC / HPF >50 (*)    WBC, UA >50 (*)    Bacteria, UA FEW (*)    All other components within normal limits  NOVEL CORONAVIRUS, NAA (HOSPITAL ORDER, SEND-OUT TO REF LAB)  LACTIC ACID, PLASMA  LACTIC ACID, PLASMA    EKG None  Radiology No results found.  Procedures Procedures (including critical care time)  Medications Ordered in ED Medications  sodium chloride flush (NS) 0.9 % injection 3 mL (has no administration in time range)  sodium chloride flush (NS) 0.9 % injection 3 mL (has no administration in time range)  cefTRIAXone (ROCEPHIN) injection 1 g (has no administration in time range)  acetaminophen (TYLENOL) tablet 650 mg (650 mg Oral Given 10/03/18 0507)     Initial Impression / Assessment and Plan / ED Course  I have reviewed the triage vital signs and the nursing notes.  Pertinent labs & imaging results that were available during my care of the patient were reviewed by me and considered in my medical decision making (see chart for details).   Patient presenting today with chills, fever, and dysuria.  He has low-grade fever to 101 in the ER.  His laboratory studies are reassuring, but urinalysis does show a UTI.  I suspect this to be the etiology of the fever and the cause of his symptoms.  Patient was also concerned about the possibility of COVID-19.  A  send out test was obtained.  Patient appears appropriate for discharge.  His vitals are stable and does not appear septic.  Final Clinical Impressions(s) / ED Diagnoses   Final diagnoses:  None    ED Discharge Orders    None       Veryl Speak, MD 10/03/18 [phone]

## 2018-10-03 NOTE — ED Triage Notes (Signed)
Pt brought back to triage for reassessment after vitals recheck. Pt says that he has had a fever (around 102) all day, urinary frequency, burning, and urgency.  Small amounts diarrhea, no vomiting. No abdominal pain, back pain, or blood in his urine.

## 2018-10-03 NOTE — Discharge Instructions (Addendum)
Begin taking Keflex as prescribed.  Drink plenty of fluids and get plenty of rest.  Return to the emergency department if you develop worsening fever, weakness, shortness of breath, dizziness, or other new and concerning symptoms.

## 2018-10-04 LAB — NOVEL CORONAVIRUS, NAA (HOSP ORDER, SEND-OUT TO REF LAB; TAT 18-24 HRS): SARS-CoV-2, NAA: NOT DETECTED

## 2018-10-08 ENCOUNTER — Other Ambulatory Visit: Payer: Self-pay

## 2018-10-08 ENCOUNTER — Ambulatory Visit (INDEPENDENT_AMBULATORY_CARE_PROVIDER_SITE_OTHER): Payer: BC Managed Care – PPO | Admitting: Pharmacist

## 2018-10-08 DIAGNOSIS — Z5181 Encounter for therapeutic drug level monitoring: Secondary | ICD-10-CM | POA: Diagnosis not present

## 2018-10-08 LAB — POCT INR: INR: 4.9 — AB (ref 2.0–3.0)

## 2018-10-08 NOTE — Patient Instructions (Signed)
Description   Skip your Coumadin tomorrow and take only 1/2 tablet on Saturday, then continue taking 1 tablet daily except 1.5 tablets on Tuesdays, Thursdays, and Saturdays.  Repeat INR in 3 weeks. Call us with any medication changes or concerns # [phone] [facility], Main # [phone].

## 2018-10-29 ENCOUNTER — Ambulatory Visit (INDEPENDENT_AMBULATORY_CARE_PROVIDER_SITE_OTHER): Payer: BC Managed Care – PPO | Admitting: Pharmacist

## 2018-10-29 ENCOUNTER — Other Ambulatory Visit: Payer: Self-pay

## 2018-10-29 DIAGNOSIS — Z5181 Encounter for therapeutic drug level monitoring: Secondary | ICD-10-CM

## 2018-10-29 LAB — POCT INR: INR: 2.9 (ref 2.0–3.0)

## 2018-10-29 NOTE — Patient Instructions (Signed)
Description   Continue taking 1 tablet daily except 1.5 tablets on Tuesdays, Thursdays, and Saturdays.  Repeat INR in 4 weeks. Call us with any medication changes or concerns # [phone] [facility] Main # [phone].      

## 2018-11-12 NOTE — Progress Notes (Signed)
Virtual Visit via Telephone Note   This visit type was conducted due to national recommendations for restrictions regarding the COVID-19 Pandemic (e.g. social distancing) in an effort to limit this patient's exposure and mitigate transmission in our community.  Due to his co-morbid illnesses, this patient is at least at moderate risk for complications without adequate follow up.  This format is felt to be most appropriate for this patient at this time.  The patient did not have access to video technology/had technical difficulties with video requiring transitioning to audio format only (telephone).  All issues noted in this document were discussed and addressed.  No physical exam could be performed with this format.  Please refer to the patient's chart for his  consent to telehealth for [facility].   Date:  11/13/2018   ID:  Nathan Frye, DOB 24-Mar-1950, MRN [id number]  Patient Location: Home Provider Location: Home  PCP:  Nathan Courser, MD  Cardiologist:  Nathan Batley Martinique MD Electrophysiologist:  None   Evaluation Performed:  Follow-Up Visit  Chief Complaint:  AVR  History of Present Illness:    Nathan Frye is a 68 y.o. male with a history of aortic valve disease. 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 Last Echo in July 2017 showed normal valve function.   He notes that since May his BP has been running high. Typically in the [id number] systolic. Also high on last visit with primary care. Tolerating medication well. He is walking 4-5 miles/day. No chest pain, papitations, dizziness, HA, or SOB.  The patient does not have symptoms concerning for COVID-19 infection (fever, chills, cough, or new shortness of breath).    Past Medical History:  Diagnosis Date  . Chronic anticoagulation   . HTN (hypertension)   . Hypercholesteremia   . Severe aortic stenosis    Past Surgical History:  Procedure Laterality Date  . AORTIC VALVE REPLACEMENT   2007   #21 MM [facility] REGENT VALVE  . VASECTOMY       Current Meds  Medication Sig  . Ergocalciferol 2000 UNITS TABS Take 2,000 Units by mouth daily.  . metoprolol tartrate (LOPRESSOR) 25 MG tablet TAKE 1 TABLET BY MOUTH EVERY DAY  . rosuvastatin (CRESTOR) 10 MG tablet TAKE 1 TABLET BY MOUTH EVERY DAY  . warfarin (COUMADIN) 5 MG tablet TAKE 1 TO 1 AND 1/2 TABLETS DAILY AS DIRECTED BY COUMADIN CLINIC  . [DISCONTINUED] losartan-hydrochlorothiazide (HYZAAR) 50-12.5 MG per tablet Take 1 tablet by mouth daily.      Allergies:   Patient has no known allergies.   Social History   Tobacco Use  . Smoking status: Never Smoker  . Smokeless tobacco: Never Used  Substance Use Topics  . Alcohol use: No    Alcohol/week: 0.0 standard drinks  . Drug use: No     Family Hx: The patient's family history includes Hypertension in his mother.  ROS:   Please see the history of present illness.    All other systems reviewed and are negative.   Prior CV studies:   The following studies were reviewed today:  Echo: 09/11/15: Study Conclusions  - Left ventricle: The cavity size was normal. Wall thickness was normal. Systolic function was normal. The estimated ejection fraction was in the range of 55% to 60%. Wall motion was normal; there were no regional wall motion abnormalities. Left ventricular diastolic function parameters were normal. - Aortic valve: A mechanical prosthesis was present and functioning normally. There  was trivial regurgitation. Valve area (VTI): 1.58 cm^2. Valve area (Vmax): 1.62 cm^2. Valve area (Vmean): 1.72 cm^2. - Mitral valve: There was mild regurgitation.   Labs/Other Tests and Data Reviewed:    EKG:  No ECG reviewed.  Recent Labs: 10/02/2018: ALT 24; BUN 34; Creatinine, Ser 2.29; Hemoglobin 10.7; Platelets 150; Potassium 4.1; Sodium 135   Recent Lipid Panel Lab Results  Component Value Date/Time   CHOL 163 05/02/2011 11:37 AM   TRIG 75.0  05/02/2011 11:37 AM   HDL 67.00 05/02/2011 11:37 AM   CHOLHDL 2 05/02/2011 11:37 AM   LDLCALC 81 05/02/2011 11:37 AM   Dated 07/02/18: cholesterol 180, triglycerides 86, HDL 89, LDL 74.   Wt Readings from Last 3 Encounters:  11/13/18 160 lb (72.6 kg)  10/02/18 165 lb (74.8 kg)  10/28/17 163 lb 6.4 oz (74.1 kg)     Objective:    Vital Signs:  BP (!) 155/70   Pulse 65   Ht 5' 9.5" (1.765 m)   Wt 160 lb (72.6 kg)   BMI 23.29 kg/m    VITAL SIGNS:  reviewed  ASSESSMENT & PLAN:     1. Status post mechanical aortic valve replacement. Satisfactory Echo 2017.  Clinically stable. Continue chronic anticoagulation.  Routine SBE prophylaxis. I will followup again in one year  2. Hypertension- BP has increased. Will increase Hyzaar to 100/25 mg daily. Continue metoprolol. He will monitor at home.  3. Hypercholesterolemia-on Crestor. Excellent control.   COVID-19 Education: The signs and symptoms of COVID-19 were discussed with the patient and how to seek care for testing (follow up with PCP or arrange E-visit).  The importance of social distancing was discussed today.  Time:   Today, I have spent 15 minutes with the patient with telehealth technology discussing the above problems.     Medication Adjustments/Labs and Tests Ordered: Current medicines are reviewed at length with the patient today.  Concerns regarding medicines are outlined above.   Tests Ordered: No orders of the defined types were placed in this encounter.   Medication Changes: Meds ordered this encounter  Medications  . losartan-hydrochlorothiazide (HYZAAR) 100-25 MG tablet    Sig: Take 1 tablet by mouth daily.    Dispense:  90 tablet    Refill:  3    Follow Up:  In Person in 1 year(s)  Signed, Nathan Edrington Martinique, MD  11/13/2018 3:28 PM    [place]

## 2018-11-13 ENCOUNTER — Encounter: Payer: Self-pay | Admitting: Cardiology

## 2018-11-13 ENCOUNTER — Telehealth (INDEPENDENT_AMBULATORY_CARE_PROVIDER_SITE_OTHER): Payer: BC Managed Care – PPO | Admitting: Cardiology

## 2018-11-13 VITALS — BP 155/70 | HR 65 | Ht 69.5 in | Wt 160.0 lb

## 2018-11-13 DIAGNOSIS — Z952 Presence of prosthetic heart valve: Secondary | ICD-10-CM

## 2018-11-13 DIAGNOSIS — I10 Essential (primary) hypertension: Secondary | ICD-10-CM

## 2018-11-13 DIAGNOSIS — E7800 Pure hypercholesterolemia, unspecified: Secondary | ICD-10-CM

## 2018-11-13 MED ORDER — LOSARTAN POTASSIUM-HCTZ 100-25 MG PO TABS: 1.0000 | ORAL_TABLET | Freq: Every day | ORAL | 3 refills | Status: DC

## 2018-11-13 NOTE — Patient Instructions (Addendum)
Increase Hyzaar to 100/25 mg daily for blood pressure   Schedule follow up appointment in 1 year   Call in July to schedule Oct appointment

## 2018-11-26 ENCOUNTER — Other Ambulatory Visit: Payer: Self-pay

## 2018-11-26 ENCOUNTER — Ambulatory Visit (INDEPENDENT_AMBULATORY_CARE_PROVIDER_SITE_OTHER): Payer: BC Managed Care – PPO | Admitting: *Deleted

## 2018-11-26 DIAGNOSIS — Z5181 Encounter for therapeutic drug level monitoring: Secondary | ICD-10-CM

## 2018-11-26 LAB — POCT INR: INR: 3.1 — AB (ref 2.0–3.0)

## 2018-11-26 NOTE — Patient Instructions (Signed)
Description   Continue taking 1 tablet daily except 1.5 tablets on Tuesdays, Thursdays, and Saturdays.  Repeat INR in 5 weeks. Call us with any medication changes or concerns # [phone] [facility], Main # [phone].     

## 2018-12-06 ENCOUNTER — Other Ambulatory Visit: Payer: Self-pay | Admitting: Cardiology

## 2018-12-07 NOTE — Telephone Encounter (Signed)
Refill request

## 2018-12-31 ENCOUNTER — Ambulatory Visit (INDEPENDENT_AMBULATORY_CARE_PROVIDER_SITE_OTHER): Payer: BC Managed Care – PPO | Admitting: *Deleted

## 2018-12-31 ENCOUNTER — Other Ambulatory Visit: Payer: Self-pay

## 2018-12-31 DIAGNOSIS — Z5181 Encounter for therapeutic drug level monitoring: Secondary | ICD-10-CM

## 2018-12-31 LAB — POCT INR: INR: 2.6 (ref 2.0–3.0)

## 2018-12-31 NOTE — Patient Instructions (Addendum)
Description   Today take 2 tablets then continue taking 1 tablet daily except 1.5 tablets on Tuesdays, Thursdays, and Saturdays.  Repeat INR in 6 weeks. Call us with any medication changes or concerns # [phone] [facility], Main # [phone].

## 2019-01-10 ENCOUNTER — Other Ambulatory Visit: Payer: Self-pay | Admitting: Cardiology

## 2019-02-11 ENCOUNTER — Ambulatory Visit (INDEPENDENT_AMBULATORY_CARE_PROVIDER_SITE_OTHER): Payer: BC Managed Care – PPO

## 2019-02-11 ENCOUNTER — Other Ambulatory Visit: Payer: Self-pay

## 2019-02-11 DIAGNOSIS — Z5181 Encounter for therapeutic drug level monitoring: Secondary | ICD-10-CM

## 2019-02-11 LAB — POCT INR: INR: 4.4 — AB (ref 2.0–3.0)

## 2019-02-11 NOTE — Patient Instructions (Signed)
Description   Skip tomorrow's dosage of Coumadin, then resume same dosage 1 tablet daily except 1.5 tablets on Tuesdays, Thursdays, and Saturdays.  Repeat INR in 3 weeks. Call us with any medication changes or concerns # [phone] [facility], Main # [phone].

## 2019-03-04 ENCOUNTER — Ambulatory Visit (INDEPENDENT_AMBULATORY_CARE_PROVIDER_SITE_OTHER): Payer: BC Managed Care – PPO | Admitting: *Deleted

## 2019-03-04 ENCOUNTER — Other Ambulatory Visit: Payer: Self-pay

## 2019-03-04 DIAGNOSIS — Z5181 Encounter for therapeutic drug level monitoring: Secondary | ICD-10-CM

## 2019-03-04 LAB — POCT INR: INR: 2.3 (ref 2.0–3.0)

## 2019-03-04 NOTE — Patient Instructions (Signed)
Description   Take 2 tablets today, then resume same dosage 1 tablet daily except 1.5 tablets on Tuesdays, Thursdays, and Saturdays.  Repeat INR in 3 weeks. Call us with any medication changes or concerns # [phone] [facility], Main # [phone].

## 2019-03-19 ENCOUNTER — Other Ambulatory Visit: Payer: Self-pay

## 2019-03-19 MED ORDER — ROSUVASTATIN CALCIUM 10 MG PO TABS: 10.0000 mg | ORAL_TABLET | Freq: Every day | ORAL | 1 refills | Status: DC

## 2019-03-29 ENCOUNTER — Ambulatory Visit (INDEPENDENT_AMBULATORY_CARE_PROVIDER_SITE_OTHER): Payer: BC Managed Care – PPO | Admitting: *Deleted

## 2019-03-29 ENCOUNTER — Other Ambulatory Visit: Payer: Self-pay

## 2019-03-29 DIAGNOSIS — Z5181 Encounter for therapeutic drug level monitoring: Secondary | ICD-10-CM | POA: Diagnosis not present

## 2019-03-29 LAB — POCT INR: INR: 3.5 — AB (ref 2.0–3.0)

## 2019-03-29 NOTE — Patient Instructions (Signed)
Description   Continue taking 1 tablet daily except 1.5 tablets on Tuesdays, Thursdays, and Saturdays.  Repeat INR in 4 weeks. Call us with any medication changes or concerns # [phone] [facility], Main # [phone].

## 2019-04-26 ENCOUNTER — Other Ambulatory Visit: Payer: Self-pay

## 2019-04-26 ENCOUNTER — Ambulatory Visit (INDEPENDENT_AMBULATORY_CARE_PROVIDER_SITE_OTHER): Payer: BC Managed Care – PPO | Admitting: *Deleted

## 2019-04-26 DIAGNOSIS — Z5181 Encounter for therapeutic drug level monitoring: Secondary | ICD-10-CM

## 2019-04-26 LAB — POCT INR: INR: 3.6 — AB (ref 2.0–3.0)

## 2019-04-26 NOTE — Patient Instructions (Signed)
Description   Take 1/2 a tablet today and then continue taking 1 tablet daily except 1.5 tablets on Tuesdays, Thursdays, and Saturdays.  Repeat INR in 4 weeks. Call us with any medication changes or concerns # [phone] [facility], Main # [phone].

## 2019-05-24 ENCOUNTER — Ambulatory Visit (INDEPENDENT_AMBULATORY_CARE_PROVIDER_SITE_OTHER): Payer: BC Managed Care – PPO | Admitting: *Deleted

## 2019-05-24 ENCOUNTER — Other Ambulatory Visit: Payer: Self-pay

## 2019-05-24 DIAGNOSIS — Z5181 Encounter for therapeutic drug level monitoring: Secondary | ICD-10-CM | POA: Diagnosis not present

## 2019-05-24 LAB — POCT INR: INR: 3.3 — AB (ref 2.0–3.0)

## 2019-05-24 NOTE — Patient Instructions (Signed)
Description   Continue taking 1 tablet daily except 1.5 tablets on Tuesdays, Thursdays, and Saturdays.  Repeat INR in 5 weeks. Call us with any medication changes or concerns # [phone] [facility], Main # [phone].

## 2019-06-28 ENCOUNTER — Other Ambulatory Visit: Payer: Self-pay

## 2019-06-28 ENCOUNTER — Ambulatory Visit (INDEPENDENT_AMBULATORY_CARE_PROVIDER_SITE_OTHER): Payer: BC Managed Care – PPO | Admitting: *Deleted

## 2019-06-28 DIAGNOSIS — Z5181 Encounter for therapeutic drug level monitoring: Secondary | ICD-10-CM | POA: Diagnosis not present

## 2019-06-28 LAB — POCT INR: INR: 3.5 — AB (ref 2.0–3.0)

## 2019-06-28 NOTE — Patient Instructions (Signed)
Description   Continue taking 1 tablet daily except 1.5 tablets on Tuesdays, Thursdays, and Saturdays.  Repeat INR in 6 weeks. Call us with any medication changes or concerns # [phone] [facility], Main # [phone].

## 2019-07-03 ENCOUNTER — Other Ambulatory Visit: Payer: Self-pay | Admitting: Cardiology

## 2019-08-09 ENCOUNTER — Ambulatory Visit (INDEPENDENT_AMBULATORY_CARE_PROVIDER_SITE_OTHER): Payer: BC Managed Care – PPO | Admitting: *Deleted

## 2019-08-09 ENCOUNTER — Other Ambulatory Visit: Payer: Self-pay

## 2019-08-09 DIAGNOSIS — Z5181 Encounter for therapeutic drug level monitoring: Secondary | ICD-10-CM

## 2019-08-09 LAB — POCT INR: INR: 3.5 — AB (ref 2.0–3.0)

## 2019-08-09 NOTE — Patient Instructions (Addendum)
Description   Continue taking Warfarin 1 tablet daily except 1.5 tablets on Tuesdays, Thursdays, and Saturdays. Repeat INR in 6 weeks. Call us with any medication changes or concerns # [phone] [facility], Main # [phone], Fax #[phone] or [phone]     

## 2019-08-10 ENCOUNTER — Other Ambulatory Visit: Payer: Self-pay

## 2019-08-10 DIAGNOSIS — R972 Elevated prostate specific antigen [PSA]: Secondary | ICD-10-CM

## 2019-08-10 NOTE — Progress Notes (Signed)
Letter sent via my chart and mailed per Dr. Alyson Ingles referral from [facility] to schedule prostate biopsy. I spoke with patient over the phone and confirmed all appts and instructions reviewed as well. Pt voiced understanding.

## 2019-08-10 NOTE — Progress Notes (Signed)
Surgical clearance sent via fax to Dr. Peter Martinique for pt to hold coumadin

## 2019-08-23 ENCOUNTER — Telehealth: Payer: Self-pay

## 2019-08-23 NOTE — Telephone Encounter (Signed)
   [facility] Pre-operative Risk Assessment    Request for surgical clearance:  1. What type of surgery is being performed? PROSTATE BX   2. When is this surgery scheduled? TBD   3. What type of clearance is required (medical clearance vs. Pharmacy clearance to hold med vs. Both)? BOTH  4. Are there any medications that need to be held prior to surgery and how long?COUMADIN 5 DAYS BEFORE   5. Practice name and name of physician performing surgery? [facility]   6. What is the office phone number? [phone]   7.   What is the office fax number? [phone]  8.   Anesthesia type (None, local, MAC, general) ? LEFT MESSAGE FOR ANESTHESIA   Waylan Rocher 08/23/2019, 4:38 PM

## 2019-08-24 NOTE — Telephone Encounter (Signed)
   Primary Cardiologist: Peter Martinique, MD  Chart reviewed as part of pre-operative protocol coverage. Patient was contacted 08/24/2019 in reference to pre-operative risk assessment for pending surgery as outlined below.  MALEK SKOG was last seen on 11/13/2018 by Dr. Martinique via a telemedicine visit.  Since that day, ASON HESLIN has done well from a cardiac standpoint. He exercises regularly M-F walking on the treadmill or using the elliptical for up to an hour at a time. No complaints of chest pain or SOB. He can easily complete 4 METs without anginal complaints.  Therefore, based on ACC/AHA guidelines, the patient would be at acceptable risk for the planned procedure without further cardiovascular testing.   Per pharmacy recommendations, patient can hold coumadin 5 days prior to his upcoming procedure with plans to restart as soon as he is cleared to do so by his urologist.   I will route this recommendation to the requesting party via [facility] fax function and remove from pre-op pool. Please call with questions.  Abigail Butts, PA-C 08/24/2019, 9:21 AM

## 2019-08-24 NOTE — Telephone Encounter (Signed)
Patient with diagnosis of mechanical aortic valve on warfarin for anticoagulation.    Procedure: PROSTATE BX  Date of procedure: TBD  Per office protocol, patient can hold warfarin for 5 days prior to procedure .   Patient will NOT need bridging with Lovenox (enoxaparin) around procedure.

## 2019-08-25 ENCOUNTER — Telehealth: Payer: Self-pay

## 2019-08-25 NOTE — Telephone Encounter (Signed)
Estill Bamberg, Dr. Noland Fordyce RN with [facility] in [place] called to f/u on the clearance for a prostate biopsy that is scheduled for 7/26. Eunice Blase that the recommendation was routed back to the requesting party via [place] fax on 7/13. Estill Bamberg stated that they have not received the recommendation to the fax number provided. Please advise.

## 2019-08-25 NOTE — Telephone Encounter (Signed)
[facility] and spoke with Olivia Mackie.  They are a part of [facility] and have Epic.  Showed her how to pull pt's surgical clearance up and made sure that's what she needed.  She was grateful for the help.

## 2019-08-25 NOTE — Telephone Encounter (Signed)
I called pt this am to talk with patient about cardiology clearance received and pt can hold warfarin 5 days prior to prostate biopsy. Pt voiced understanding.

## 2019-09-06 ENCOUNTER — Other Ambulatory Visit: Payer: Self-pay

## 2019-09-06 ENCOUNTER — Ambulatory Visit (HOSPITAL_BASED_OUTPATIENT_CLINIC_OR_DEPARTMENT_OTHER): Payer: BC Managed Care – PPO | Admitting: Urology

## 2019-09-06 ENCOUNTER — Other Ambulatory Visit: Payer: Self-pay | Admitting: Urology

## 2019-09-06 ENCOUNTER — Encounter: Payer: Self-pay | Admitting: Urology

## 2019-09-06 ENCOUNTER — Encounter (HOSPITAL_COMMUNITY): Payer: Self-pay

## 2019-09-06 ENCOUNTER — Ambulatory Visit (HOSPITAL_COMMUNITY)
Admission: RE | Admit: 2019-09-06 | Discharge: 2019-09-06 | Disposition: A | Discharge status: Home / Self Care | Payer: BC Managed Care – PPO | Source: Ambulatory Visit | Attending: Urology | Admitting: Urology

## 2019-09-06 DIAGNOSIS — N411 Chronic prostatitis: Secondary | ICD-10-CM | POA: Diagnosis not present

## 2019-09-06 DIAGNOSIS — D291 Benign neoplasm of prostate: Secondary | ICD-10-CM | POA: Diagnosis not present

## 2019-09-06 DIAGNOSIS — R972 Elevated prostate specific antigen [PSA]: Secondary | ICD-10-CM | POA: Diagnosis present

## 2019-09-06 MED ORDER — GENTAMICIN SULFATE 40 MG/ML IJ SOLN
INTRAMUSCULAR | Status: AC
  Administered 2019-09-06: 80 mg via INTRAMUSCULAR
  Filled 2019-09-06: qty 2

## 2019-09-06 MED ORDER — LIDOCAINE HCL (PF) 2 % IJ SOLN
INTRAMUSCULAR | Status: AC
  Administered 2019-09-06: 10 mL
  Filled 2019-09-06: qty 10

## 2019-09-06 MED ORDER — GENTAMICIN SULFATE 40 MG/ML IJ SOLN: 80.0000 mg | Freq: Once | INTRAMUSCULAR | Status: AC

## 2019-09-06 NOTE — Progress Notes (Signed)
Prostate Biopsy Procedure   Informed consent was obtained after discussing risks/benefits of the procedure.  A time out was performed to ensure correct patient identity.  Pre-Procedure: - Last PSA Level: No results found for: PSA - Gentamicin given prophylactically - Levaquin 500 mg administered PO -Transrectal Ultrasound performed revealing a 87.8gm prostate -No significant hypoechoic or median lobe noted  Procedure: - Prostate block performed using 10 cc 1% lidocaine and biopsies taken from sextant areas, a total of 12 under ultrasound guidance.  Post-Procedure: - Patient tolerated the procedure well - He was counseled to seek immediate medical attention if experiences any severe pain, significant bleeding, or fevers - Return in one week to discuss biopsy results

## 2019-09-06 NOTE — Patient Instructions (Signed)

## 2019-09-08 ENCOUNTER — Telehealth: Payer: Self-pay

## 2019-09-08 NOTE — Telephone Encounter (Signed)
-----   Message from Cleon Gustin, MD sent at 09/08/2019 12:07 PM EDT ----- Negative. Please have him followup 6 months with PSA ----- Message ----- From: Dorisann Frames, RN Sent: 09/08/2019   8:14 AM EDT To: Cleon Gustin, MD  Path report for Prostate Biopsy

## 2019-09-08 NOTE — Telephone Encounter (Signed)
Pt called and made aware. appt made for OV and lab. Both mailed to pt.

## 2019-09-12 ENCOUNTER — Other Ambulatory Visit: Payer: Self-pay | Admitting: Cardiology

## 2019-09-15 ENCOUNTER — Ambulatory Visit: Payer: BC Managed Care – PPO | Admitting: Urology

## 2019-09-20 ENCOUNTER — Other Ambulatory Visit: Payer: Self-pay

## 2019-09-20 ENCOUNTER — Ambulatory Visit (INDEPENDENT_AMBULATORY_CARE_PROVIDER_SITE_OTHER): Payer: BC Managed Care – PPO | Admitting: *Deleted

## 2019-09-20 DIAGNOSIS — Z5181 Encounter for therapeutic drug level monitoring: Secondary | ICD-10-CM

## 2019-09-20 LAB — POCT INR: INR: 2.7 (ref 2.0–3.0)

## 2019-09-20 NOTE — Patient Instructions (Signed)
Description   Continue taking Warfarin 1 tablet daily except 1.5 tablets on Tuesdays, Thursdays, and Saturdays. Repeat INR in 6 weeks. Call us with any medication changes or concerns # [phone] [facility], Main # [phone], Fax #[phone] or [phone]     

## 2019-11-02 ENCOUNTER — Ambulatory Visit (INDEPENDENT_AMBULATORY_CARE_PROVIDER_SITE_OTHER): Payer: BC Managed Care – PPO | Admitting: Pharmacist

## 2019-11-02 ENCOUNTER — Other Ambulatory Visit: Payer: Self-pay

## 2019-11-02 DIAGNOSIS — Z952 Presence of prosthetic heart valve: Secondary | ICD-10-CM

## 2019-11-02 DIAGNOSIS — Z5181 Encounter for therapeutic drug level monitoring: Secondary | ICD-10-CM

## 2019-11-02 LAB — POCT INR: INR: 3.3 — AB (ref 2.0–3.0)

## 2019-11-02 NOTE — Patient Instructions (Signed)
Continue taking Warfarin 1 tablet daily except 1.5 tablets on Tuesdays, Thursdays, and Saturdays.  Repeat INR in 7 weeks. Call us with any medication changes or concerns # [phone] [facility], Main # [phone], Fax [phone] or [phone]

## 2019-11-18 NOTE — Progress Notes (Signed)
Nathan Frye Date of Birth: 1950-08-15   History of Present Illness: Nathan Frye is seen today for followup of his aortic valve disease. 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 Last Echo in July 2017 showed normal valve function.   On follow up today he is doing very well. He denies any  symptoms of chest pain, shortness of breath, or palpitations. He continues to exercise regularly 3-5 days a week. 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 Ergocalciferol 2000 UNITS TABS Take 2,000 Units by mouth daily.    Marland Kitchen losartan-hydrochlorothiazide (HYZAAR) 100-25 MG tablet Take 1 tablet by mouth daily. 90 tablet 3  . metoprolol tartrate (LOPRESSOR) 25 MG tablet TAKE 1 TABLET BY MOUTH EVERY DAY 90 tablet 3  . rosuvastatin (CRESTOR) 10 MG tablet TAKE 1 TABLET BY MOUTH EVERY DAY 90 tablet 1  . warfarin (COUMADIN) 5 MG tablet TAKE 1 TO 1 AND 1/2 TABLETS DAILY AS DIRECTED BY COUMADIN CLINIC 135 tablet 1   No current facility-administered medications on file prior to visit.    No Known Allergies  Past Medical History:  Diagnosis Date  . Chronic anticoagulation   . HTN (hypertension)   . Hypercholesteremia   . Severe aortic stenosis     Past Surgical History:  Procedure Laterality Date  . AORTIC VALVE REPLACEMENT  2007   #21 MM [facility] VALVE  . VASECTOMY      Social History   Tobacco Use  Smoking Status Never Smoker  Smokeless Tobacco Never Used    Social History   Substance and Sexual Activity  Alcohol Use No  . Alcohol/week: 0.0 standard drinks    Family History  Problem Relation Age of Onset  . Hypertension Mother     Review of Systems: As noted history of present illness.  All other systems were reviewed and are negative.  Physical Exam: BP 130/70 (BP Location: Left Arm, Patient Position: Sitting, Cuff  Size: Normal)   Pulse 60   Ht 5\' 8"  (1.727 m)   Wt 165 lb (74.8 kg)   BMI 25.09 kg/m  GENERAL:  Well appearing BM in NAD HEENT:  PERRL, EOMI, sclera are clear. Oropharynx is clear. NECK:  No jugular venous distention, carotid upstroke brisk and symmetric, no bruits, no thyromegaly or adenopathy LUNGS:  Clear to auscultation bilaterally CHEST:  Unremarkable HEART:  RRR,  PMI not displaced or sustained,S1 and S2 within normal limits, no S3, no S4: good mechanical AV click. Soft 0-9/9 systolic murmur at the apex. ABD:  Soft, nontender. BS +, no masses or bruits. No hepatomegaly, no splenomegaly EXT:  2 + pulses throughout, no edema, no cyanosis no clubbing SKIN:  Warm and dry.  No rashes NEURO:  Alert and oriented x 3. Cranial nerves II through XII intact. PSYCH:  Cognitively intact    LABORATORY DATA: Lab Results  Component Value Date   WBC 9.5 10/02/2018   HGB 10.7 (L) 10/02/2018   HCT 33.2 (L) 10/02/2018   PLT 150 10/02/2018   GLUCOSE 127 (H) 10/02/2018   CHOL 163 05/02/2011   TRIG 75.0 05/02/2011   HDL 67.00 05/02/2011   LDLCALC 81 05/02/2011   ALT 24 10/02/2018   AST 38 10/02/2018   NA 135 10/02/2018   K 4.1 10/02/2018   CL 100 10/02/2018  CREATININE 2.29 (H) 10/02/2018   BUN 34 (H) 10/02/2018   CO2 25 10/02/2018   INR 3.3 (A) 11/02/2019     LABS reviewed from 04/27/15. Hgb 13.1. CMET normal, Cholesterol 167, Triglycerides 80, HDL 80, LDL 71. TSH normal. From 06/19/16: cholesterol 165, triglycerides 53. HDL 77, LDL 77. Hgb 13.2. CMET and TSH normal. Dated 09/16/17: Hgb 12.7. CMET normal. Cholesterol 175, triglycerides 64, HDL 79, LDL 83. TSH normal.  Dated 01/06/19: creatinine 1.25. otherwise CMET normal Dated 07/29/19: cholesterol 172, triglycerides 56, HDL 68, LDL 93. Hgb 12.2.   Ecg today shows NSR rate 60. Occ. PVC. LVH. I have personally reviewed and interpreted this study.   Echo: 09/11/15: Study Conclusions  - Left ventricle: The cavity size was normal.  Wall thickness was   normal. Systolic function was normal. The estimated ejection   fraction was in the range of 55% to 60%. Wall motion was normal;   there were no regional wall motion abnormalities. Left   ventricular diastolic function parameters were normal. - Aortic valve: A mechanical prosthesis was present and functioning   normally. There was trivial regurgitation. Valve area (VTI): 1.58   cm^2. Valve area (Vmax): 1.62 cm^2. Valve area (Vmean): 1.72   cm^2. - Mitral valve: There was mild regurgitation.    Assessment / Plan:  1. Status post mechanical aortic valve replacement 2007. Satisfactory Echo 2017.  Clinically stable. Continue chronic anticoagulation.  Routine SBE prophylaxis. I will followup again in one year  2. Hypertension, well controlled. Continue Hyzaar and metoprolol.   3. Hypercholesterolemia-on Crestor. good control.

## 2019-11-25 ENCOUNTER — Ambulatory Visit: Payer: BC Managed Care – PPO | Admitting: Cardiology

## 2019-11-25 ENCOUNTER — Encounter: Payer: Self-pay | Admitting: Cardiology

## 2019-11-25 ENCOUNTER — Other Ambulatory Visit: Payer: Self-pay

## 2019-11-25 VITALS — BP 130/70 | HR 60 | Ht 68.0 in | Wt 165.0 lb

## 2019-11-25 DIAGNOSIS — E7800 Pure hypercholesterolemia, unspecified: Secondary | ICD-10-CM

## 2019-11-25 DIAGNOSIS — I10 Essential (primary) hypertension: Secondary | ICD-10-CM | POA: Diagnosis not present

## 2019-11-25 DIAGNOSIS — Z952 Presence of prosthetic heart valve: Secondary | ICD-10-CM

## 2019-11-26 ENCOUNTER — Ambulatory Visit: Payer: BC Managed Care – PPO | Admitting: Cardiology

## 2019-12-21 ENCOUNTER — Other Ambulatory Visit: Payer: Self-pay

## 2019-12-21 ENCOUNTER — Ambulatory Visit (INDEPENDENT_AMBULATORY_CARE_PROVIDER_SITE_OTHER): Payer: BC Managed Care – PPO

## 2019-12-21 DIAGNOSIS — Z5181 Encounter for therapeutic drug level monitoring: Secondary | ICD-10-CM

## 2019-12-21 LAB — POCT INR: INR: 2.7 (ref 2.0–3.0)

## 2019-12-21 NOTE — Patient Instructions (Signed)
Description   Continue on same dosage of Warfarin 1 tablet daily except 1.5 tablets on Tuesdays, Thursdays, and Saturdays.  Repeat INR in 8 weeks. Call us with any medication changes or concerns # [phone] [facility], Main # [phone], Fax [phone] or [phone]

## 2019-12-28 ENCOUNTER — Other Ambulatory Visit: Payer: Self-pay | Admitting: Cardiology

## 2020-01-29 ENCOUNTER — Other Ambulatory Visit: Payer: Self-pay | Admitting: Cardiology

## 2020-01-30 ENCOUNTER — Other Ambulatory Visit: Payer: Self-pay | Admitting: Cardiology

## 2020-02-08 ENCOUNTER — Telehealth: Payer: Self-pay

## 2020-02-08 NOTE — Telephone Encounter (Signed)
Pt called into office to report bruising.  Pt states he noticed 2 bruises, one on inner thigh and one on his buttocks.  Bruises are slightly larger than a quarter in size.  Pt states he does not remember bumping or injuring himself in any way to cause these bruises.  Pt denies new medications, not abxs or steroids taken.  Pt also denies any nausea, vomiting or diarrhea. Advised pt to continue to monitor bruises.  Call us back if bruises continue to grow in size, or multiply in number, and we can get pt an appt to come into the office for an INR check.  Pt verbalized understanding.

## 2020-02-10 ENCOUNTER — Other Ambulatory Visit: Payer: Self-pay | Admitting: Cardiology

## 2020-02-14 ENCOUNTER — Other Ambulatory Visit: Payer: Self-pay | Admitting: Pharmacist Clinician (PhC)/ Clinical Pharmacy Specialist

## 2020-02-14 ENCOUNTER — Telehealth: Payer: Self-pay | Admitting: Cardiology

## 2020-02-14 MED ORDER — HYDROCHLOROTHIAZIDE 25 MG PO TABS
25.0000 mg | ORAL_TABLET | Freq: Every day | ORAL | 3 refills | Status: DC
Start: 2020-02-14 — End: 2021-01-29

## 2020-02-14 MED ORDER — LOSARTAN POTASSIUM 100 MG PO TABS
100.0000 mg | ORAL_TABLET | Freq: Every day | ORAL | 3 refills | Status: DC
Start: 2020-02-14 — End: 2021-01-29

## 2020-02-14 NOTE — Telephone Encounter (Signed)
Called patient left message on personal voice mail Dr.Jordan advised we will give you separate prescriptions.Prescriptions sent to pharmacy.

## 2020-02-14 NOTE — Telephone Encounter (Signed)
Pt c/o medication issue:  1. Name of Medication: losartan-hydrochlorothiazide (HYZAAR) 100-25 MG tablet  2. How are you currently taking this medication (dosage and times per day)? Pt has not taken the medication since Wednesday  3. Are you having a reaction (difficulty breathing--STAT)? no  4. What is your medication issue? Patient was told by the pharmacy that this medication is on backorder for the pharmacy. He wanted to know if there was a substitution  Patient also said the Pharmacy was supposed to reach out to Dr. [place]

## 2020-02-14 NOTE — Telephone Encounter (Signed)
Yes I would just give him separate prescriptions for losartan and HCT  Jaray Boliver [place] MD, [facility]

## 2020-02-15 ENCOUNTER — Ambulatory Visit (INDEPENDENT_AMBULATORY_CARE_PROVIDER_SITE_OTHER): Payer: BC Managed Care – PPO | Admitting: *Deleted

## 2020-02-15 ENCOUNTER — Other Ambulatory Visit: Payer: Self-pay

## 2020-02-15 DIAGNOSIS — Z5181 Encounter for therapeutic drug level monitoring: Secondary | ICD-10-CM | POA: Diagnosis not present

## 2020-02-15 LAB — POCT INR: INR: 3 (ref 2.0–3.0)

## 2020-02-15 NOTE — Patient Instructions (Signed)
Description   Continue on same dosage of Warfarin 1 tablet daily except 1.5 tablets on Tuesdays, Thursdays, and Saturdays.  Repeat INR in 8 weeks. Call us with any medication changes or concerns # [phone] [facility], Main # [phone], Fax #[phone] or [phone]     

## 2020-02-28 ENCOUNTER — Other Ambulatory Visit: Payer: BC Managed Care – PPO

## 2020-03-01 ENCOUNTER — Other Ambulatory Visit: Payer: BC Managed Care – PPO

## 2020-03-08 ENCOUNTER — Ambulatory Visit: Payer: BC Managed Care – PPO | Admitting: Urology

## 2020-04-11 ENCOUNTER — Other Ambulatory Visit: Payer: Self-pay

## 2020-04-11 ENCOUNTER — Ambulatory Visit (INDEPENDENT_AMBULATORY_CARE_PROVIDER_SITE_OTHER): Payer: BC Managed Care – PPO | Admitting: *Deleted

## 2020-04-11 DIAGNOSIS — Z5181 Encounter for therapeutic drug level monitoring: Secondary | ICD-10-CM

## 2020-04-11 LAB — POCT INR: INR: 2.8 (ref 2.0–3.0)

## 2020-04-11 NOTE — Patient Instructions (Signed)
Description   Continue taking Warfarin 1 tablet daily except 1.5 tablets on Tuesdays, Thursdays, and Saturdays.  Repeat INR in 8 weeks. Call us with any medication changes or concerns # [phone] [facility], Main # [phone], Fax [phone] or [phone]

## 2020-04-12 ENCOUNTER — Other Ambulatory Visit: Payer: BC Managed Care – PPO

## 2020-04-12 DIAGNOSIS — R972 Elevated prostate specific antigen [PSA]: Secondary | ICD-10-CM

## 2020-04-12 NOTE — Addendum Note (Signed)
Addended by: Valentina Lucks on: 04/12/2020 05:11 PM   Modules accepted: Orders

## 2020-04-13 LAB — PSA: Prostate Specific Ag, Serum: 7 ng/mL — ABNORMAL HIGH (ref 0.0–4.0)

## 2020-04-18 ENCOUNTER — Ambulatory Visit (INDEPENDENT_AMBULATORY_CARE_PROVIDER_SITE_OTHER): Payer: BC Managed Care – PPO | Admitting: Urology

## 2020-04-18 ENCOUNTER — Other Ambulatory Visit: Payer: Self-pay

## 2020-04-18 VITALS — BP 143/68 | HR 60 | Temp 98.4°F | Ht 69.75 in | Wt 162.0 lb

## 2020-04-18 DIAGNOSIS — R972 Elevated prostate specific antigen [PSA]: Secondary | ICD-10-CM

## 2020-04-18 DIAGNOSIS — R3915 Urgency of urination: Secondary | ICD-10-CM

## 2020-04-18 LAB — URINALYSIS, ROUTINE W REFLEX MICROSCOPIC
Bilirubin, UA: NEGATIVE
Glucose, UA: NEGATIVE
Ketones, UA: NEGATIVE
Leukocytes,UA: NEGATIVE
Nitrite, UA: NEGATIVE
Protein,UA: NEGATIVE
Specific Gravity, UA: 1.02 (ref 1.005–1.030)
Urobilinogen, Ur: 0.2 mg/dL (ref 0.2–1.0)
pH, UA: 7 (ref 5.0–7.5)

## 2020-04-18 LAB — MICROSCOPIC EXAMINATION
Bacteria, UA: NONE SEEN
Epithelial Cells (non renal): NONE SEEN /hpf (ref 0–10)
RBC, Urine: NONE SEEN /hpf (ref 0–2)
Renal Epithel, UA: NONE SEEN /hpf
WBC, UA: NONE SEEN /hpf (ref 0–5)

## 2020-04-18 NOTE — Progress Notes (Signed)

## 2020-04-18 NOTE — Patient Instructions (Signed)

## 2020-04-18 NOTE — Progress Notes (Signed)
04/18/2020 3:01 PM   [name] [birth year] [id number]  Referring provider: Margarito Courser, MD [address] [place],  [place] [address]  followup elevated PSA and urinary urgency  HPI: Mr Busche is a 70yo here for followup for elevated PSA and urinary urgency. PSA decreased to 7.0 from 7.6. He had a negative prostate biopsy 6 months ago. He denies any change in his urination. He has mild LUTS on no BPH therapy. His most bothersome LUT is urinary urgency which he mitigates with timed voiding. NO complaints today  His records from [facility] are as follows: I have urinary urgency.  HPI: Michai Dieppa is a 70 year-old male established patient who is here for urinary urgency.  He does have urgency. He does not have problems getting to the bathroom in time after he has the urge to urinate. The condition started approximately 08/12/2011. His symptoms have not gotten worse over the last year.   He does not wear protective pads. He generally urinates every 2 hours in the daytime. He gets up at night to urinate 1 time. He is not having problems with emptying his bladder well.   08/13/2015: He has urgency but uses timed voiding which has helped his urgency. No urge incontinence. He is not bothered by the urgency.   03/15/2016: He has urgency every 2-2.5 hours.   09/13/2016: He is doing timed voiding which is helping with his urgency.   04/01/2017: He has stable urgency with decreased caffeine intake and timed voiding. He does wear a pad for protection.   10/03/2017: He notes changing position causes his urgency to worsen. no other LUTS. He is not bothered enough to try medications   03/26/2018: He has urgency related to running water, orange juice. He is not bothered by his LUTS.   09/29/2018: His urgency has improved since last visit with fluid management.   07/29/2019: Nocturia 1x. Urinary urgency is stable with timed voiding. Good stream. No straining to urinate     CC: My PSA is elevated  above the normal range.  HPI: His PSA is 7. He has had PSA's drawn prior to this one. He has had elevated PSA's prior to this one. He indicates there were no urinary problems when the PSA was drawn. He has not had a prostate nodule on a physical examination. He has not had recurrent prostate infections or chronic prostatitis.   He has not been on antibiotics for prostate infections previously. He has had a prostate biopsy done. His first prostate biopsy was done approximately 08/11/2012.   08/13/2015: PSA 5.46. He has mild LUTS.   03/15/2016: PSA is stable at 5.45   09/13/2016: PSA has decreased to 4.61   04/01/2017: PSa decreased to 4.24   10/03/2017: PSA increased to 4.96. no worsening LUTS.   03/26/2018: PSA stable at 4.99. no new LUTS.   09/29/2018: PSA increased to 5.48. No worsening LUTS. he has stable urinary urgency and urinary frequency   07/29/2019: PSA decreased to 7.6 from 11. He has stable urinary urgency which he manages with timed voiding.     AUA Symptom Score: Less than 50% of the time he has the sensation of not emptying his bladder completely when finished urinating. 50% of the time he has to urinate again fewer than two hours after he has finished urinating. Less than 50% of the time he has to start and stop again several times when he urinates. 50% of the time he finds it difficult to postpone urination. Less  than 50% of the time he has a weak urinary stream. Less than 50% of the time he has to push or strain to begin urination. He has to get up to urinate 1 time from the time he goes to bed until the time he gets up in the morning.   Calculated AUA Symptom Score: 15    QOL Score: He would feel pleased if he had to live with his urinary condition the way it is now for the rest of his life.   Calculated QOL Symptom Score: 1      PMH: Past Medical History:  Diagnosis Date   Chronic anticoagulation    HTN (hypertension)    Hypercholesteremia    Severe aortic stenosis      Surgical History: Past Surgical History:  Procedure Laterality Date   AORTIC VALVE REPLACEMENT  2007   #21 MM [facility] VALVE   VASECTOMY      Home Medications:  Allergies as of 04/18/2020   No Known Allergies     Medication List       Accurate as of April 18, 2020  3:01 PM. If you have any questions, ask your nurse or doctor.        Ergocalciferol 50 MCG (2000 UT) Tabs Take 2,000 Units by mouth daily.   ferrous sulfate 325 (65 FE) MG EC tablet Take 1 tablet by mouth daily.   hydrochlorothiazide 25 MG tablet Commonly known as: HYDRODIURIL Take 1 tablet (25 mg total) by mouth daily.   latanoprost 0.005 % ophthalmic solution Commonly known as: XALATAN 1 drop at bedtime.   losartan 100 MG tablet Commonly known as: COZAAR Take 1 tablet (100 mg total) by mouth daily.   metoprolol tartrate 25 MG tablet Commonly known as: LOPRESSOR TAKE 1 TABLET BY MOUTH EVERY DAY   rosuvastatin 10 MG tablet Commonly known as: CRESTOR TAKE 1 TABLET BY MOUTH EVERY DAY   warfarin 5 MG tablet Commonly known as: COUMADIN Take as directed by the anticoagulation clinic. If you are unsure how to take this medication, talk to your nurse or doctor. Original instructions: TAKE 1 TO 1 AND 1/2 TABLETS DAILY AS DIRECTED BY COUMADIN CLINIC       Allergies: No Known Allergies  Family History: Family History  Problem Relation Age of Onset   Hypertension Mother     Social History:  reports that he has never smoked. He has never used smokeless tobacco. He reports that he does not drink alcohol and does not use drugs.  ROS: All other review of systems were reviewed and are negative except what is noted above in HPI  Physical Exam: BP (!) 143/68    Pulse 60    Temp 98.4 F (36.9 C)    Ht 5' 9.75" (1.772 m)    Wt 162 lb (73.5 kg)    BMI 23.41 kg/m   Constitutional:  Alert and oriented, No acute distress. HEENT: [place] AT, moist mucus membranes.  Trachea midline, no  masses. Cardiovascular: No clubbing, cyanosis, or edema. Respiratory: Normal respiratory effort, no increased work of breathing. GI: Abdomen is soft, nontender, nondistended, no abdominal masses GU: No CVA tenderness.  Lymph: No cervical or inguinal lymphadenopathy. Skin: No rashes, bruises or suspicious lesions. Neurologic: Grossly intact, no focal deficits, moving all 4 extremities. Psychiatric: Normal mood and affect.  Laboratory Data: Lab Results  Component Value Date   WBC 9.5 10/02/2018   HGB 10.7 (L) 10/02/2018   HCT 33.2 (L) 10/02/2018   MCV  90.5 10/02/2018   PLT 150 10/02/2018    Lab Results  Component Value Date   CREATININE 2.29 (H) 10/02/2018    No results found for: PSA  No results found for: TESTOSTERONE  No results found for: HGBA1C  Urinalysis    Component Value Date/Time   COLORURINE AMBER (A) 10/02/2018 2301   APPEARANCEUR Clear 04/18/2020 1416   LABSPEC 1.019 10/02/2018 2301   PHURINE 5.0 10/02/2018 2301   GLUCOSEU Negative 04/18/2020 1416   HGBUR LARGE (A) 10/02/2018 2301   BILIRUBINUR Negative 04/18/2020 1416   KETONESUR 5 (A) 10/02/2018 2301   PROTEINUR Negative 04/18/2020 1416   PROTEINUR 100 (A) 10/02/2018 2301   NITRITE Negative 04/18/2020 1416   NITRITE NEGATIVE 10/02/2018 2301   LEUKOCYTESUR Negative 04/18/2020 1416   LEUKOCYTESUR LARGE (A) 10/02/2018 2301    Lab Results  Component Value Date   LABMICR See below: 04/18/2020   WBCUA None seen 04/18/2020   LABEPIT None seen 04/18/2020   BACTERIA None seen 04/18/2020    Pertinent Imaging:  No results found for this or any previous visit.  No results found for this or any previous visit.  No results found for this or any previous visit.  No results found for this or any previous visit.  No results found for this or any previous visit.  No results found for this or any previous visit.  No results found for this or any previous visit.  No results found for this or any  previous visit.   Assessment & Plan:    1. Elevated PSA -RTC 6 months with PSA - Urinalysis, Routine w reflex microscopic  2. Urinary urgency -continue timed voiding and fluid management   No follow-ups on file.  Nicolette Bang, MD  [facility] [place]

## 2020-04-25 ENCOUNTER — Encounter: Payer: Self-pay | Admitting: Urology

## 2020-06-06 ENCOUNTER — Ambulatory Visit (INDEPENDENT_AMBULATORY_CARE_PROVIDER_SITE_OTHER): Payer: BC Managed Care – PPO

## 2020-06-06 ENCOUNTER — Other Ambulatory Visit: Payer: Self-pay

## 2020-06-06 DIAGNOSIS — Z7901 Long term (current) use of anticoagulants: Secondary | ICD-10-CM

## 2020-06-06 DIAGNOSIS — Z5181 Encounter for therapeutic drug level monitoring: Secondary | ICD-10-CM | POA: Diagnosis not present

## 2020-06-06 LAB — POCT INR: INR: 3.2 — AB (ref 2.0–3.0)

## 2020-06-06 NOTE — Patient Instructions (Signed)
Description   Continue taking Warfarin 1 tablet daily except 1.5 tablets on Tuesdays, Thursdays, and Saturdays.  Repeat INR in 8 weeks. Call us with any medication changes or concerns # [phone] [facility], Main # [phone], Fax [phone] or [phone]

## 2020-08-01 ENCOUNTER — Other Ambulatory Visit: Payer: Self-pay

## 2020-08-01 ENCOUNTER — Ambulatory Visit (INDEPENDENT_AMBULATORY_CARE_PROVIDER_SITE_OTHER): Payer: BC Managed Care – PPO | Admitting: Pharmacist

## 2020-08-01 DIAGNOSIS — Z5181 Encounter for therapeutic drug level monitoring: Secondary | ICD-10-CM

## 2020-08-01 LAB — POCT INR: INR: 3 (ref 2.0–3.0)

## 2020-08-01 NOTE — Patient Instructions (Signed)
Description   Continue taking Warfarin 1 tablet daily except 1.5 tablets on Tuesdays, Thursdays, and Saturdays.  Repeat INR in 8 weeks. Call us with any medication changes or concerns # [phone] [facility], Main # [phone], Fax [phone] or [phone]

## 2020-08-18 ENCOUNTER — Other Ambulatory Visit: Payer: Self-pay | Admitting: Cardiology

## 2020-09-26 ENCOUNTER — Other Ambulatory Visit: Payer: Self-pay

## 2020-09-26 ENCOUNTER — Ambulatory Visit (INDEPENDENT_AMBULATORY_CARE_PROVIDER_SITE_OTHER): Payer: BC Managed Care – PPO | Admitting: *Deleted

## 2020-09-26 DIAGNOSIS — Z5181 Encounter for therapeutic drug level monitoring: Secondary | ICD-10-CM

## 2020-09-26 LAB — POCT INR: INR: 2.9 (ref 2.0–3.0)

## 2020-09-26 NOTE — Patient Instructions (Signed)
Description   Continue taking Warfarin 1 tablet daily except 1.5 tablets on Tuesdays, Thursdays, and Saturdays.  Repeat INR in 8 weeks. Call us with any medication changes or concerns # [phone] [facility], Main # [phone], Fax [phone] or [phone]

## 2020-10-11 ENCOUNTER — Other Ambulatory Visit: Payer: Self-pay

## 2020-10-11 ENCOUNTER — Other Ambulatory Visit: Payer: BC Managed Care – PPO

## 2020-10-11 DIAGNOSIS — R972 Elevated prostate specific antigen [PSA]: Secondary | ICD-10-CM

## 2020-10-12 LAB — PSA: Prostate Specific Ag, Serum: 8 ng/mL — ABNORMAL HIGH (ref 0.0–4.0)

## 2020-10-18 ENCOUNTER — Ambulatory Visit: Payer: BC Managed Care – PPO | Admitting: Urology

## 2020-10-19 NOTE — Progress Notes (Signed)
Sent via mychart and mail. 

## 2020-10-25 ENCOUNTER — Other Ambulatory Visit: Payer: Self-pay

## 2020-10-25 ENCOUNTER — Telehealth (INDEPENDENT_AMBULATORY_CARE_PROVIDER_SITE_OTHER): Payer: Medicare Other | Admitting: Urology

## 2020-10-25 ENCOUNTER — Encounter: Payer: Self-pay | Admitting: Urology

## 2020-10-25 DIAGNOSIS — R3915 Urgency of urination: Secondary | ICD-10-CM

## 2020-10-25 DIAGNOSIS — R972 Elevated prostate specific antigen [PSA]: Secondary | ICD-10-CM

## 2020-10-25 NOTE — Patient Instructions (Signed)
Prostate-Specific Antigen Test Why am I having this test? The prostate-specific antigen (PSA) test is a screening test for prostate cancer. It can identify early signs of prostate cancer, which may allow for more effective treatment. Your health care provider may recommend that you have a PSA test starting at age 70 or that you have one earlier or later, depending on your risk factors for prostate cancer. You may also have a PSA test: To monitor treatment of prostate cancer. To check whether prostate cancer has returned after treatment. If you have signs of other conditions that can affect PSA levels, such as: An enlarged prostate that is not caused by cancer (benign prostatic hyperplasia, BPH). This condition is very common in older men. A prostate infection. What is being tested? This test measures the amount of PSA in your blood. PSA is a protein that is made in the prostate. The prostate naturally produces more PSA as you age, but very high levels may be a sign of a medical condition. What kind of sample is taken? A blood sample is required for this test. It is usually collected by inserting a needle into a blood vessel or by sticking a finger with a small needle. Blood for this test should be drawn before having an exam of the prostate. How do I prepare for this test? Do not ejaculate starting 24 hours before your test, or as long as told by your health care provider. Tell a health care provider about: Any allergies you have. All medicines you are taking, including vitamins, herbs, eye drops, creams, and over-the-counter medicines. This also includes: Medicines to assist with hair growth, such as finasteride. Any recent exposure to a medicine called diethylstilbestrol. Any blood disorders you have. Any recent procedures you have had, especially any procedures involving the prostate or rectum. Any medical conditions you have. Any recent urinary tract infections (UTIs) you have had. How are  the results reported? Your test results will be reported as a value that indicates how much PSA is in your blood. This will be given as nanograms of PSA per milliliter of blood (ng/mL). Your health care provider will compare your results to normal ranges that were established after testing a large group of people (reference ranges). Reference ranges may vary among labs and hospitals. PSA levels vary from person to person and generally increase with age. Because of this variation, there is no single PSA value that is considered normal for everyone. Instead, PSA reference ranges are used to describe whether your PSA levels are considered low or high (elevated). Common reference ranges are: Low: 0-2.5 ng/mL. Slightly to moderately elevated: 2.6-10.0 ng/mL. Moderately elevated: 10.0-19.9 ng/mL. Significantly elevated: 20 ng/mL or greater. Sometimes, the test results may report that a condition is present when it is not present (false-positive result). What do the results mean? A test result that is higher than 4 ng/mL may mean that you are at an increased risk for prostate cancer. However, a PSA test by itself is not enough to diagnose prostate cancer. High PSA levels may also be caused by the natural aging process, prostate infection, or BPH. PSA screening cannot tell you if your PSA is high due to cancer or a different cause. A prostate biopsy is the only way to diagnose prostate cancer. A risk of having the PSA test is diagnosing and treating prostate cancer that would never have caused any symptoms or problems (overdiagnosis and overtreatment). Talk with your health care provider about what your results mean. Questions  to ask your health care provider Ask your health care provider, or the department that is doing the test: When will my results be ready? How will I get my results? What are my treatment options? What other tests do I need? What are my next steps? Summary The prostate-specific  antigen (PSA) test is a screening test for prostate cancer. Your health care provider may recommend that you have a PSA test starting at age 70 or that you have one earlier or later, depending on your risk factors for prostate cancer. A test result that is higher than 4 ng/mL may mean that you are at an increased risk for prostate cancer. However, elevated levels can be caused by a number of conditions other than prostate cancer. Talk with your health care provider about what your results mean. This information is not intended to replace advice given to you by your health care provider. Make sure you discuss any questions you have with your health care provider. Document Revised: 10/14/2019 Document Reviewed: 10/14/2019 Elsevier Patient Education  2022 [employer].

## 2020-10-25 NOTE — Progress Notes (Signed)
10/25/2020 11:26 AM   [name] Nov 21, 1950 [id number]  Referring provider: Margarito Courser, MD [address] [place],  [place] [address]  Patient location: home Physician location: office I connected with  [name] on 10/25/20 by a video enabled telemedicine application and verified that I am speaking with the correct person using two identifiers.   I discussed the limitations of evaluation and management by telemedicine. The patient expressed understanding and agreed to proceed.    Followup elevated PSA  HPI: Mr Freer is a 70yo here for followup for elevated PSA. PSA increased to 8.0 from 7.0. His PSA was 7.9 when he had his prostate biopsy. He has a strong urinary stream. No strainign to urinate. Nocturia 1-2x. No hematuria or dysuria. He has occasional urgency especially when he stands from a sitting position. The urgency dies not bother him. No other complaints today   PMH: Past Medical History:  Diagnosis Date   Chronic anticoagulation    HTN (hypertension)    Hypercholesteremia    Severe aortic stenosis     Surgical History: Past Surgical History:  Procedure Laterality Date   AORTIC VALVE REPLACEMENT  2007   #21 MM [facility] VALVE   VASECTOMY      Home Medications:  Allergies as of 10/25/2020   No Known Allergies      Medication List        Accurate as of October 25, 2020 11:26 AM. If you have any questions, ask your nurse or doctor.          Ergocalciferol 50 MCG (2000 UT) Tabs Take 2,000 Units by mouth daily.   ferrous sulfate 325 (65 FE) MG EC tablet Take 1 tablet by mouth daily.   hydrochlorothiazide 25 MG tablet Commonly known as: HYDRODIURIL Take 1 tablet (25 mg total) by mouth daily.   latanoprost 0.005 % ophthalmic solution Commonly known as: XALATAN 1 drop at bedtime.   losartan 100 MG tablet Commonly known as: COZAAR Take 1 tablet (100 mg total) by mouth daily.   metoprolol tartrate 25 MG  tablet Commonly known as: LOPRESSOR TAKE 1 TABLET BY MOUTH EVERY DAY   rosuvastatin 10 MG tablet Commonly known as: CRESTOR TAKE 1 TABLET BY MOUTH EVERY DAY   warfarin 5 MG tablet Commonly known as: COUMADIN Take as directed by the anticoagulation clinic. If you are unsure how to take this medication, talk to your nurse or doctor. Original instructions: TAKE 1 TO 1 AND 1/2 TABLETS DAILY AS DIRECTED BY COUMADIN CLINIC        Allergies: No Known Allergies  Family History: Family History  Problem Relation Age of Onset   Hypertension Mother     Social History:  reports that he has never smoked. He has never used smokeless tobacco. He reports that he does not drink alcohol and does not use drugs.  ROS: All other review of systems were reviewed and are negative except what is noted above in HPI   Laboratory Data: Lab Results  Component Value Date   WBC 9.5 10/02/2018   HGB 10.7 (L) 10/02/2018   HCT 33.2 (L) 10/02/2018   MCV 90.5 10/02/2018   PLT 150 10/02/2018    Lab Results  Component Value Date   CREATININE 2.29 (H) 10/02/2018    No results found for: PSA  No results found for: TESTOSTERONE  No results found for: HGBA1C  Urinalysis    Component Value Date/Time   COLORURINE AMBER (A) 10/02/2018 2301   APPEARANCEUR  Clear 04/18/2020 1416   LABSPEC 1.019 10/02/2018 2301   PHURINE 5.0 10/02/2018 2301   GLUCOSEU Negative 04/18/2020 1416   HGBUR LARGE (A) 10/02/2018 2301   BILIRUBINUR Negative 04/18/2020 1416   KETONESUR 5 (A) 10/02/2018 2301   PROTEINUR Negative 04/18/2020 1416   PROTEINUR 100 (A) 10/02/2018 2301   NITRITE Negative 04/18/2020 1416   NITRITE NEGATIVE 10/02/2018 2301   LEUKOCYTESUR Negative 04/18/2020 1416   LEUKOCYTESUR LARGE (A) 10/02/2018 2301    Lab Results  Component Value Date   LABMICR See below: 04/18/2020   WBCUA None seen 04/18/2020   LABEPIT None seen 04/18/2020   BACTERIA None seen 04/18/2020    Pertinent Imaging:  No  results found for this or any previous visit.  No results found for this or any previous visit.  No results found for this or any previous visit.  No results found for this or any previous visit.  No results found for this or any previous visit.  No results found for this or any previous visit.  No results found for this or any previous visit.  No results found for this or any previous visit.   Assessment & Plan:    1. Elevated PSA -RTC 6 months with PSA  2. Urinary urgency -patient defers therapy at this time   No follow-ups on file.  Nicolette Bang, MD  [facility] [place]

## 2020-10-28 ENCOUNTER — Other Ambulatory Visit: Payer: Self-pay | Admitting: Cardiology

## 2020-11-24 ENCOUNTER — Other Ambulatory Visit: Payer: Self-pay

## 2020-11-24 ENCOUNTER — Ambulatory Visit (INDEPENDENT_AMBULATORY_CARE_PROVIDER_SITE_OTHER): Payer: Medicare Other | Admitting: *Deleted

## 2020-11-24 DIAGNOSIS — Z5181 Encounter for therapeutic drug level monitoring: Secondary | ICD-10-CM | POA: Diagnosis not present

## 2020-11-24 LAB — POCT INR: INR: 4.4 — AB (ref 2.0–3.0)

## 2020-11-24 NOTE — Patient Instructions (Signed)
Description   Hold warfarin tomorrow, then Continue taking Warfarin 1 tablet daily except 1.5 tablets on Tuesdays, Thursdays, and Saturdays.  Repeat INR in 2 weeks. Call us with any medication changes or concerns # [phone] [facility], Main # [phone], Fax [phone] or [phone]

## 2020-12-01 NOTE — Progress Notes (Signed)
Nathan Frye Date of Birth: Jul 05, 1950   History of Present Illness: Nathan Frye is seen today for followup of his aortic valve disease. 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 Last Echo in July 2017 showed normal valve function.   On follow up today he is doing very well. He denies any  symptoms of chest pain, shortness of breath, or palpitations. He continues to exercise regularly 5 days a week. He is now retired from [employer].    Current Outpatient Medications on File Prior to Visit  Medication Sig Dispense Refill   Ergocalciferol 2000 UNITS TABS Take 2,000 Units by mouth daily.     ferrous sulfate 325 (65 FE) MG EC tablet Take 1 tablet by mouth daily.     hydrochlorothiazide (HYDRODIURIL) 25 MG tablet Take 1 tablet (25 mg total) by mouth daily. 90 tablet 3   latanoprost (XALATAN) 0.005 % ophthalmic solution 1 drop at bedtime.     losartan (COZAAR) 100 MG tablet Take 1 tablet (100 mg total) by mouth daily. 90 tablet 3   metoprolol tartrate (LOPRESSOR) 25 MG tablet TAKE 1 TABLET BY MOUTH EVERY DAY 90 tablet 3   rosuvastatin (CRESTOR) 10 MG tablet TAKE 1 TABLET BY MOUTH EVERY DAY 90 tablet 1   warfarin (COUMADIN) 5 MG tablet TAKE 1 TO 1 AND 1/2 TABLETS DAILY AS DIRECTED BY COUMADIN CLINIC 135 tablet 1   No current facility-administered medications on file prior to visit.    No Known Allergies  Past Medical History:  Diagnosis Date   Chronic anticoagulation    HTN (hypertension)    Hypercholesteremia    Severe aortic stenosis     Past Surgical History:  Procedure Laterality Date   AORTIC VALVE REPLACEMENT  2007   #21 MM [facility] VALVE   VASECTOMY      Social History   Tobacco Use  Smoking Status Never  Smokeless Tobacco Never    Social History   Substance and Sexual Activity  Alcohol Use No   Alcohol/week: 0.0 standard drinks    Family History  Problem Relation Age of Onset   Hypertension Mother     Review of  Systems: As noted history of present illness.  All other systems were reviewed and are negative.  Physical Exam: BP 138/70   Pulse 65   Ht 5\' 10"  (1.778 m)   Wt 168 lb (76.2 kg)   SpO2 97%   BMI 24.11 kg/m  GENERAL:  Well appearing BM in NAD HEENT:  PERRL, EOMI, sclera are clear. Oropharynx is clear. NECK:  No jugular venous distention, carotid upstroke brisk and symmetric, no bruits, no thyromegaly or adenopathy LUNGS:  Clear to auscultation bilaterally CHEST:  Unremarkable HEART:  RRR,  PMI not displaced or sustained,S1 and S2 within normal limits, no S3, no S4: good mechanical AV click. Soft 0-8/6 systolic murmur at the apex. ABD:  Soft, nontender. BS +, no masses or bruits. No hepatomegaly, no splenomegaly EXT:  2 + pulses throughout, no edema, no cyanosis no clubbing SKIN:  Warm and dry.  No rashes NEURO:  Alert and oriented x 3. Cranial nerves II through XII intact. PSYCH:  Cognitively intact    LABORATORY DATA: Lab Results  Component Value Date   WBC 9.5 10/02/2018   HGB 10.7 (L) 10/02/2018   HCT 33.2 (L) 10/02/2018   PLT 150 10/02/2018   GLUCOSE 127 (H) 10/02/2018   CHOL 163 05/02/2011   TRIG 75.0 05/02/2011  HDL 67.00 05/02/2011   LDLCALC 81 05/02/2011   ALT 24 10/02/2018   AST 38 10/02/2018   NA 135 10/02/2018   K 4.1 10/02/2018   CL 100 10/02/2018   CREATININE 2.29 (H) 10/02/2018   BUN 34 (H) 10/02/2018   CO2 25 10/02/2018   INR 4.4 (A) 11/24/2020     LABS reviewed from 04/27/15. Hgb 13.1. CMET normal, Cholesterol 167, Triglycerides 80, HDL 80, LDL 71. TSH normal. From 06/19/16: cholesterol 165, triglycerides 53. HDL 77, LDL 77. Hgb 13.2. CMET and TSH normal. Dated 09/16/17: Hgb 12.7. CMET normal. Cholesterol 175, triglycerides 64, HDL 79, LDL 83. TSH normal.  Dated 01/06/19: creatinine 1.25. otherwise CMET normal Dated 07/29/19: cholesterol 172, triglycerides 56, HDL 68, LDL 93. Hgb 12.2. Dated 01/31/20: cholesterol 170, triglycerides 71, HDL 68, LDL 88.  Creatinine 1.2 otherwise CMET normal.   Ecg today shows NSR rate 65.  Occ. Normal. I have personally reviewed and interpreted this study.   Echo: 09/11/15: Study Conclusions   - Left ventricle: The cavity size was normal. Wall thickness was   normal. Systolic function was normal. The estimated ejection   fraction was in the range of 55% to 60%. Wall motion was normal;   there were no regional wall motion abnormalities. Left   ventricular diastolic function parameters were normal. - Aortic valve: A mechanical prosthesis was present and functioning   normally. There was trivial regurgitation. Valve area (VTI): 1.58   cm^2. Valve area (Vmax): 1.62 cm^2. Valve area (Vmean): 1.72   cm^2. - Mitral valve: There was mild regurgitation.    Assessment / Plan:  1. Status post mechanical aortic valve replacement 2007. Satisfactory Echo 2017.  Clinically stable. Continue chronic anticoagulation.  Routine SBE prophylaxis. I will followup again in one year  2. Hypertension, well controlled. Continue Hyzaar and metoprolol.   3. Hypercholesterolemia-on Crestor. good control.

## 2020-12-04 ENCOUNTER — Ambulatory Visit (INDEPENDENT_AMBULATORY_CARE_PROVIDER_SITE_OTHER): Payer: Medicare Other | Admitting: Cardiology

## 2020-12-04 ENCOUNTER — Other Ambulatory Visit: Payer: Self-pay

## 2020-12-04 ENCOUNTER — Encounter: Payer: Self-pay | Admitting: Cardiology

## 2020-12-04 VITALS — BP 138/70 | HR 65 | Ht 70.0 in | Wt 168.0 lb

## 2020-12-04 DIAGNOSIS — Z7901 Long term (current) use of anticoagulants: Secondary | ICD-10-CM

## 2020-12-04 DIAGNOSIS — I10 Essential (primary) hypertension: Secondary | ICD-10-CM

## 2020-12-04 DIAGNOSIS — Z952 Presence of prosthetic heart valve: Secondary | ICD-10-CM | POA: Diagnosis not present

## 2020-12-07 ENCOUNTER — Ambulatory Visit (INDEPENDENT_AMBULATORY_CARE_PROVIDER_SITE_OTHER): Payer: Medicare Other

## 2020-12-07 ENCOUNTER — Other Ambulatory Visit: Payer: Self-pay

## 2020-12-07 DIAGNOSIS — Z952 Presence of prosthetic heart valve: Secondary | ICD-10-CM | POA: Diagnosis not present

## 2020-12-07 DIAGNOSIS — Z5181 Encounter for therapeutic drug level monitoring: Secondary | ICD-10-CM

## 2020-12-07 LAB — POCT INR: INR: 3.1 — AB (ref 2.0–3.0)

## 2020-12-07 NOTE — Patient Instructions (Signed)
Description   Continue on same dosage of Warfarin 1 tablet daily except 1.5 tablets on Tuesdays, Thursdays, and Saturdays.  Repeat INR in 3 weeks. Call us with any medication changes or concerns # [phone] [facility], Main # [phone], Fax [phone] or [phone]

## 2020-12-28 ENCOUNTER — Ambulatory Visit (INDEPENDENT_AMBULATORY_CARE_PROVIDER_SITE_OTHER): Payer: Medicare Other | Admitting: *Deleted

## 2020-12-28 ENCOUNTER — Other Ambulatory Visit: Payer: Self-pay

## 2020-12-28 DIAGNOSIS — Z5181 Encounter for therapeutic drug level monitoring: Secondary | ICD-10-CM

## 2020-12-28 LAB — POCT INR: INR: 4 — AB (ref 2.0–3.0)

## 2020-12-28 NOTE — Patient Instructions (Signed)
Description   Do not take any Warfarin today then continue taking Warfarin 1 tablet daily except 1.5 tablets on Tuesdays, Thursdays, and Saturdays.  Repeat INR in 3 weeks. Call us with any medication changes or concerns # [phone] [facility], Main # [phone], Fax [phone] or [phone]

## 2021-01-18 ENCOUNTER — Other Ambulatory Visit: Payer: Self-pay

## 2021-01-18 ENCOUNTER — Ambulatory Visit (INDEPENDENT_AMBULATORY_CARE_PROVIDER_SITE_OTHER): Payer: Medicare Other

## 2021-01-18 DIAGNOSIS — Z5181 Encounter for therapeutic drug level monitoring: Secondary | ICD-10-CM | POA: Diagnosis not present

## 2021-01-18 LAB — POCT INR: INR: 3.1 — AB (ref 2.0–3.0)

## 2021-01-18 NOTE — Patient Instructions (Signed)
Description   Continue taking Warfarin 1 tablet daily except 1.5 tablets on Tuesdays, Thursdays, and Saturdays.  Repeat INR in 4 weeks. Call us with any medication changes or concerns # [phone] [facility], Main # [phone], Fax [phone] or [phone]

## 2021-01-28 ENCOUNTER — Other Ambulatory Visit: Payer: Self-pay | Admitting: Cardiology

## 2021-01-29 ENCOUNTER — Ambulatory Visit: Payer: BC Managed Care – PPO | Admitting: Cardiology

## 2021-02-15 ENCOUNTER — Ambulatory Visit (INDEPENDENT_AMBULATORY_CARE_PROVIDER_SITE_OTHER): Payer: Medicare Other | Admitting: *Deleted

## 2021-02-15 ENCOUNTER — Other Ambulatory Visit: Payer: Self-pay

## 2021-02-15 DIAGNOSIS — Z5181 Encounter for therapeutic drug level monitoring: Secondary | ICD-10-CM

## 2021-02-15 LAB — POCT INR: INR: 2.9 (ref 2.0–3.0)

## 2021-02-15 NOTE — Patient Instructions (Addendum)
Description   Continue taking Warfarin 1 tablet daily except 1.5 tablets on Tuesdays, Thursdays, and Saturdays. Repeat INR in 4 weeks. Call us with any medication changes or concerns # [phone] [facility], Main # [phone], Fax [phone] or [phone]

## 2021-02-18 ENCOUNTER — Other Ambulatory Visit: Payer: Self-pay | Admitting: Cardiology

## 2021-03-15 ENCOUNTER — Other Ambulatory Visit: Payer: Self-pay | Admitting: Cardiology

## 2021-03-15 ENCOUNTER — Other Ambulatory Visit: Payer: Self-pay

## 2021-03-15 ENCOUNTER — Ambulatory Visit (INDEPENDENT_AMBULATORY_CARE_PROVIDER_SITE_OTHER): Payer: Medicare Other | Admitting: *Deleted

## 2021-03-15 DIAGNOSIS — Z5181 Encounter for therapeutic drug level monitoring: Secondary | ICD-10-CM

## 2021-03-15 LAB — POCT INR: INR: 2.8 (ref 2.0–3.0)

## 2021-03-15 NOTE — Patient Instructions (Signed)
Description   Continue taking Warfarin 1 tablet daily except 1.5 tablets on Tuesdays, Thursdays, and Saturdays. Repeat INR in 4 weeks. Call us with any medication changes or concerns # [phone] [facility], Main # [phone], Fax [phone] or [phone]

## 2021-04-13 ENCOUNTER — Other Ambulatory Visit: Payer: Self-pay

## 2021-04-13 ENCOUNTER — Ambulatory Visit (INDEPENDENT_AMBULATORY_CARE_PROVIDER_SITE_OTHER): Payer: Medicare Other | Admitting: *Deleted

## 2021-04-13 DIAGNOSIS — Z5181 Encounter for therapeutic drug level monitoring: Secondary | ICD-10-CM | POA: Diagnosis not present

## 2021-04-13 LAB — POCT INR: INR: 2.7 (ref 2.0–3.0)

## 2021-04-13 NOTE — Patient Instructions (Signed)
Description   ?Continue taking Warfarin 1 tablet daily except 1.5 tablets on Tuesdays, Thursdays, and Saturdays. Repeat INR in 4 weeks (prefers 4 weeks). Call us with any medication changes or concerns # [phone] [facility], Main # [phone], Fax [phone] or [phone] ?  ?  ?

## 2021-04-18 ENCOUNTER — Other Ambulatory Visit: Payer: BC Managed Care – PPO

## 2021-04-18 ENCOUNTER — Other Ambulatory Visit: Payer: Self-pay

## 2021-04-18 DIAGNOSIS — R972 Elevated prostate specific antigen [PSA]: Secondary | ICD-10-CM

## 2021-04-19 LAB — PSA: Prostate Specific Ag, Serum: 8.3 ng/mL — ABNORMAL HIGH (ref 0.0–4.0)

## 2021-04-25 ENCOUNTER — Other Ambulatory Visit: Payer: Self-pay

## 2021-04-25 ENCOUNTER — Ambulatory Visit (INDEPENDENT_AMBULATORY_CARE_PROVIDER_SITE_OTHER): Payer: Medicare Other | Admitting: Urology

## 2021-04-25 ENCOUNTER — Encounter: Payer: Self-pay | Admitting: Urology

## 2021-04-25 VITALS — BP 127/69 | HR 69

## 2021-04-25 DIAGNOSIS — R972 Elevated prostate specific antigen [PSA]: Secondary | ICD-10-CM | POA: Diagnosis not present

## 2021-04-25 LAB — URINALYSIS, ROUTINE W REFLEX MICROSCOPIC
Bilirubin, UA: NEGATIVE
Glucose, UA: NEGATIVE
Ketones, UA: NEGATIVE
Leukocytes,UA: NEGATIVE
Nitrite, UA: NEGATIVE
Protein,UA: NEGATIVE
RBC, UA: NEGATIVE
Specific Gravity, UA: 1.025 (ref 1.005–1.030)
Urobilinogen, Ur: 0.2 mg/dL (ref 0.2–1.0)
pH, UA: 5.5 (ref 5.0–7.5)

## 2021-04-25 LAB — BLADDER SCAN AMB NON-IMAGING: Scan Result: 33

## 2021-04-25 NOTE — Progress Notes (Signed)
post void residual=56m ?

## 2021-04-25 NOTE — Patient Instructions (Signed)
Prostate-Specific Antigen Test ?Why am I having this test? ?The prostate-specific antigen (PSA) test is a screening test for prostate cancer. It can identify early signs of prostate cancer, which may allow for early detection and more effective treatment. Your health care provider may recommend that you have a PSA test starting at age 70 or that you have one earlier if you are at higher risk for prostate cancer. You may also have a PSA test: ?To monitor treatment of prostate cancer. ?To check whether prostate cancer has returned after treatment. ?What is being tested? ?This test measures the amount of PSA in your blood. PSA is a protein that is made in the prostate. The prostate naturally produces more PSA as you age, but very high levels may be a sign of a medical condition. ?What kind of sample is taken? ?A blood sample is required for this test. It is usually collected by inserting a needle into a blood vessel but can also be collected by sticking a finger with a small needle. Blood for this test should be drawn before having an exam of the prostate that involves digital rectal examination to avoid affecting the results. ?How do I prepare for this test? ?Do not ejaculate starting 24 hours before your test, or as long as told by your health care provider, as this can cause an elevation in PSA. ?Do not undergo any procedures that require manipulation of the prostate, such as biopsy or surgery, for 6 weeks before the test is done as this can cause an elevation in PSA. ?Tell a health care provider about: ?Any signs you may have of other conditions that can affect PSA levels, such as: ?An enlarged prostate that is not caused by cancer (benign prostatic hyperplasia, or BPH). This condition is very common in older men. ?A prostate or urinary tract infection. ?Any allergies you have. ?All medicines you are taking, including vitamins, herbs, eye drops, creams, and over-the-counter medicines. This also includes: ?Medicines  to assist with hair growth, such as finasteride. ?Any recent exposure to a medicine called diethylstilbestrol (DES). ?Medicines such as male hormones (like testosterone) or other medicines that raise testosterone levels. ?Any bleeding problems you have. ?Any recent procedures you have had, especially any procedures involving the prostate or rectum. ?Any medical conditions you have. ?How are the results reported? ?Your test results will be reported as a value that indicates how much PSA is in your blood. This will be given as nanograms of PSA per milliliter of blood (ng/mL). Your health care provider will compare your results to normal ranges that were established after testing a large group of people (reference ranges). Reference ranges may vary among labs and hospitals. ?PSA levels vary from person to person and generally increase with age. Because of this variation, there is no single PSA value that is considered normal for everyone. Instead, PSA reference ranges are used to describe whether your PSA levels are considered low or high (elevated). Common reference ranges are: ?Low: 0-2.5 ng/mL. ?Slightly to moderately elevated: 2.6-10.0 ng/mL. ?Moderately elevated: 10.0-19.9 ng/mL. ?Significantly elevated: 20 ng/mL or greater. ?What do the results mean? ?A test result that is higher than 4 ng/mL may mean that you have prostate cancer. However, a PSA test by itself is not enough to diagnose prostate cancer. High PSA levels may also be caused by the natural aging process, prostate infection (prostatitis), or BPH. ?PSA screening cannot tell you if your PSA is high due to cancer or a different cause. ?A prostate biopsy  is the only way to diagnose prostate cancer. ?A risk of having the PSA test is diagnosing and treating prostate cancer that would never have caused any symptoms or problems (overdiagnosis and overtreatment). ?Talk with your health care provider about what your results mean. In some cases, your health care  provider may do more testing to confirm the results. ?Questions to ask your health care provider ?Ask your health care provider, or the department that is doing the test: ?When will my results be ready? ?How will I get my results? ?What are my treatment options? ?What other tests do I need? ?What are my next steps? ?Summary ?The prostate-specific antigen (PSA) test is a screening test for prostate cancer. ?Your health care provider may recommend that you have a PSA test starting at age 45 or that you have one earlier if you are at higher risk for prostate cancer. ?A test result that is higher than 4 ng/mL may mean that you have prostate cancer. However, elevated levels can be caused by a number of conditions other than prostate cancer. ?Talk with your health care provider about what your results mean. ?This information is not intended to replace advice given to you by your health care provider. Make sure you discuss any questions you have with your health care provider. ?Document Revised: 06/07/2020 Document Reviewed: 06/07/2020 ?Elsevier Patient Education ? [place]. ? ?

## 2021-04-25 NOTE — Progress Notes (Signed)
? ?04/25/2021 ?10:17 AM  ? ?Nathan Frye ?[birth year] ?[id number] ? ?Referring provider: Margarito Courser, MD ?[address] ?[place],  [place] [address] ? ?Followup elevated PSA ? ? ?HPI: ?[name] is a 71yo here for followup for elevated PSA. PSA increased to 8.3 from 8.0 six months ago. IPSS 9 QOl 2. Urine stream strong. No straining to urinate. Nocturia 1. No other complaints today ? ? ?PMH: ?Past Medical History:  ?Diagnosis Date  ? Chronic anticoagulation   ? HTN (hypertension)   ? Hypercholesteremia   ? Severe aortic stenosis   ? ? ?Surgical History: ?Past Surgical History:  ?Procedure Laterality Date  ? AORTIC VALVE REPLACEMENT  2007  ? #21 MM [facility] VALVE  ? VASECTOMY    ? ? ?Home Medications:  ?Allergies as of 04/25/2021   ?No Known Allergies ?  ? ?  ?Medication List  ?  ? ?  ? Accurate as of April 25, 2021 10:17 AM. If you have any questions, ask your nurse or doctor.  ?  ?  ? ?  ? ?Ergocalciferol 50 MCG (2000 UT) Tabs ?Take 2,000 Units by mouth daily. ?  ?ferrous sulfate 325 (65 FE) MG EC tablet ?Take 1 tablet by mouth daily. ?  ?hydrochlorothiazide 25 MG tablet ?Commonly known as: HYDRODIURIL ?TAKE 1 TABLET (25 MG TOTAL) BY MOUTH DAILY. ?  ?latanoprost 0.005 % ophthalmic solution ?Commonly known as: XALATAN ?1 drop at bedtime. ?  ?losartan 100 MG tablet ?Commonly known as: COZAAR ?TAKE 1 TABLET BY MOUTH EVERY DAY ?  ?metoprolol succinate 25 MG 24 hr tablet ?Commonly known as: TOPROL-XL ?Take by mouth. ?  ?metoprolol tartrate 25 MG tablet ?Commonly known as: LOPRESSOR ?TAKE 1 TABLET BY MOUTH EVERY DAY ?  ?rosuvastatin 10 MG tablet ?Commonly known as: CRESTOR ?TAKE 1 TABLET BY MOUTH EVERY DAY ?  ?warfarin 5 MG tablet ?Commonly known as: COUMADIN ?Take as directed by the anticoagulation clinic. If you are unsure how to take this medication, talk to your nurse or doctor. ?Original instructions: TAKE 1 TO 1 AND 1/2 TABLETS DAILY AS DIRECTED BY COUMADIN CLINIC ?  ? ?  ? ? ?Allergies: No Known  Allergies ? ?Family History: ?Family History  ?Problem Relation Age of Onset  ? Hypertension Mother   ? ? ?Social History:  reports that he has never smoked. He has never used smokeless tobacco. He reports that he does not drink alcohol and does not use drugs. ? ?ROS: ?All other review of systems were reviewed and are negative except what is noted above in HPI ? ?Physical Exam: ?BP 127/69   Pulse 69   ?Constitutional:  Alert and oriented, No acute distress. ?HEENT: [place] AT, moist mucus membranes.  Trachea midline, no masses. ?Cardiovascular: No clubbing, cyanosis, or edema. ?Respiratory: Normal respiratory effort, no increased work of breathing. ?GI: Abdomen is soft, nontender, nondistended, no abdominal masses ?GU: No CVA tenderness.  ?Lymph: No cervical or inguinal lymphadenopathy. ?Skin: No rashes, bruises or suspicious lesions. ?Neurologic: Grossly intact, no focal deficits, moving all 4 extremities. ?Psychiatric: Normal mood and affect. ? ?Laboratory Data: ?Lab Results  ?Component Value Date  ? WBC 9.5 10/02/2018  ? HGB 10.7 (L) 10/02/2018  ? HCT 33.2 (L) 10/02/2018  ? MCV 90.5 10/02/2018  ? PLT 150 10/02/2018  ? ? ?Lab Results  ?Component Value Date  ? CREATININE 2.29 (H) 10/02/2018  ? ? ?No results found for: PSA ? ?No results found for: TESTOSTERONE ? ?No results found for: HGBA1C ? ?Urinalysis ?   ?  Component Value Date/Time  ? COLORURINE AMBER (A) 10/02/2018 2301  ? APPEARANCEUR Clear 04/18/2020 1416  ? LABSPEC 1.019 10/02/2018 2301  ? PHURINE 5.0 10/02/2018 2301  ? GLUCOSEU Negative 04/18/2020 1416  ? HGBUR LARGE (A) 10/02/2018 2301  ? BILIRUBINUR Negative 04/18/2020 1416  ? KETONESUR 5 (A) 10/02/2018 2301  ? PROTEINUR Negative 04/18/2020 1416  ? PROTEINUR 100 (A) 10/02/2018 2301  ? NITRITE Negative 04/18/2020 1416  ? NITRITE NEGATIVE 10/02/2018 2301  ? LEUKOCYTESUR Negative 04/18/2020 1416  ? LEUKOCYTESUR LARGE (A) 10/02/2018 2301  ? ? ?Lab Results  ?Component Value Date  ? LABMICR See below: 04/18/2020  ?  [place] None seen 04/18/2020  ? LABEPIT None seen 04/18/2020  ? BACTERIA None seen 04/18/2020  ? ? ?Pertinent Imaging: ? ?No results found for this or any previous visit. ? ?No results found for this or any previous visit. ? ?No results found for this or any previous visit. ? ?No results found for this or any previous visit. ? ?No results found for this or any previous visit. ? ?No results found for this or any previous visit. ? ?No results found for this or any previous visit. ? ?No results found for this or any previous visit. ? ? ?Assessment & Plan:   ? ?1. Elevated PSA ?-RTC 6 months with a PSA ?- Urinalysis, Routine w reflex microscopic ?- BLADDER SCAN AMB NON-IMAGING ? ? ?No follow-ups on file. ? ?Nicolette Bang, MD ? ?[facility] ? ? ?

## 2021-04-28 ENCOUNTER — Other Ambulatory Visit: Payer: Self-pay | Admitting: Cardiology

## 2021-05-11 ENCOUNTER — Ambulatory Visit (INDEPENDENT_AMBULATORY_CARE_PROVIDER_SITE_OTHER): Payer: Medicare Other | Admitting: *Deleted

## 2021-05-11 DIAGNOSIS — Z5181 Encounter for therapeutic drug level monitoring: Secondary | ICD-10-CM

## 2021-05-11 LAB — POCT INR: INR: 3.6 — AB (ref 2.0–3.0)

## 2021-05-11 NOTE — Patient Instructions (Signed)
Description   ?Today take 1/2 tablet then continue taking Warfarin 1 tablet daily except 1.5 tablets on Tuesdays, Thursdays, and Saturdays. Repeat INR in 4 weeks (prefers 4 weeks). Call us with any medication changes or concerns # [phone] [facility], Main # [phone], Fax [phone] or [phone] ?  ?  ?

## 2021-06-08 ENCOUNTER — Ambulatory Visit (INDEPENDENT_AMBULATORY_CARE_PROVIDER_SITE_OTHER): Payer: Medicare Other | Admitting: *Deleted

## 2021-06-08 DIAGNOSIS — Z5181 Encounter for therapeutic drug level monitoring: Secondary | ICD-10-CM | POA: Diagnosis not present

## 2021-06-08 LAB — POCT INR: INR: 3.3 — AB (ref 2.0–3.0)

## 2021-06-08 NOTE — Patient Instructions (Signed)
Description   ?Continue taking Warfarin 1 tablet daily except 1.5 tablets on Tuesdays, Thursdays, and Saturdays. Repeat INR in 4 weeks (prefers 4 weeks). Call us with any medication changes or concerns # [phone] [facility], Main # [phone], Fax [phone] or [phone] ?  ?  ?

## 2021-07-06 ENCOUNTER — Ambulatory Visit (INDEPENDENT_AMBULATORY_CARE_PROVIDER_SITE_OTHER): Payer: Medicare Other | Admitting: *Deleted

## 2021-07-06 DIAGNOSIS — Z5181 Encounter for therapeutic drug level monitoring: Secondary | ICD-10-CM

## 2021-07-06 LAB — POCT INR: INR: 3.4 — AB (ref 2.0–3.0)

## 2021-07-06 NOTE — Patient Instructions (Signed)
Description   Continue taking Warfarin 1 tablet daily except 1.5 tablets on Tuesdays, Thursdays, and Saturdays. Repeat INR in 4 weeks. Call us with any medication changes or concerns # [phone] [facility], Main # [phone], Fax [phone] or [phone]

## 2021-08-03 ENCOUNTER — Ambulatory Visit (INDEPENDENT_AMBULATORY_CARE_PROVIDER_SITE_OTHER): Payer: Medicare Other

## 2021-08-03 DIAGNOSIS — Z5181 Encounter for therapeutic drug level monitoring: Secondary | ICD-10-CM

## 2021-08-03 LAB — POCT INR: INR: 2.7 (ref 2.0–3.0)

## 2021-08-19 ENCOUNTER — Other Ambulatory Visit: Payer: Self-pay | Admitting: Cardiology

## 2021-09-10 ENCOUNTER — Ambulatory Visit (INDEPENDENT_AMBULATORY_CARE_PROVIDER_SITE_OTHER): Payer: Medicare Other

## 2021-09-10 DIAGNOSIS — Z5181 Encounter for therapeutic drug level monitoring: Secondary | ICD-10-CM | POA: Diagnosis not present

## 2021-09-10 DIAGNOSIS — Z7901 Long term (current) use of anticoagulants: Secondary | ICD-10-CM

## 2021-09-10 LAB — POCT INR: INR: 3.2 — AB (ref 2.0–3.0)

## 2021-09-10 NOTE — Patient Instructions (Signed)
Description   Continue taking Warfarin 1 tablet daily except 1.5 tablets on Tuesdays, Thursdays, and Saturdays. Repeat INR in 6 weeks. Call us with any medication changes or concerns # [phone] [facility], Main # [phone], Fax #[phone] or [phone]     

## 2021-10-12 ENCOUNTER — Other Ambulatory Visit: Payer: Medicare Other

## 2021-10-19 ENCOUNTER — Ambulatory Visit: Payer: Medicare Other | Admitting: Urology

## 2021-10-20 ENCOUNTER — Other Ambulatory Visit: Payer: Self-pay | Admitting: Cardiology

## 2021-10-23 ENCOUNTER — Ambulatory Visit: Payer: Medicare Other | Attending: Cardiology

## 2021-10-23 DIAGNOSIS — Z5181 Encounter for therapeutic drug level monitoring: Secondary | ICD-10-CM | POA: Diagnosis not present

## 2021-10-23 LAB — POCT INR: INR: 3.5 — AB (ref 2.0–3.0)

## 2021-10-23 NOTE — Patient Instructions (Signed)
Continue taking Warfarin 1 tablet daily except 1.5 tablets on Tuesdays, Thursdays, and Saturdays. Repeat INR in 6 weeks. Call us with any medication changes or concerns # [phone] [facility], Main # [phone], Fax #[phone] or [phone] 

## 2021-10-29 ENCOUNTER — Other Ambulatory Visit: Payer: Medicare Other

## 2021-10-31 ENCOUNTER — Other Ambulatory Visit: Payer: Medicare Other

## 2021-10-31 ENCOUNTER — Ambulatory Visit: Payer: Medicare Other | Admitting: Urology

## 2021-10-31 DIAGNOSIS — R972 Elevated prostate specific antigen [PSA]: Secondary | ICD-10-CM

## 2021-11-01 LAB — PSA, TOTAL AND FREE
PSA, Free Pct: 24.8 %
PSA, Free: 2.26 ng/mL
Prostate Specific Ag, Serum: 9.1 ng/mL — ABNORMAL HIGH (ref 0.0–4.0)

## 2021-11-02 ENCOUNTER — Ambulatory Visit: Payer: Medicare Other | Admitting: Urology

## 2021-11-06 ENCOUNTER — Encounter: Payer: Self-pay | Admitting: Urology

## 2021-11-06 ENCOUNTER — Ambulatory Visit (INDEPENDENT_AMBULATORY_CARE_PROVIDER_SITE_OTHER): Payer: Medicare Other | Admitting: Urology

## 2021-11-06 VITALS — BP 147/72 | HR 62

## 2021-11-06 DIAGNOSIS — R972 Elevated prostate specific antigen [PSA]: Secondary | ICD-10-CM

## 2021-11-06 DIAGNOSIS — N401 Enlarged prostate with lower urinary tract symptoms: Secondary | ICD-10-CM | POA: Diagnosis not present

## 2021-11-06 DIAGNOSIS — R3915 Urgency of urination: Secondary | ICD-10-CM | POA: Diagnosis not present

## 2021-11-06 DIAGNOSIS — N3000 Acute cystitis without hematuria: Secondary | ICD-10-CM

## 2021-11-06 MED ORDER — AMOXICILLIN-POT CLAVULANATE 875-125 MG PO TABS: 1.0000 | ORAL_TABLET | Freq: Two times a day (BID) | ORAL | 0 refills | Status: DC

## 2021-11-06 NOTE — Patient Instructions (Signed)

## 2021-11-06 NOTE — Progress Notes (Signed)
11/06/2021 8:44 AM   [name] 1950/11/05 [id number]  Referring provider: Margarito Courser, MD [address] [place],  [place] [address]  Followup elevated PSA and urinary urgency   HPI: Mr Mullarkey is a 71yo here for followup for elevated PSA and BPH with urinary urgency. PSA increased to 9.0 from 8.3. IPSS 15 QOL 4 on no BPH therapy. He notes a discharge 2-3 days after ejaculation. He has urinary frequency every 1-2 hours. No dysuria. Nocturia 2x. He notes his LUTS worsen with drinking bottled water.     PMH: Past Medical History:  Diagnosis Date   Chronic anticoagulation    HTN (hypertension)    Hypercholesteremia    Severe aortic stenosis     Surgical History: Past Surgical History:  Procedure Laterality Date   AORTIC VALVE REPLACEMENT  2007   #21 MM [facility] VALVE   VASECTOMY      Home Medications:  Allergies as of 11/06/2021   No Known Allergies      Medication List        Accurate as of November 06, 2021  8:44 AM. If you have any questions, ask your nurse or doctor.          Ergocalciferol 50 MCG (2000 UT) Tabs Take 2,000 Units by mouth daily.   ferrous sulfate 325 (65 FE) MG EC tablet Take 1 tablet by mouth daily.   hydrochlorothiazide 25 MG tablet Commonly known as: HYDRODIURIL TAKE 1 TABLET (25 MG TOTAL) BY MOUTH DAILY.   latanoprost 0.005 % ophthalmic solution Commonly known as: XALATAN 1 drop at bedtime.   losartan 100 MG tablet Commonly known as: COZAAR TAKE 1 TABLET BY MOUTH EVERY DAY   metoprolol succinate 25 MG 24 hr tablet Commonly known as: TOPROL-XL Take by mouth.   metoprolol tartrate 25 MG tablet Commonly known as: LOPRESSOR TAKE 1 TABLET BY MOUTH EVERY DAY   rosuvastatin 10 MG tablet Commonly known as: CRESTOR TAKE 1 TABLET BY MOUTH EVERY DAY   warfarin 5 MG tablet Commonly known as: COUMADIN Take as directed by the anticoagulation clinic. If you are unsure how to take this medication, talk to your  nurse or doctor. Original instructions: TAKE 1 TO 1 AND 1/2 TABLETS DAILY AS DIRECTED BY COUMADIN CLINIC        Allergies: No Known Allergies  Family History: Family History  Problem Relation Age of Onset   Hypertension Mother     Social History:  reports that he has never smoked. He has never used smokeless tobacco. He reports that he does not drink alcohol and does not use drugs.  ROS: All other review of systems were reviewed and are negative except what is noted above in HPI  Physical Exam: There were no vitals taken for this visit.  Constitutional:  Alert and oriented, No acute distress. HEENT: [place] AT, moist mucus membranes.  Trachea midline, no masses. Cardiovascular: No clubbing, cyanosis, or edema. Respiratory: Normal respiratory effort, no increased work of breathing. GI: Abdomen is soft, nontender, nondistended, no abdominal masses GU: No CVA tenderness.  Lymph: No cervical or inguinal lymphadenopathy. Skin: No rashes, bruises or suspicious lesions. Neurologic: Grossly intact, no focal deficits, moving all 4 extremities. Psychiatric: Normal mood and affect.  Laboratory Data: Lab Results  Component Value Date   WBC 9.5 10/02/2018   HGB 10.7 (L) 10/02/2018   HCT 33.2 (L) 10/02/2018   MCV 90.5 10/02/2018   PLT 150 10/02/2018    Lab Results  Component Value Date  CREATININE 2.29 (H) 10/02/2018    No results found for: "PSA"  No results found for: "TESTOSTERONE"  No results found for: "HGBA1C"  Urinalysis    Component Value Date/Time   COLORURINE AMBER (A) 10/02/2018 2301   APPEARANCEUR Clear 04/25/2021 1103   LABSPEC 1.019 10/02/2018 2301   PHURINE 5.0 10/02/2018 2301   GLUCOSEU Negative 04/25/2021 1103   HGBUR LARGE (A) 10/02/2018 2301   BILIRUBINUR Negative 04/25/2021 1103   KETONESUR 5 (A) 10/02/2018 2301   PROTEINUR Negative 04/25/2021 1103   PROTEINUR 100 (A) 10/02/2018 2301   NITRITE Negative 04/25/2021 1103   NITRITE NEGATIVE 10/02/2018  2301   LEUKOCYTESUR Negative 04/25/2021 1103   LEUKOCYTESUR LARGE (A) 10/02/2018 2301    Lab Results  Component Value Date   LABMICR Comment 04/25/2021   WBCUA None seen 04/18/2020   LABEPIT None seen 04/18/2020   BACTERIA None seen 04/18/2020    Pertinent Imaging:  No results found for this or any previous visit.  No results found for this or any previous visit.  No results found for this or any previous visit.  No results found for this or any previous visit.  No results found for this or any previous visit.  No valid procedures specified. No results found for this or any previous visit.  No results found for this or any previous visit.   Assessment & Plan:    1. Elevated PSA -MRi prostate  2. Urinary urgency -we will treat for prostatitis. Augmentin BID for 21 days   No follow-ups on file.  Nicolette Bang, MD  [facility] [place]

## 2021-11-07 LAB — MICROSCOPIC EXAMINATION
Bacteria, UA: NONE SEEN
RBC, Urine: >30 /hpf — AB (ref 0–2)

## 2021-11-07 LAB — URINALYSIS, ROUTINE W REFLEX MICROSCOPIC
Bilirubin, UA: NEGATIVE
Glucose, UA: NEGATIVE
Ketones, UA: NEGATIVE
Leukocytes,UA: NEGATIVE
Nitrite, UA: NEGATIVE
Specific Gravity, UA: 1.015 (ref 1.005–1.030)
Urobilinogen, Ur: 0.2 mg/dL (ref 0.2–1.0)
pH, UA: 7.5 (ref 5.0–7.5)

## 2021-11-08 LAB — URINE CULTURE: Organism ID, Bacteria: NO GROWTH

## 2021-12-03 NOTE — Progress Notes (Signed)
Nathan Frye Date of Birth: [birth year]   History of Present Illness: Nathan Frye is seen today for followup of his aortic valve disease. 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 Last Echo in July 2017 showed normal valve function.   On follow up today he is doing very well. He denies any  symptoms of chest pain, shortness of breath, or palpitations. He continues to exercise regularly 5 days a week. He is retired but helping out some. He is being evaluated by urology for elevated PSA. Biopsy in 2021 was OK. Had MRI this past week. Being treated by PCP for anemia. Does not monitor BP but last 2 visits with PCP BP looked very good.    Current Outpatient Medications on File Prior to Visit  Medication Sig Dispense Refill   Ergocalciferol 2000 UNITS TABS Take 2,000 Units by mouth daily.     ferrous sulfate 325 (65 FE) MG EC tablet Take 1 tablet by mouth daily.     latanoprost (XALATAN) 0.005 % ophthalmic solution 1 drop at bedtime.     rosuvastatin (CRESTOR) 10 MG tablet TAKE 1 TABLET BY MOUTH EVERY DAY 90 tablet 2   warfarin (COUMADIN) 5 MG tablet TAKE 1 TO 1 AND 1/2 TABLETS DAILY AS DIRECTED BY COUMADIN CLINIC 135 tablet 1   amoxicillin-clavulanate (AUGMENTIN) 875-125 MG tablet Take 1 tablet by mouth every 12 (twelve) hours. (Patient not taking: Reported on 12/17/2021) 42 tablet 0   No current facility-administered medications on file prior to visit.    No Known Allergies  Past Medical History:  Diagnosis Date   Chronic anticoagulation    HTN (hypertension)    Hypercholesteremia    Severe aortic stenosis     Past Surgical History:  Procedure Laterality Date   AORTIC VALVE REPLACEMENT  2007   #21 MM [facility] VALVE   VASECTOMY      Social History   Tobacco Use  Smoking Status Never  Smokeless Tobacco Never    Social History   Substance and Sexual Activity  Alcohol Use No   Alcohol/week: 0.0 standard drinks of alcohol     Family History  Problem Relation Age of Onset   Hypertension Mother     Review of Systems: As noted history of present illness.  All other systems were reviewed and are negative.  Physical Exam: BP (!) 154/80 (BP Location: Left Arm, Cuff Size: Normal)   Pulse 61   Ht '5\' 9"'$  (1.753 m)   Wt 165 lb 6.4 oz (75 kg)   SpO2 98%   BMI 24.43 kg/m  GENERAL:  Well appearing BM in NAD HEENT:  PERRL, EOMI, sclera are clear. Oropharynx is clear. NECK:  No jugular venous distention, carotid upstroke brisk and symmetric, no bruits, no thyromegaly or adenopathy LUNGS:  Clear to auscultation bilaterally CHEST:  Unremarkable HEART:  RRR,  PMI not displaced or sustained,S1 and S2 within normal limits, no S3, no S4: good mechanical AV click. Soft 1/6 systolic murmur at the apex. ABD:  Soft, nontender. BS +, no masses or bruits. No hepatomegaly, no splenomegaly EXT:  2 + pulses throughout, no edema, no cyanosis no clubbing SKIN:  Warm and dry.  No rashes NEURO:  Alert and oriented x 3. Cranial nerves II through XII intact. PSYCH:  Cognitively intact   HYPERTENSION CONTROL Vitals:   12/17/21 0741 12/17/21 0752  BP: (!) 148/74 (!) 154/80    The patient's blood pressure is elevated above target today.  In order to address the patient's elevated BP: Blood pressure will be monitored at home to determine if medication changes need to be made.; Follow up with primary care provider for management.      LABORATORY DATA: Lab Results  Component Value Date   WBC 9.5 10/02/2018   HGB 10.7 (L) 10/02/2018   HCT 33.2 (L) 10/02/2018   PLT 150 10/02/2018   GLUCOSE 127 (H) 10/02/2018   CHOL 163 05/02/2011   TRIG 75.0 05/02/2011   HDL 67.00 05/02/2011   LDLCALC 81 05/02/2011   ALT 24 10/02/2018   AST 38 10/02/2018   NA 135 10/02/2018   K 4.1 10/02/2018   CL 100 10/02/2018   CREATININE 2.29 (H) 10/02/2018   BUN 34 (H) 10/02/2018   CO2 25 10/02/2018   INR 3.0 12/04/2021     LABS reviewed  from 04/27/15. Hgb 13.1. CMET normal, Cholesterol 167, Triglycerides 80, HDL 80, LDL 71. TSH normal. From 06/19/16: cholesterol 165, triglycerides 53. HDL 77, LDL 77. Hgb 13.2. CMET and TSH normal. Dated 09/16/17: Hgb 12.7. CMET normal. Cholesterol 175, triglycerides 64, HDL 79, LDL 83. TSH normal.  Dated 01/06/19: creatinine 1.25. otherwise CMET normal Dated 07/29/19: cholesterol 172, triglycerides 56, HDL 68, LDL 93. Hgb 12.2. Dated 01/31/20: cholesterol 170, triglycerides 71, HDL 68, LDL 88. Creatinine 1.2 otherwise CMET normal. Dated 09/17/21: Hgb 10.3, BUN 10, creatinine 1.31, otherwise BMET normal. PSA 9.2. cholesterol 160, triglycerides 52, HDL 63, LDL 86   Ecg today shows NSR rate 61.  LVH.  I have personally reviewed and interpreted this study.   Echo: 09/11/15: Study Conclusions   - Left ventricle: The cavity size was normal. Wall thickness was   normal. Systolic function was normal. The estimated ejection   fraction was in the range of 55% to 60%. Wall motion was normal;   there were no regional wall motion abnormalities. Left   ventricular diastolic function parameters were normal. - Aortic valve: A mechanical prosthesis was present and functioning   normally. There was trivial regurgitation. Valve area (VTI): 1.58   cm^2. Valve area (Vmax): 1.62 cm^2. Valve area (Vmean): 1.72   cm^2. - Mitral valve: There was mild regurgitation.    Assessment / Plan:  1. Status post mechanical aortic valve replacement 2007. Satisfactory Echo 2017.  Clinically stable. Continue chronic anticoagulation.  Routine SBE prophylaxis. I will followup again in one year  2. Hypertension, BP is elevated today but has been controlled on PCP visits. I asked that he monitor at home and if it stays elevated may need to consider add on therapy such as amlodipine. . Continue Hyzaar and metoprolol.   3. Hypercholesterolemia-on Crestor. good control.  4. Anemia - evaluation per PCP  5. Elevated PSA. Follow up  with urology

## 2021-12-04 ENCOUNTER — Ambulatory Visit: Payer: Medicare Other | Attending: Cardiology | Admitting: *Deleted

## 2021-12-04 DIAGNOSIS — Z5181 Encounter for therapeutic drug level monitoring: Secondary | ICD-10-CM | POA: Diagnosis not present

## 2021-12-04 LAB — POCT INR: INR: 3 (ref 2.0–3.0)

## 2021-12-04 NOTE — Patient Instructions (Signed)
Description   Continue taking Warfarin 1 tablet daily except 1.5 tablets on Tuesdays, Thursdays, and Saturdays. Repeat INR in 6 weeks. Call us with any medication changes or concerns # [phone] [facility], Main # [phone], Fax #[phone] or [phone]     

## 2021-12-15 ENCOUNTER — Encounter: Payer: Self-pay | Admitting: Urology

## 2021-12-15 ENCOUNTER — Encounter (HOSPITAL_COMMUNITY): Payer: Self-pay | Admitting: Urology

## 2021-12-15 ENCOUNTER — Ambulatory Visit (HOSPITAL_COMMUNITY)
Admission: RE | Admit: 2021-12-15 | Discharge: 2021-12-15 | Disposition: A | Discharge status: Home / Self Care | Payer: Medicare Other | Source: Ambulatory Visit | Attending: Urology | Admitting: Urology

## 2021-12-15 DIAGNOSIS — R972 Elevated prostate specific antigen [PSA]: Secondary | ICD-10-CM | POA: Diagnosis present

## 2021-12-15 MED ORDER — GADOBUTROL 1 MMOL/ML IV SOLN
7.5000 mL | Freq: Once | INTRAVENOUS | Status: AC | PRN
  Administered 2021-12-15: 7.5 mL via INTRAVENOUS

## 2021-12-17 ENCOUNTER — Encounter: Payer: Self-pay | Admitting: Cardiology

## 2021-12-17 ENCOUNTER — Ambulatory Visit: Payer: Medicare Other | Attending: Cardiology | Admitting: Cardiology

## 2021-12-17 VITALS — BP 154/80 | HR 61 | Ht 69.0 in | Wt 165.4 lb

## 2021-12-17 DIAGNOSIS — E7800 Pure hypercholesterolemia, unspecified: Secondary | ICD-10-CM | POA: Diagnosis not present

## 2021-12-17 DIAGNOSIS — Z7901 Long term (current) use of anticoagulants: Secondary | ICD-10-CM | POA: Diagnosis not present

## 2021-12-17 DIAGNOSIS — Z952 Presence of prosthetic heart valve: Secondary | ICD-10-CM | POA: Insufficient documentation

## 2021-12-17 DIAGNOSIS — I10 Essential (primary) hypertension: Secondary | ICD-10-CM | POA: Diagnosis not present

## 2021-12-17 MED ORDER — LOSARTAN POTASSIUM 100 MG PO TABS: 100.0000 mg | ORAL_TABLET | Freq: Every day | ORAL | 3 refills | Status: DC

## 2021-12-17 MED ORDER — METOPROLOL SUCCINATE ER 25 MG PO TB24: 25.0000 mg | ORAL_TABLET | Freq: Every day | ORAL | 3 refills | Status: DC

## 2021-12-17 MED ORDER — HYDROCHLOROTHIAZIDE 25 MG PO TABS: 25.0000 mg | ORAL_TABLET | Freq: Every day | ORAL | 3 refills | Status: DC

## 2021-12-18 ENCOUNTER — Ambulatory Visit (INDEPENDENT_AMBULATORY_CARE_PROVIDER_SITE_OTHER): Payer: Medicare Other | Admitting: Urology

## 2021-12-18 ENCOUNTER — Encounter: Payer: Self-pay | Admitting: Urology

## 2021-12-18 VITALS — BP 142/71 | HR 59

## 2021-12-18 DIAGNOSIS — R3915 Urgency of urination: Secondary | ICD-10-CM | POA: Diagnosis not present

## 2021-12-18 DIAGNOSIS — R972 Elevated prostate specific antigen [PSA]: Secondary | ICD-10-CM | POA: Diagnosis not present

## 2021-12-18 DIAGNOSIS — N411 Chronic prostatitis: Secondary | ICD-10-CM

## 2021-12-18 NOTE — Patient Instructions (Signed)

## 2021-12-18 NOTE — Progress Notes (Signed)
12/18/2021 9:16 AM   [name] 09-08-1950 [id number]  Referring provider: Margarito Courser, MD [address] [place],  [place] [address]  Followup chronic prostatitis and elevated PSA   HPI: [name] is a 71yo is a here for followup for chronic prostatitis and elevated PSA. His discharge has improve since finishing augmentin. MRI of the prostate from 11/4 shows no evidence of high grade prostate cancer. No other complaints today   PMH: Past Medical History:  Diagnosis Date   Chronic anticoagulation    HTN (hypertension)    Hypercholesteremia    Severe aortic stenosis     Surgical History: Past Surgical History:  Procedure Laterality Date   AORTIC VALVE REPLACEMENT  2007   #21 MM [facility] VALVE   VASECTOMY      Home Medications:  Allergies as of 12/18/2021   No Known Allergies      Medication List        Accurate as of December 18, 2021  9:16 AM. If you have any questions, ask your nurse or doctor.          amoxicillin-clavulanate 875-125 MG tablet Commonly known as: AUGMENTIN Take 1 tablet by mouth every 12 (twelve) hours.   Ergocalciferol 50 MCG (2000 UT) Tabs Take 2,000 Units by mouth daily.   ferrous sulfate 325 (65 FE) MG EC tablet Take 1 tablet by mouth daily.   hydrochlorothiazide 25 MG tablet Commonly known as: HYDRODIURIL Take 1 tablet (25 mg total) by mouth daily.   latanoprost 0.005 % ophthalmic solution Commonly known as: XALATAN 1 drop at bedtime.   losartan 100 MG tablet Commonly known as: COZAAR Take 1 tablet (100 mg total) by mouth daily.   metoprolol succinate 25 MG 24 hr tablet Commonly known as: TOPROL-XL Take 1 tablet (25 mg total) by mouth daily.   rosuvastatin 10 MG tablet Commonly known as: CRESTOR TAKE 1 TABLET BY MOUTH EVERY DAY   warfarin 5 MG tablet Commonly known as: COUMADIN Take as directed by the anticoagulation clinic. If you are unsure how to take this medication, talk to your nurse or  doctor. Original instructions: TAKE 1 TO 1 AND 1/2 TABLETS DAILY AS DIRECTED BY COUMADIN CLINIC        Allergies: No Known Allergies  Family History: Family History  Problem Relation Age of Onset   Hypertension Mother     Social History:  reports that he has never smoked. He has never used smokeless tobacco. He reports that he does not drink alcohol and does not use drugs.  ROS: All other review of systems were reviewed and are negative except what is noted above in HPI  Physical Exam: BP (!) 142/71   Pulse (!) 59   Constitutional:  Alert and oriented, No acute distress. HEENT: [place] AT, moist mucus membranes.  Trachea midline, no masses. Cardiovascular: No clubbing, cyanosis, or edema. Respiratory: Normal respiratory effort, no increased work of breathing. GI: Abdomen is soft, nontender, nondistended, no abdominal masses GU: No CVA tenderness.  Lymph: No cervical or inguinal lymphadenopathy. Skin: No rashes, bruises or suspicious lesions. Neurologic: Grossly intact, no focal deficits, moving all 4 extremities. Psychiatric: Normal mood and affect.  Laboratory Data: Lab Results  Component Value Date   WBC 9.5 10/02/2018   HGB 10.7 (L) 10/02/2018   HCT 33.2 (L) 10/02/2018   MCV 90.5 10/02/2018   PLT 150 10/02/2018    Lab Results  Component Value Date   CREATININE 2.29 (H) 10/02/2018  No results found for: "PSA"  No results found for: "TESTOSTERONE"  No results found for: "HGBA1C"  Urinalysis    Component Value Date/Time   COLORURINE AMBER (A) 10/02/2018 2301   APPEARANCEUR Cloudy (A) 11/06/2021 0854   LABSPEC 1.019 10/02/2018 2301   PHURINE 5.0 10/02/2018 2301   GLUCOSEU Negative 11/06/2021 0854   HGBUR LARGE (A) 10/02/2018 2301   BILIRUBINUR Negative 11/06/2021 0854   KETONESUR 5 (A) 10/02/2018 2301   PROTEINUR Trace (A) 11/06/2021 0854   PROTEINUR 100 (A) 10/02/2018 2301   NITRITE Negative 11/06/2021 0854   NITRITE NEGATIVE 10/02/2018 2301    LEUKOCYTESUR Negative 11/06/2021 0854   LEUKOCYTESUR LARGE (A) 10/02/2018 2301    Lab Results  Component Value Date   LABMICR See below: 11/06/2021   WBCUA 0-5 11/06/2021   LABEPIT 0-10 11/06/2021   BACTERIA None seen 11/06/2021    Pertinent Imaging: MRI 12/18/2021: Images reviewed and discussed with the patient  No results found for this or any previous visit.  No results found for this or any previous visit.  No results found for this or any previous visit.  No results found for this or any previous visit.  No results found for this or any previous visit.  No valid procedures specified. No results found for this or any previous visit.  No results found for this or any previous visit.   Assessment & Plan:    1. Elevated PSA -RTC 6 months with a PSA - Urinalysis, Routine w reflex microscopic  2. Urinary urgency Improved after augmentin  3. Chronic prostatitis without hematuria Improved after augmentin   No follow-ups on file.  Nicolette Bang, MD  [facility] [place]

## 2021-12-19 LAB — URINALYSIS, ROUTINE W REFLEX MICROSCOPIC
Bilirubin, UA: NEGATIVE
Glucose, UA: NEGATIVE
Ketones, UA: NEGATIVE
Leukocytes,UA: NEGATIVE
Nitrite, UA: NEGATIVE
Protein,UA: NEGATIVE
Specific Gravity, UA: 1.02 (ref 1.005–1.030)
Urobilinogen, Ur: 0.2 mg/dL (ref 0.2–1.0)
pH, UA: 6 (ref 5.0–7.5)

## 2021-12-19 LAB — MICROSCOPIC EXAMINATION
Bacteria, UA: NONE SEEN
Epithelial Cells (non renal): NONE SEEN /hpf (ref 0–10)

## 2021-12-19 LAB — PSA: Prostate Specific Ag, Serum: 7.2 ng/mL — ABNORMAL HIGH (ref 0.0–4.0)

## 2022-01-15 ENCOUNTER — Ambulatory Visit: Payer: Medicare Other | Attending: Cardiology

## 2022-01-15 DIAGNOSIS — Z5181 Encounter for therapeutic drug level monitoring: Secondary | ICD-10-CM | POA: Diagnosis not present

## 2022-01-15 LAB — POCT INR: INR: 3.3 — AB (ref 2.0–3.0)

## 2022-01-15 NOTE — Patient Instructions (Signed)
Continue taking Warfarin 1 tablet daily except 1.5 tablets on Tuesdays, Thursdays, and Saturdays. Repeat INR in 6 weeks. Call us with any medication changes or concerns # [phone] [facility], Main # [phone], Fax [phone] or [phone]

## 2022-01-25 ENCOUNTER — Other Ambulatory Visit: Payer: Self-pay | Admitting: Cardiology

## 2022-02-26 ENCOUNTER — Ambulatory Visit: Payer: Medicare Other | Attending: Cardiology

## 2022-02-26 DIAGNOSIS — Z5181 Encounter for therapeutic drug level monitoring: Secondary | ICD-10-CM | POA: Diagnosis not present

## 2022-02-26 LAB — POCT INR: INR: 3 (ref 2.0–3.0)

## 2022-02-26 NOTE — Patient Instructions (Signed)
Continue taking Warfarin 1 tablet daily except 1.5 tablets on Tuesdays, Thursdays, and Saturdays. Repeat INR in 6 weeks. Call us with any medication changes or concerns # [phone] [facility], Main # [phone], Fax [phone] or [phone]

## 2022-04-08 ENCOUNTER — Ambulatory Visit: Payer: Medicare Other | Attending: Cardiology | Admitting: *Deleted

## 2022-04-08 DIAGNOSIS — Z5181 Encounter for therapeutic drug level monitoring: Secondary | ICD-10-CM | POA: Insufficient documentation

## 2022-04-08 LAB — POCT INR: POC INR: 2.7

## 2022-04-08 NOTE — Patient Instructions (Signed)
Description   Continue taking Warfarin 1 tablet daily except 1.5 tablets on Tuesdays, Thursdays, and Saturdays. Repeat INR in 6 weeks. Call us with any medication changes or concerns # [phone] [facility], Main # [phone], Fax [phone] or [phone]

## 2022-04-19 ENCOUNTER — Other Ambulatory Visit: Payer: Self-pay | Admitting: Cardiology

## 2022-04-23 ENCOUNTER — Other Ambulatory Visit: Payer: Self-pay | Admitting: Cardiology

## 2022-05-20 ENCOUNTER — Ambulatory Visit: Payer: Medicare Other | Attending: Internal Medicine

## 2022-05-20 DIAGNOSIS — Z5181 Encounter for therapeutic drug level monitoring: Secondary | ICD-10-CM | POA: Diagnosis present

## 2022-05-20 LAB — POCT INR: INR: 4.2 — AB (ref 2.0–3.0)

## 2022-05-20 NOTE — Patient Instructions (Signed)
Description   Hold today's dose and then continue taking Warfarin 1 tablet daily except 1.5 tablets on Tuesdays, Thursdays, and Saturdays. Repeat INR in 5 weeks. Call us with any medication changes or concerns # [phone] [facility], Main # [phone], Fax #[phone] or [phone]

## 2022-06-03 ENCOUNTER — Telehealth: Payer: Self-pay | Admitting: Cardiology

## 2022-06-03 NOTE — Telephone Encounter (Signed)
Called pt and r/s INR check, he started cipro BID today x 5 days, INR appt moved to this Thursday.

## 2022-06-03 NOTE — Telephone Encounter (Signed)
Pt c/o medication issue:  1. Name of Medication: ciprofloxacin hcl  2. How are you currently taking this medication (dosage and times per day)?   3. Are you having a reaction (difficulty breathing--STAT)? No  4. What is your medication issue? Patient stated he was told to start taking this antibiotic medication, but they informed him at urgent care that this medication does not mix with his Coumadin medication. Patient stated that the urgent care wanted him to have his INR checked by Wednesday or Thursday this week. Please advise.

## 2022-06-04 ENCOUNTER — Encounter (HOSPITAL_COMMUNITY): Payer: Self-pay | Admitting: *Deleted

## 2022-06-04 ENCOUNTER — Inpatient Hospital Stay (HOSPITAL_COMMUNITY)
Admission: EM | Admit: 2022-06-04 | Discharge: 2022-06-10 | DRG: 690 | Disposition: A | Discharge status: Home / Self Care | Payer: Medicare Other | Source: Ambulatory Visit | Attending: Family Medicine | Admitting: Family Medicine

## 2022-06-04 ENCOUNTER — Emergency Department (HOSPITAL_COMMUNITY)
Admission: EM | Admit: 2022-06-04 | Discharge: 2022-06-04 | Disposition: A | Discharge status: Home / Self Care | Payer: Medicare Other | Source: Home / Self Care | Attending: Emergency Medicine | Admitting: Emergency Medicine

## 2022-06-04 ENCOUNTER — Other Ambulatory Visit: Payer: Self-pay

## 2022-06-04 ENCOUNTER — Encounter (HOSPITAL_COMMUNITY): Payer: Self-pay

## 2022-06-04 DIAGNOSIS — N1831 Chronic kidney disease, stage 3a: Secondary | ICD-10-CM | POA: Diagnosis present

## 2022-06-04 DIAGNOSIS — N390 Urinary tract infection, site not specified: Principal | ICD-10-CM | POA: Diagnosis present

## 2022-06-04 DIAGNOSIS — E7800 Pure hypercholesterolemia, unspecified: Secondary | ICD-10-CM | POA: Diagnosis not present

## 2022-06-04 DIAGNOSIS — N3001 Acute cystitis with hematuria: Secondary | ICD-10-CM

## 2022-06-04 DIAGNOSIS — D62 Acute posthemorrhagic anemia: Secondary | ICD-10-CM | POA: Diagnosis not present

## 2022-06-04 DIAGNOSIS — Z7901 Long term (current) use of anticoagulants: Secondary | ICD-10-CM | POA: Insufficient documentation

## 2022-06-04 DIAGNOSIS — D649 Anemia, unspecified: Secondary | ICD-10-CM | POA: Insufficient documentation

## 2022-06-04 DIAGNOSIS — Z952 Presence of prosthetic heart valve: Secondary | ICD-10-CM

## 2022-06-04 DIAGNOSIS — Z79899 Other long term (current) drug therapy: Secondary | ICD-10-CM | POA: Insufficient documentation

## 2022-06-04 DIAGNOSIS — R791 Abnormal coagulation profile: Secondary | ICD-10-CM | POA: Diagnosis present

## 2022-06-04 DIAGNOSIS — I10 Essential (primary) hypertension: Secondary | ICD-10-CM | POA: Insufficient documentation

## 2022-06-04 DIAGNOSIS — R310 Gross hematuria: Principal | ICD-10-CM

## 2022-06-04 DIAGNOSIS — N179 Acute kidney failure, unspecified: Secondary | ICD-10-CM

## 2022-06-04 DIAGNOSIS — N400 Enlarged prostate without lower urinary tract symptoms: Secondary | ICD-10-CM | POA: Diagnosis present

## 2022-06-04 DIAGNOSIS — R339 Retention of urine, unspecified: Secondary | ICD-10-CM

## 2022-06-04 DIAGNOSIS — E876 Hypokalemia: Secondary | ICD-10-CM

## 2022-06-04 DIAGNOSIS — R319 Hematuria, unspecified: Secondary | ICD-10-CM | POA: Diagnosis present

## 2022-06-04 DIAGNOSIS — Z8249 Family history of ischemic heart disease and other diseases of the circulatory system: Secondary | ICD-10-CM

## 2022-06-04 DIAGNOSIS — I129 Hypertensive chronic kidney disease with stage 1 through stage 4 chronic kidney disease, or unspecified chronic kidney disease: Secondary | ICD-10-CM | POA: Diagnosis present

## 2022-06-04 LAB — CBC
HCT: 34.7 % — ABNORMAL LOW (ref 39.0–52.0)
Hemoglobin: 11.2 g/dL — ABNORMAL LOW (ref 13.0–17.0)
MCH: 29 pg (ref 26.0–34.0)
MCHC: 32.3 g/dL (ref 30.0–36.0)
MCV: 89.9 fL (ref 80.0–100.0)
Platelets: 232 10*3/uL (ref 150–400)
RBC: 3.86 MIL/uL — ABNORMAL LOW (ref 4.22–5.81)
RDW: 11.6 % (ref 11.5–15.5)
WBC: 17.3 10*3/uL — ABNORMAL HIGH (ref 4.0–10.5)
nRBC: 0 % (ref 0.0–0.2)

## 2022-06-04 LAB — BASIC METABOLIC PANEL
Anion gap: 13 (ref 5–15)
BUN: 29 mg/dL — ABNORMAL HIGH (ref 8–23)
CO2: 27 mmol/L (ref 22–32)
Calcium: 9.4 mg/dL (ref 8.9–10.3)
Chloride: 98 mmol/L (ref 98–111)
Creatinine, Ser: 1.76 mg/dL — ABNORMAL HIGH (ref 0.61–1.24)
GFR, Estimated: 41 mL/min — ABNORMAL LOW (ref >60)
Glucose, Bld: 121 mg/dL — ABNORMAL HIGH (ref 70–99)
Potassium: 3.3 mmol/L — ABNORMAL LOW (ref 3.5–5.1)
Sodium: 138 mmol/L (ref 135–145)

## 2022-06-04 LAB — URINALYSIS, ROUTINE W REFLEX MICROSCOPIC
Bacteria, UA: NONE SEEN
Bilirubin Urine: NEGATIVE
Glucose, UA: NEGATIVE mg/dL
Ketones, ur: NEGATIVE mg/dL
Nitrite: NEGATIVE
Protein, ur: 30 mg/dL — AB
RBC / HPF: >50 RBC/hpf (ref 0–5)
Specific Gravity, Urine: 1.012 (ref 1.005–1.030)
pH: 5 (ref 5.0–8.0)

## 2022-06-04 LAB — PROTIME-INR
INR: 7 (ref 0.8–1.2)
Prothrombin Time: 59.8 seconds — ABNORMAL HIGH (ref 11.4–15.2)

## 2022-06-04 MED ORDER — ROSUVASTATIN CALCIUM 10 MG PO TABS
10.0000 mg | ORAL_TABLET | Freq: Every day | ORAL | Status: DC
  Administered 2022-06-05 – 2022-06-10 (×6): 10 mg via ORAL
  Filled 2022-06-04 (×6): qty 1

## 2022-06-04 MED ORDER — FERROUS SULFATE 325 (65 FE) MG PO TABS
325.0000 mg | ORAL_TABLET | Freq: Every day | ORAL | Status: DC
  Administered 2022-06-05 – 2022-06-10 (×6): 325 mg via ORAL
  Filled 2022-06-04 (×6): qty 1

## 2022-06-04 MED ORDER — VITAMIN K1 10 MG/ML IJ SOLN
5.0000 mg | Freq: Once | INTRAVENOUS | Status: AC
  Administered 2022-06-04: 5 mg via INTRAVENOUS
  Filled 2022-06-04: qty 0.5

## 2022-06-04 MED ORDER — METOPROLOL SUCCINATE ER 50 MG PO TB24
25.0000 mg | ORAL_TABLET | Freq: Every day | ORAL | Status: DC
  Administered 2022-06-05 – 2022-06-10 (×6): 25 mg via ORAL
  Filled 2022-06-04 (×6): qty 1

## 2022-06-04 MED ORDER — ACETAMINOPHEN 325 MG PO TABS
650.0000 mg | ORAL_TABLET | Freq: Four times a day (QID) | ORAL | Status: DC | PRN
  Administered 2022-06-05: 650 mg via ORAL
  Filled 2022-06-04 (×3): qty 2

## 2022-06-04 MED ORDER — CEPHALEXIN 500 MG PO CAPS
500.0000 mg | ORAL_CAPSULE | Freq: Once | ORAL | Status: AC
  Administered 2022-06-04: 500 mg via ORAL
  Filled 2022-06-04: qty 1

## 2022-06-04 MED ORDER — CEPHALEXIN 500 MG PO CAPS
500.0000 mg | ORAL_CAPSULE | Freq: Two times a day (BID) | ORAL | Status: DC
  Administered 2022-06-04 – 2022-06-10 (×12): 500 mg via ORAL
  Filled 2022-06-04 (×12): qty 1

## 2022-06-04 MED ORDER — CEPHALEXIN 500 MG PO CAPS: 500.0000 mg | ORAL_CAPSULE | Freq: Two times a day (BID) | ORAL | 0 refills | Status: DC

## 2022-06-04 MED ORDER — LACTATED RINGERS IV SOLN: INTRAVENOUS | Status: AC

## 2022-06-04 NOTE — Assessment & Plan Note (Signed)
-  Goal INR of 2.5-3.5 -supratherpeutic INR up to 7 today likely due to interaction with antibiotics started for UTI -will receive vitamin K  reversal and hold any further warfarin -follow INR daily

## 2022-06-04 NOTE — Assessment & Plan Note (Addendum)
-  UA positive with large hgb, moderate leukocytes and negative nitrates. 21-50WBC.  Had symptoms of urinary frequency/urgency and retention.  Denies any abdominal or pelvic/perineal pain-low concerns for prostatitis. -continue Keflex

## 2022-06-04 NOTE — ED Provider Notes (Signed)
[place] EMERGENCY DEPARTMENT AT [facility] Provider Note   CSN: [id number] Arrival date & time: 06/04/22  1359     History  Chief Complaint  Patient presents with   Urinary Retention    Nathan Frye is a 72 y.o. male.  Patient is a 72 year old male with a history of aortic stenosis status post valve on chronic Coumadin, hypertension, hypercholesterolemia who is presenting today with complaints of chills, dysuria and now feelings of urinary retention.  Patient reports on Friday of last week in the evening is when he started feeling chills.  Over the weekend he was having urinary frequency, chills and no appetite which gradually progressed to inability to urinate since last night.  He is having significant discomfort in his bladder area and has the urge to urinate but cannot.  He denies any fever today and denies any nausea or vomiting.  Denies history of this happening in the past.  Patient was at urgent care last night where he had a dip of his urine done which showed infection and he was started on Cipro.  The history is provided by the patient and medical records.       Home Medications Prior to Admission medications   Medication Sig Start Date End Date Taking? Authorizing Provider  cephALEXin (KEFLEX) 500 MG capsule Take 1 capsule (500 mg total) by mouth 2 (two) times daily. 06/04/22  Yes Gwyneth Sprout, MD  warfarin (COUMADIN) 5 MG tablet TAKE 1 TO 1 AND 1/2 TABLETS DAILY AS DIRECTED BY COUMADIN CLINIC 04/19/22   [place], Peter M, MD  Ergocalciferol 2000 UNITS TABS Take 2,000 Units by mouth daily.    [provider]  ferrous sulfate 325 (65 FE) MG EC tablet Take 1 tablet by mouth daily. 02/10/20   [provider]  hydrochlorothiazide (HYDRODIURIL) 25 MG tablet Take 1 tablet (25 mg total) by mouth daily. 12/17/21 12/12/22  [place], Peter M, MD  latanoprost (XALATAN) 0.005 % ophthalmic solution 1 drop at bedtime. 04/08/20   [provider]   losartan (COZAAR) 100 MG tablet Take 1 tablet (100 mg total) by mouth daily. 12/17/21   [place], Peter M, MD  metoprolol succinate (TOPROL-XL) 25 MG 24 hr tablet Take 1 tablet (25 mg total) by mouth daily. 12/17/21   [place], Peter M, MD  rosuvastatin (CRESTOR) 10 MG tablet Take 1 tablet (10 mg total) by mouth daily. 04/23/22   [place], Peter M, MD      Allergies    Patient has no known allergies.    Review of Systems   Review of Systems  Physical Exam Updated Vital Signs BP 132/61   Pulse 80   Temp 97.8 F (36.6 C) (Oral)   Resp 16   Ht 5\' 9"  (1.753 m)   Wt 75 kg   SpO2 98%   BMI 24.43 kg/m  Physical Exam Vitals and nursing note reviewed.  Constitutional:      General: He is not in acute distress.    Appearance: He is well-developed.  HENT:     Head: Normocephalic and atraumatic.  Eyes:     Conjunctiva/sclera: Conjunctivae normal.     Pupils: Pupils are equal, round, and reactive to light.  Cardiovascular:     Rate and Rhythm: Normal rate and regular rhythm.     Heart sounds: No murmur heard. Pulmonary:     Effort: Pulmonary effort is normal. No respiratory distress.     Breath sounds: Normal breath sounds. No wheezing or rales.  Abdominal:     General: There is no distension.     Palpations: Abdomen is soft.     Tenderness: There is abdominal tenderness. There is no right CVA tenderness, left CVA tenderness, guarding or rebound.  Musculoskeletal:        General: No tenderness. Normal range of motion.     Cervical back: Normal range of motion and neck supple.  Skin:    General: Skin is warm and dry.     Findings: No erythema or rash.  Neurological:     Mental Status: He is alert and oriented to person, place, and time. Mental status is at baseline.  Psychiatric:        Behavior: Behavior normal.     ED Results / Procedures / Treatments   Labs (all labs ordered are listed, but only abnormal results are displayed) Labs Reviewed  URINALYSIS, ROUTINE W REFLEX  MICROSCOPIC - Abnormal; Notable for the following components:      Result Value   APPearance HAZY (*)    Hgb urine dipstick LARGE (*)    Protein, ur 30 (*)    Leukocytes,Ua MODERATE (*)    All other components within normal limits  BASIC METABOLIC PANEL - Abnormal; Notable for the following components:   Potassium 3.3 (*)    Glucose, Bld 121 (*)    BUN 29 (*)    Creatinine, Ser 1.76 (*)    GFR, Estimated 41 (*)    All other components within normal limits  CBC - Abnormal; Notable for the following components:   WBC 17.3 (*)    RBC 3.86 (*)    Hemoglobin 11.2 (*)    HCT 34.7 (*)    All other components within normal limits  PROTIME-INR - Abnormal; Notable for the following components:   Prothrombin Time 59.8 (*)    INR 7.0 (*)    All other components within normal limits    EKG None  Radiology No results found.  Procedures Procedures    Medications Ordered in ED Medications  cephALEXin (KEFLEX) capsule 500 mg (500 mg Oral Given 06/04/22 1710)    ED Course/ Medical Decision Making/ A&P                             Medical Decision Making Amount and/or Complexity of Data Reviewed External Data Reviewed: notes. Labs: ordered. Decision-making details documented in ED Course.  Risk Prescription drug management.   Pt with multiple medical problems and comorbidities and presenting today with a complaint that caries a high risk for morbidity and mortality.  Here today with dysuria, chills and now inability to urinate.  Patient appears to have urinary retention concern for UTI.  He was seen in urgent care yesterday with a urine dip which showed signs suggestive of infection.  A culture was not done at that time.  Patient was started on Cipro however he is also on Coumadin.  He has no drug allergies and does not have frequent UTIs.  Low suspicion for STI or other acute issues.  No prior history of prostatitis and he follows with a urologist regularly.  Foley catheter was  placed with 700 mL returned and patient symptoms completely resolved.  The end of the urine is blood-tinged but patient is on Coumadin.  Last INR at the beginning of the month was 4.2.  He has taken 2 doses of Cipro.  Will recheck INR however a culture will be done and  we will plan on changing him to Keflex to not affect his INR quite so badly.  I independently interpreted patient's labs today and CBC with a leukocytosis of 17 with a stable hemoglobin of 11, BMP with mild AKI today with creatinine 1.76 most likely related for retention as patient's baseline is 1.2-1.3.  He is otherwise well-appearing and hemodynamically stable.  7:01 PM INR is elevated at 7.0 today.  Prior to evaluation today patient has not noticed any signs of bleeding.  Initially when Foley catheter was placed he had yellow urine but now there is some blood in his catheter.  Most likely traumatic from the catheter being placed.  Patient otherwise is feeling well and hemodynamically stable.  His a hemoglobin was 11 and unchanged from prior.  Spoke with pharmacy who at this time recommended holding the Coumadin but reported does not necessarily need any vitamin K.  Discussed with the patient that we need to hold his Coumadin and we could admit him for observation to ensure bleeding did not increase and that hemoglobin levels were stable versus going home holding the Coumadin, calling the Coumadin clinic and the urologist and having close follow-up.  At this time patient wants to keep an eye on his Foley, call the INR clinic in the morning for follow-up and his urologist.  Jovita Gamma him very strict return precautions.  He is comfortable with this plan.         Final Clinical Impression(s) / ED Diagnoses Final diagnoses:  Urinary retention  Acute cystitis with hematuria  Supratherapeutic INR    Rx / DC Orders ED Discharge Orders          Ordered    cephALEXin (KEFLEX) 500 MG capsule  2 times daily        06/04/22 1901               Gwyneth Sprout, MD 06/04/22 1901

## 2022-06-04 NOTE — Discharge Instructions (Signed)
You need to follow-up with your INR with the Coumadin clinic.  Keep an eye on your Foley catheter.  If it starts looking very bloody and you become concerned you can return to the emergency room.  Hold your Coumadin until you talk to the clinic.  Your urologist should be able to remove your catheter once the infection is treated.

## 2022-06-04 NOTE — Assessment & Plan Note (Signed)
Creatinine mildly elevated at 1.76 -keep on continuous IV fluid -avoid nephrotoxic agent -hold home Losartan and HCTZ due to AKI

## 2022-06-04 NOTE — Assessment & Plan Note (Signed)
-  continue metoprolol -hold losartan and HCTZ due to AKI

## 2022-06-04 NOTE — ED Triage Notes (Signed)
Patient reports he was just discharged with foley a couple hours ago. States he has noticed blood in urine, states it was turning red before leaving. Told him if it continues to come back. Is on coumadin.

## 2022-06-04 NOTE — Assessment & Plan Note (Addendum)
-  pt with recently diagnosed UTI started on Cipro and urinary retention with indwelling foley catheter placed earlier this morning presents with gross hematuria and supratherapeutic INR of 7. Pt on Coumadin for aortic valve replacement. S/p vitamin K with improvement of INR to 2.9 today. Hemoglobin decreased to 9.8.  Patient was already started on iron supplement. Discussed with urology on secure chat and hoping it will resolve within next 24 to 48 hours. -Monitor INR and hemoglobin -Transfuse if below 7

## 2022-06-04 NOTE — ED Triage Notes (Signed)
Pt had chills on Friday and has been unable to void well.  Pt was seen at [facility] and was dx with possible prostatitis and placed on abt.  Pt went back to [facility] today and were told to come to ED due to urinary retention.  Pt has been unable to void very little in the past 24 hours.  Pt feels like his bladder is distended.

## 2022-06-04 NOTE — ED Provider Notes (Signed)
[place] EMERGENCY DEPARTMENT AT [facility] Provider Note   CSN: [id number] Arrival date & time: 06/04/22  2037     History  Chief Complaint  Patient presents with   Hematuria    Nathan Frye is a 72 y.o. male.  Patient was seen earlier this evening after having UTI, urinary retention and supratherapeutic INR.  Patient was having mild bleeding in his Foley catheter at that time but it is a Kool-Aid color however after returning home it is now become more frank blood in it is now leaking around his penis as well.  This concerned him and he decided to return to the ER.  Patient has some clot at the meatus and blood present in the bag without clots.  The history is provided by the patient.  Hematuria       Home Medications Prior to Admission medications   Medication Sig Start Date End Date Taking? Authorizing Provider  cephALEXin (KEFLEX) 500 MG capsule Take 1 capsule (500 mg total) by mouth 2 (two) times daily. 06/04/22  Yes Gwyneth Sprout, MD  Ergocalciferol 2000 UNITS TABS Take 2,000 Units by mouth daily.   Yes [provider]  ferrous sulfate 325 (65 FE) MG EC tablet Take 1 tablet by mouth daily. 02/10/20  Yes [provider]  hydrochlorothiazide (HYDRODIURIL) 25 MG tablet Take 1 tablet (25 mg total) by mouth daily. 12/17/21 12/12/22 Yes [place], Peter M, MD  latanoprost (XALATAN) 0.005 % ophthalmic solution 1 drop at bedtime. 04/08/20  Yes [provider]  losartan (COZAAR) 100 MG tablet Take 1 tablet (100 mg total) by mouth daily. 12/17/21  Yes [place], Peter M, MD  metoprolol succinate (TOPROL-XL) 25 MG 24 hr tablet Take 1 tablet (25 mg total) by mouth daily. 12/17/21  Yes [place], Peter M, MD  rosuvastatin (CRESTOR) 10 MG tablet Take 1 tablet (10 mg total) by mouth daily. 04/23/22  Yes [place], Peter M, MD  warfarin (COUMADIN) 5 MG tablet TAKE 1 TO 1 AND 1/2 TABLETS DAILY AS DIRECTED BY COUMADIN CLINIC Patient taking differently: Take  1-2 tablets by mouth daily. Sunday,Monday,Wednesday and Friday 1 tablet(5mg ) Tuesday Thursday Saturday 1.5 tablet (7.5) 04/19/22  Yes [place], Peter M, MD  ciprofloxacin (CIPRO) 250 MG tablet Take 250 mg by mouth 2 (two) times daily. Patient not taking: Reported on 06/04/2022 06/03/22 06/08/22  [provider]      Allergies    Patient has no known allergies.    Review of Systems   Review of Systems  Genitourinary:  Positive for hematuria.    Physical Exam Updated Vital Signs BP (!) 135/58 (BP Location: Left Arm)   Pulse 83   Temp 98.2 F (36.8 C) (Oral)   Resp 18   Ht  (1.753 m)   Wt 75 kg   SpO2 97%   BMI 24.42 kg/m  Physical Exam Vitals and nursing note reviewed.  Constitutional:      General: He is not in acute distress.    Appearance: He is well-developed.  HENT:     Head: Normocephalic and atraumatic.  Eyes:     Conjunctiva/sclera: Conjunctivae normal.     Pupils: Pupils are equal, round, and reactive to light.  Cardiovascular:     Rate and Rhythm: Normal rate and regular rhythm.     Heart sounds: No murmur heard. Pulmonary:     Effort: Pulmonary effort is normal. No respiratory distress.     Breath sounds: Normal breath sounds. No wheezing or rales.  Abdominal:     General: There is no distension.     Palpations: Abdomen is soft.     Tenderness: There is no abdominal tenderness. There is no guarding or rebound.  Genitourinary:    Comments: Foley catheter in place with blood oozing around the meatus and some minimal clot.  Also blood present in the Foley bag without clot present. Musculoskeletal:        General: No tenderness. Normal range of motion.     Cervical back: Normal range of motion and neck supple.  Skin:    General: Skin is warm and dry.     Findings: No erythema or rash.  Neurological:     Mental Status: He is alert and oriented to person, place, and time.  Psychiatric:        Behavior: Behavior normal.     ED Results / Procedures  / Treatments   Labs (all labs ordered are listed, but only abnormal results are displayed) Labs Reviewed - No data to display  EKG None  Radiology No results found.  Procedures Procedures    Medications Ordered in ED Medications  phytonadione (VITAMIN K) 5 mg in dextrose 5 % 50 mL IVPB (has no administration in time range)    ED Course/ Medical Decision Making/ A&P                             Medical Decision Making Risk Decision regarding hospitalization.   Pt with multiple medical problems and comorbidities and presenting today with a complaint that caries a high risk for morbidity and mortality.  Here today with known supratherapeutic INR of 7.0 and worsening blood in the Foley catheter.  Will plan on admission for further care and improvement in INR.  Will discuss with hospitalist and give IV vitamin K.  Patient is comfortable with this plan.  Remains hemodynamically stable.  CRITICAL CARE Performed by: Lucelia Lacey Total critical care time: 30 minutes Critical care time was exclusive of separately billable procedures and treating other patients. Critical care was necessary to treat or prevent imminent or life-threatening deterioration. Critical care was time spent personally by me on the following activities: development of treatment plan with patient and/or surrogate as well as nursing, discussions with consultants, evaluation of patient's response to treatment, examination of patient, obtaining history from patient or surrogate, ordering and performing treatments and interventions, ordering and review of laboratory studies, ordering and review of radiographic studies, pulse oximetry and re-evaluation of patient's condition.        Final Clinical Impression(s) / ED Diagnoses Final diagnoses:  Gross hematuria  Supratherapeutic INR    Rx / DC Orders ED Discharge Orders     None         Gwyneth Sprout, MD 06/04/22 2236

## 2022-06-04 NOTE — Assessment & Plan Note (Signed)
continue statin

## 2022-06-04 NOTE — H&P (Signed)
History and Physical    Patient: Nathan Frye WUJ:[id number] DOB: [birth year] DOA: 06/04/2022 DOS: the patient was seen and examined on 06/04/2022 PCP: Wilfred Curtis, MD  Patient coming from: Home  Chief Complaint:  Chief Complaint  Patient presents with   Hematuria   HPI: Nathan Frye is a 72 y.o. male with medical history significant of aortic valve stenosis s/p TAVR on Coumadin, HTN, HLD, hx of chronic prostatitis who presents with gross hematuria with indwelling foley catheter.  Pt was just evaluated earlier today in ED with urinary retention. Had chills starting last week and then progressively had urinary urgency, frequency, chills and no appetite. He was seen in Urgent care yesterday with urinary retention and diarrhea.He was started on Cipro for positive UA and was took 3 doses so far.However had worsening dribbling and retention and went back to urgent care today and was told to go to ER.  Foley was placed in ED with output. Had mild bleeding at foley catheter at the time thought due to trauma. Antibiotics was switched from Cipro to Keflex to avoid interaction with warfarin. He was then discharged home.   Pt now returns with gross hematuria from foley catheter placed earlier this morning. INR elevated to 7. He did take a dose of warfarin this morning. Previous Hgb checked earlier today at 11.2. Cr with mild AKI at 1.76.   Had discussion with EDP Dr. Anitra Lauth and due to worsening hematuria with supratherpeutic INR will give IV  vitamin K and admit for observation of Hgb and INR.    Review of Systems: As mentioned in the history of present illness. All other systems reviewed and are negative. Past Medical History:  Diagnosis Date   Chronic anticoagulation    HTN (hypertension)    Hypercholesteremia    Severe aortic stenosis    Past Surgical History:  Procedure Laterality Date   AORTIC VALVE REPLACEMENT  2007   #21 MM [facility] VALVE   VASECTOMY      Social History:  reports that he has never smoked. He has never used smokeless tobacco. He reports that he does not drink alcohol and does not use drugs.  No Known Allergies  Family History  Problem Relation Age of Onset   Hypertension Mother     Prior to Admission medications   Medication Sig Start Date End Date Taking? Authorizing Provider  cephALEXin (KEFLEX) 500 MG capsule Take 1 capsule (500 mg total) by mouth 2 (two) times daily. 06/04/22  Yes Gwyneth Sprout, MD  Ergocalciferol 2000 UNITS TABS Take 2,000 Units by mouth daily.   Yes [provider]  ferrous sulfate 325 (65 FE) MG EC tablet Take 1 tablet by mouth daily. 02/10/20  Yes [provider]  hydrochlorothiazide (HYDRODIURIL) 25 MG tablet Take 1 tablet (25 mg total) by mouth daily. 12/17/21 12/12/22 Yes [place], Peter M, MD  latanoprost (XALATAN) 0.005 % ophthalmic solution 1 drop at bedtime. 04/08/20  Yes [provider]  losartan (COZAAR) 100 MG tablet Take 1 tablet (100 mg total) by mouth daily. 12/17/21  Yes [place], Peter M, MD  metoprolol succinate (TOPROL-XL) 25 MG 24 hr tablet Take 1 tablet (25 mg total) by mouth daily. 12/17/21  Yes [place], Peter M, MD  rosuvastatin (CRESTOR) 10 MG tablet Take 1 tablet (10 mg total) by mouth daily. 04/23/22  Yes [place], Peter M, MD  warfarin (COUMADIN) 5 MG tablet TAKE 1 TO 1 AND 1/2 TABLETS DAILY AS DIRECTED BY COUMADIN CLINIC Patient  taking differently: Take 1-2 tablets by mouth daily. Sunday,Monday,Wednesday and Friday 1 tablet(5mg ) Tuesday Thursday Saturday 1.5 tablet (7.5) 04/19/22  Yes [place], Peter M, MD  ciprofloxacin (CIPRO) 250 MG tablet Take 250 mg by mouth 2 (two) times daily. Patient not taking: Reported on 06/04/2022 06/03/22 06/08/22  [provider]    Physical Exam: Vitals:   06/04/22 2052 06/04/22 2054 06/04/22 2131  BP: 131/66  (!) 135/58  Pulse: 87  83  Resp: 18  18  Temp: 98.2 F (36.8 C)  98.2 F (36.8 C)  TempSrc: Oral  Oral   SpO2: 95%  97%  Weight:  75 kg   Height:   (1.753 m)    Constitutional: NAD, calm, comfortable, thin elderly male appearing younger than stated age sitting in bed with rigors Eyes: lids and conjunctivae normal ENMT: Mucous membranes are moist.  Neck: normal, supple Respiratory: clear to auscultation bilaterally, no wheezing, no crackles. Normal respiratory effort. No accessory muscle use.  Cardiovascular: Regular rate and rhythm, no murmurs / rubs / gallops. No extremity edema.  Abdomen: no tenderness, soft, non-distended. Bowel sounds positive.  Musculoskeletal: no clubbing / cyanosis. No joint deformity upper and lower extremities. Good ROM, no contractures. Normal muscle tone.  Skin: no rashes, lesions, ulcers.  Neurologic: CN 2-12 grossly intact. Psychiatric: Normal judgment and insight. Alert and oriented x 3. Normal mood. Data Reviewed:  See HPI  Assessment and Plan: * Hematuria -pt with recently diagnosed UTI started on Cipro and urinary retention with indwelling foley catheter placed earlier this morning presents with gross hematuria and supratherapeutic INR of 7. Pt on Coumadin for aortic valve replacement -will be given IV Vitamin K  for reversal in ED -hold any further warfarin-took a dose this AM 4/23  -Follow repeat INR and hematuria tomorrow  -last Hgb stable at 11.2. Follow repeat in the morning.  AKI (acute kidney injury) Creatinine mildly elevated at 1.76 -keep on continuous IV fluid -avoid nephrotoxic agent -hold home Losartan and HCTZ due to AKI  UTI (urinary tract infection) -UA positive with large hgb, moderate leukocytes and negative nitrates. 21-50WBC.  Had symptoms of urinary frequency/urgency and retention.  Denies any abdominal or pelvic/perineal pain-low concerns for prostatitis. -continue Keflex  -urine culture pending  H/O aortic valve replacement -Goal INR of 2.5-3.5 -supratherpeutic INR up to 7 today likely due to interaction with  antibiotics started for UTI -will receive vitamin K  reversal and hold any further warfarin -follow INR daily  Hypercholesteremia -continue statin  HTN (hypertension) -continue metoprolol -hold losartan and HCTZ due to AKI      Advance Care Planning: Full  Consults: none  Family Communication: none at bedside  Severity of Illness: The appropriate patient status for this patient is OBSERVATION. Observation status is judged to be reasonable and necessary in order to provide the required intensity of service to ensure the patient's safety. The patient's presenting symptoms, physical exam findings, and initial radiographic and laboratory data in the context of their medical condition is felt to place them at decreased risk for further clinical deterioration. Furthermore, it is anticipated that the patient will be medically stable for discharge from the hospital within 2 midnights of admission.   Author: Anselm Jungling, DO 06/04/2022 11:14 PM  For on call review [email or web address].

## 2022-06-05 ENCOUNTER — Telehealth: Payer: Self-pay | Admitting: Cardiology

## 2022-06-05 DIAGNOSIS — N179 Acute kidney failure, unspecified: Secondary | ICD-10-CM | POA: Diagnosis not present

## 2022-06-05 DIAGNOSIS — I10 Essential (primary) hypertension: Secondary | ICD-10-CM | POA: Diagnosis not present

## 2022-06-05 DIAGNOSIS — Z79899 Other long term (current) drug therapy: Secondary | ICD-10-CM | POA: Diagnosis not present

## 2022-06-05 DIAGNOSIS — N3001 Acute cystitis with hematuria: Secondary | ICD-10-CM | POA: Diagnosis not present

## 2022-06-05 DIAGNOSIS — N400 Enlarged prostate without lower urinary tract symptoms: Secondary | ICD-10-CM | POA: Diagnosis present

## 2022-06-05 DIAGNOSIS — D62 Acute posthemorrhagic anemia: Secondary | ICD-10-CM | POA: Diagnosis not present

## 2022-06-05 DIAGNOSIS — R339 Retention of urine, unspecified: Secondary | ICD-10-CM | POA: Diagnosis present

## 2022-06-05 DIAGNOSIS — R310 Gross hematuria: Secondary | ICD-10-CM | POA: Diagnosis present

## 2022-06-05 DIAGNOSIS — Z952 Presence of prosthetic heart valve: Secondary | ICD-10-CM | POA: Diagnosis not present

## 2022-06-05 DIAGNOSIS — R791 Abnormal coagulation profile: Secondary | ICD-10-CM | POA: Diagnosis not present

## 2022-06-05 DIAGNOSIS — E7800 Pure hypercholesterolemia, unspecified: Secondary | ICD-10-CM | POA: Diagnosis present

## 2022-06-05 DIAGNOSIS — E876 Hypokalemia: Secondary | ICD-10-CM | POA: Diagnosis present

## 2022-06-05 DIAGNOSIS — N390 Urinary tract infection, site not specified: Secondary | ICD-10-CM | POA: Diagnosis present

## 2022-06-05 DIAGNOSIS — I129 Hypertensive chronic kidney disease with stage 1 through stage 4 chronic kidney disease, or unspecified chronic kidney disease: Secondary | ICD-10-CM | POA: Diagnosis present

## 2022-06-05 DIAGNOSIS — Z7901 Long term (current) use of anticoagulants: Secondary | ICD-10-CM | POA: Diagnosis not present

## 2022-06-05 DIAGNOSIS — N1831 Chronic kidney disease, stage 3a: Secondary | ICD-10-CM | POA: Diagnosis present

## 2022-06-05 DIAGNOSIS — Z8249 Family history of ischemic heart disease and other diseases of the circulatory system: Secondary | ICD-10-CM | POA: Diagnosis not present

## 2022-06-05 LAB — CBC
HCT: 30.1 % — ABNORMAL LOW (ref 39.0–52.0)
Hemoglobin: 9.8 g/dL — ABNORMAL LOW (ref 13.0–17.0)
MCH: 29 pg (ref 26.0–34.0)
MCHC: 32.6 g/dL (ref 30.0–36.0)
MCV: 89.1 fL (ref 80.0–100.0)
Platelets: 183 10*3/uL (ref 150–400)
RBC: 3.38 MIL/uL — ABNORMAL LOW (ref 4.22–5.81)
RDW: 11.6 % (ref 11.5–15.5)
WBC: 10.4 10*3/uL (ref 4.0–10.5)
nRBC: 0 % (ref 0.0–0.2)

## 2022-06-05 LAB — PROTIME-INR
INR: 2.9 — ABNORMAL HIGH (ref 0.8–1.2)
Prothrombin Time: 30.1 seconds — ABNORMAL HIGH (ref 11.4–15.2)

## 2022-06-05 LAB — BASIC METABOLIC PANEL
Anion gap: 12 (ref 5–15)
BUN: 29 mg/dL — ABNORMAL HIGH (ref 8–23)
CO2: 23 mmol/L (ref 22–32)
Calcium: 8.2 mg/dL — ABNORMAL LOW (ref 8.9–10.3)
Chloride: 101 mmol/L (ref 98–111)
Creatinine, Ser: 2.03 mg/dL — ABNORMAL HIGH (ref 0.61–1.24)
GFR, Estimated: 34 mL/min — ABNORMAL LOW (ref >60)
Glucose, Bld: 115 mg/dL — ABNORMAL HIGH (ref 70–99)
Potassium: 2.5 mmol/L — CL (ref 3.5–5.1)
Sodium: 136 mmol/L (ref 135–145)

## 2022-06-05 LAB — MAGNESIUM: Magnesium: 1.7 mg/dL (ref 1.7–2.4)

## 2022-06-05 MED ORDER — LIP MEDEX EX OINT
1.0000 | TOPICAL_OINTMENT | CUTANEOUS | Status: DC | PRN
  Administered 2022-06-05: 1 via TOPICAL
  Filled 2022-06-05: qty 7

## 2022-06-05 MED ORDER — POTASSIUM CHLORIDE CRYS ER 20 MEQ PO TBCR
40.0000 meq | EXTENDED_RELEASE_TABLET | ORAL | Status: AC
  Administered 2022-06-05 (×2): 40 meq via ORAL
  Filled 2022-06-05 (×2): qty 2

## 2022-06-05 MED ORDER — POTASSIUM CHLORIDE 20 MEQ PO PACK
60.0000 meq | PACK | Freq: Once | ORAL | Status: AC
  Administered 2022-06-05: 60 meq via ORAL
  Filled 2022-06-05: qty 3

## 2022-06-05 MED ORDER — CHLORHEXIDINE GLUCONATE CLOTH 2 % EX PADS
6.0000 | MEDICATED_PAD | Freq: Every day | CUTANEOUS | Status: DC
  Administered 2022-06-05 – 2022-06-08 (×3): 6 via TOPICAL

## 2022-06-05 NOTE — TOC CM/SW Note (Signed)
  Transition of Care [facility]) Screening Note   Patient Details  Name: Nathan Frye Date of Birth: 12/20/1950   Transition of Care [facility]) CM/SW Contact:    Howell Rucks, RN Phone Number: 06/05/2022, 4:30 PM    Transition of Care Department [facility]) has reviewed patient and no TOC needs have been identified at this time. We will continue to monitor patient advancement through interdisciplinary progression rounds. If new patient transition needs arise, please place a TOC consult.

## 2022-06-05 NOTE — Telephone Encounter (Signed)
Patient suppose to have a coumadin appt on tomorrow but he is in the hospital. Calling to see how he should move forward. Please advise

## 2022-06-05 NOTE — Assessment & Plan Note (Signed)
With borderline low normal magnesium.  Potassium at 2.5 -Replete potassium and magnesium -Continue to monitor

## 2022-06-05 NOTE — Assessment & Plan Note (Signed)
Patient was found to have INR of 7 on admission.  Most likely taking Cipro and Coumadin with drug interaction.  S/p vitamin K injection. INR improved to 2.9 today. -Continue Coumadin per pharmacy -Continue to monitor INR

## 2022-06-05 NOTE — Telephone Encounter (Signed)
Returned call to pt, pt is in the hospital with gross hematuria.  Pt went to urgent care  with UTI and was rx Cipro, took 3 dosages of 5 day course.  Returned to urgent care with urinary retention and they sent pt to ED.  Pt's INR upon admission yesterday 4/23 was 7.0, today INR 2.9 pt is currently holding Warfarin.  Advised pt I would cancel his Coumadin Clinic appt for tomorrow, and pt agrees to call us back after discharge to reschedule follow-up appt. Advised pt I hoped he got to feeling better soon and we will await his call back to r/s his f/u appt.

## 2022-06-05 NOTE — Hospital Course (Addendum)
Taken from H&P.   Nathan Frye is a 72 y.o. male with medical history significant of aortic valve stenosis s/p TAVR on Coumadin, HTN, HLD, hx of chronic prostatitis who presents with gross hematuria with indwelling foley catheter.   Pt was just evaluated earlier today in ED with urinary retention. Had chills starting last week and then progressively had urinary urgency, frequency, chills and no appetite. He was seen in Urgent care yesterday with urinary retention and diarrhea.He was started on Cipro for positive UA and was took 3 doses so far.However had worsening dribbling and retention and went back to urgent care today and was told to go to ER.  Foley was placed in ED with output. Had mild bleeding at foley catheter at the time thought due to trauma. Antibiotics was switched from Cipro to Keflex to avoid interaction with warfarin. He was then discharged home.    Pt now returns with gross hematuria from foley catheter placed earlier this morning. INR elevated to 7. He did take a dose of warfarin this morning. Previous Hgb checked earlier today at 11.2. Cr with mild AKI at 1.76.    Had discussion with EDP Dr. Anitra Lauth and due to worsening hematuria with supratherpeutic INR will give IV  vitamin K and admit for observation of Hgb and INR.   4/24: Vital stable.  INR improved to 2.9, hemoglobin decreased to 9.8, potassium at 2.5.  Which is being repleted, magnesium  low normal at 1.7 Patient was also started on iron supplement.  Discussed with his urologist Dr. Ronne Binning on secure chat, they would like to observe and hoping that hematuria will improve within next 24 to 48 hours as most likely secondary to supratherapeutic INR.  They will follow-up as outpatient and patient will be going home with Foley catheter.  4/25: Hemodynamically stable.  Urine started clearing off.  No pain.  INR subtherapeutic at 1.6 today.  He was started back on Coumadin without any bridge due to recent bleed.  Also  started on Flomax.  Patient has an appointment with urology on 06/19/2022 for voiding trial. We will keep him for another day and most likely be discharged tomorrow with Foley catheter and Flomax to follow-up with urology as outpatient.

## 2022-06-05 NOTE — ED Notes (Signed)
ED TO INPATIENT HANDOFF REPORT  Name/Age/Gender Nathan Frye 72 y.o. male  Code Status    Code Status Orders  (From admission, onward)           Start     Ordered   06/04/22 2243  Full code  Continuous       Question:  By:  Answer:  Consent: discussion documented in EHR   06/04/22 2243           Code Status History     This patient has a current code status but no historical code status.       Home/SNF/Other Home  Chief Complaint Hematuria [R31.9]  Level of Care/Admitting Diagnosis ED Disposition     ED Disposition  Admit   Condition  --   Comment  Hospital Area: [facility] [[id number]]  Level of Care: Med-Surg [16]  May place patient in observation at [facility] or [facility] if equivalent level of care is available:: No  Covid Evaluation: Asymptomatic - no recent exposure (last 10 days) testing not required  Diagnosis: Hematuria [741758]  Admitting Physician: Anselm Jungling [[id number]]  Attending Physician: Anselm Jungling [[id number]]          Medical History Past Medical History:  Diagnosis Date   Chronic anticoagulation    HTN (hypertension)    Hypercholesteremia    Severe aortic stenosis     Allergies No Known Allergies  IV Location/Drains/Wounds Patient Lines/Drains/Airways Status     Active Line/Drains/Airways     Name Placement date Placement time Site Days   Peripheral IV 06/04/22 22 G 1" Right Antecubital 06/04/22  2227  Antecubital  1   Urethral Catheter Straight-tip 14 Fr. 06/04/22  1819  Straight-tip  1            Labs/Imaging Results for orders placed or performed during the hospital encounter of 06/04/22 (from the past 48 hour(s))  CBC     Status: Abnormal   Collection Time: 06/05/22  3:26 AM  Result Value Ref Range   WBC 10.4 4.0 - 10.5 K/uL   RBC 3.38 (L) 4.22 - 5.81 MIL/uL   Hemoglobin 9.8 (L) 13.0 - 17.0 g/dL   HCT 41.3 (L) 24.4 - 01.0 %   MCV 89.1 80.0 - 100.0 fL   MCH 29.0 26.0 - 34.0 pg    MCHC 32.6 30.0 - 36.0 g/dL   RDW 27.2 53.6 - 64.4 %   Platelets 183 150 - 400 K/uL   nRBC 0.0 0.0 - 0.2 %    Comment: Performed at [facility], [address], [place], [place] [address]  Basic metabolic panel     Status: Abnormal   Collection Time: 06/05/22  3:26 AM  Result Value Ref Range   Sodium 136 135 - 145 mmol/L   Potassium 2.5 (LL) 3.5 - 5.1 mmol/L    Comment: CRITICAL RESULT CALLED TO, READ BACK BY AND VERIFIED WITH Leialoha Hanna,D EMT  06/05/22 BY CHILDRESS,E   Chloride 101 98 - 111 mmol/L   CO2 23 22 - 32 mmol/L   Glucose, Bld 115 (H) 70 - 99 mg/dL    Comment: Glucose reference range applies only to samples taken after fasting for at least 8 hours.   BUN 29 (H) 8 - 23 mg/dL   Creatinine, Ser 2.59 (H) 0.61 - 1.24 mg/dL   Calcium 8.2 (L) 8.9 - 10.3 mg/dL   GFR, Estimated 34 (L) >60 mL/min    Comment: (NOTE) Calculated using  the CKD-EPI Creatinine Equation (2021)    Anion gap 12 5 - 15    Comment: Performed at [facility], [address] [place], [place] [address]  Protime-INR     Status: Abnormal   Collection Time: 06/05/22  3:26 AM  Result Value Ref Range   Prothrombin Time 30.1 (H) 11.4 - 15.2 seconds   INR 2.9 (H) 0.8 - 1.2    Comment: (NOTE) INR goal varies based on device and disease states. Performed at [facility], [address] [place], [place] [address]    No results found.  Pending Labs Unresulted Labs (From admission, onward)    None       Vitals/Pain Today's Vitals   06/04/22 2054 06/04/22 2131 06/05/22 0145 06/05/22 0456  BP:  (!) 135/58 (!) 103/45 (!) 96/53  Pulse:  83 96 90  Resp:  Temp:  98.2 F (36.8 C) 98.7 F (37.1 C) 100.2 F (37.9 C)  TempSrc:  Oral Oral Oral  SpO2:  97% 98% 97%  Weight: 165 lb 5.5 oz (75 kg)     Height:  (1.753 m)     PainSc: 0-No pain       Isolation Precautions No active isolations  Medications Medications  metoprolol  succinate (TOPROL-XL) 24 hr tablet 25 mg (has no administration in time range)  rosuvastatin (CRESTOR) tablet 10 mg (has no administration in time range)  ferrous sulfate tablet 325 mg (has no administration in time range)  acetaminophen (TYLENOL) tablet 650 mg (has no administration in time range)  cephALEXin (KEFLEX) capsule 500 mg (500 mg Oral Given 06/04/22 2328)  lactated ringers infusion ( Intravenous New Bag/Given 06/05/22 0043)  lip balm (CARMEX) ointment 1 Application (1 Application Topical Given 06/05/22 0515)  phytonadione (VITAMIN K) 5 mg in dextrose 5 % 50 mL IVPB (0 mg Intravenous Stopped 06/05/22 0036)  potassium chloride (KLOR-CON) packet 60 mEq (60 mEq Oral Given 06/05/22 0434)    Mobility walks

## 2022-06-05 NOTE — Progress Notes (Signed)
Progress Note   Patient: Nathan Frye ZOX:[id number] DOB: Jan 20, 1951 DOA: 06/04/2022     0 DOS: the patient was seen and examined on 06/05/2022   Brief hospital course: Taken from H&P.   Nathan Frye is a 72 y.o. male with medical history significant of aortic valve stenosis s/p TAVR on Coumadin, HTN, HLD, hx of chronic prostatitis who presents with gross hematuria with indwelling foley catheter.   Pt was just evaluated earlier today in ED with urinary retention. Had chills starting last week and then progressively had urinary urgency, frequency, chills and no appetite. He was seen in Urgent care yesterday with urinary retention and diarrhea.He was started on Cipro for positive UA and was took 3 doses so far.However had worsening dribbling and retention and went back to urgent care today and was told to go to ER.  Foley was placed in ED with output. Had mild bleeding at foley catheter at the time thought due to trauma. Antibiotics was switched from Cipro to Keflex to avoid interaction with warfarin. He was then discharged home.    Pt now returns with gross hematuria from foley catheter placed earlier this morning. INR elevated to 7. He did take a dose of warfarin this morning. Previous Hgb checked earlier today at 11.2. Cr with mild AKI at 1.76.    Had discussion with EDP Dr. Anitra Lauth and due to worsening hematuria with supratherpeutic INR will give IV  vitamin K and admit for observation of Hgb and INR.   4/24: Vital stable.  INR improved to 2.9, hemoglobin decreased to 9.8, potassium at 2.5.  Which is being repleted, magnesium Patient was also started on iron supplement.    Assessment and Plan: * Hematuria -pt with recently diagnosed UTI started on Cipro and urinary retention with indwelling foley catheter placed earlier this morning presents with gross hematuria and supratherapeutic INR of 7. Pt on Coumadin for aortic valve replacement. S/p vitamin K with improvement of  INR to 2.9 today. Hemoglobin decreased to 9.8.  Patient was already started on iron supplement. Discussed with urology on secure chat and hoping it will resolve within next 24 to 48 hours. -Monitor INR and hemoglobin -Transfuse if below 7  UTI (urinary tract infection) -UA positive with large hgb, moderate leukocytes and negative nitrates. 21-50WBC.  Had symptoms of urinary frequency/urgency and retention.  Denies any abdominal or pelvic/perineal pain-low concerns for prostatitis. -continue Keflex    AKI (acute kidney injury) Appears to have CKD stage IIIa with baseline creatinine at 1.2-1.3 according to Care Everywhere. Slight worsening of creatinine to 2.03 -keep on continuous IV fluid -avoid nephrotoxic agent -hold home Losartan and HCTZ due to AKI  Supratherapeutic INR Patient was found to have INR of 7 on admission.  Most likely taking Cipro and Coumadin with drug interaction.  S/p vitamin K injection. INR improved to 2.9 today. -Continue Coumadin per pharmacy -Continue to monitor INR  H/O aortic valve replacement -Goal INR of 2.5-3.5 -supratherpeutic INR up to 7  likely due to interaction with antibiotics started for UTI -S/p vitamin K injection with improvement of INR to 2.9 -Coumadin per pharmacy -follow INR daily  HTN (hypertension) -continue metoprolol -hold losartan and HCTZ due to AKI  Hypokalemia With borderline low normal magnesium.  Potassium at 2.5 -Replete potassium and magnesium -Continue to monitor  Hypercholesteremia -continue statin   Subjective: Patient was seen and examined today.  Denies any complaint.  Continue to have gross hematuria  Physical Exam: Vitals:   06/05/22  0145 06/05/22 0456 06/05/22 0646 06/05/22 1247  BP: (!) 103/45 (!) 96/53 107/63 (!) 96/59  Pulse: 96 90 81 77  Resp: Temp: 98.7 F (37.1 C) 100.2 F (37.9 C) 97.7 F (36.5 C) 98.2 F (36.8 C)  TempSrc: Oral Oral Oral Oral  SpO2: 98% 97% 97% 96%  Weight:       Height:       General.  Well-developed gentleman, in no acute distress. Pulmonary.  Lungs clear bilaterally, normal respiratory effort. CV.  Regular rate and rhythm, no JVD, rub or murmur. Abdomen.  Soft, nontender, nondistended, BS positive. CNS.  Alert and oriented .  No focal neurologic deficit. Extremities.  No edema, no cyanosis, pulses intact and symmetrical. Psychiatry.  Judgment and insight appears normal.   Data Reviewed: Prior data reviewed  Family Communication: Discussed with daughter at bedside  Disposition: Status is: Observation The patient will require care spanning > 2 midnights and should be moved to inpatient because: Severity of illness  Planned Discharge Destination: Home  Time spent: 45 minutes  This record has been created using Conservation officer, historic buildings. Errors have been sought and corrected,but may not always be located. Such creation errors do not reflect on the standard of care.   Author: Arnetha Courser, MD 06/05/2022 1:57 PM  For on call review [email or web address].

## 2022-06-05 NOTE — Care Management Obs Status (Signed)
MEDICARE OBSERVATION STATUS NOTIFICATION   Patient Details  Name: LOY MCCARTT MRN: [id number] Date of Birth: 1950/11/27   Medicare Observation Status Notification Given:       Howell Rucks, RN 06/05/2022, 3:50 PM

## 2022-06-06 ENCOUNTER — Ambulatory Visit: Payer: Medicare Other

## 2022-06-06 DIAGNOSIS — I10 Essential (primary) hypertension: Secondary | ICD-10-CM | POA: Diagnosis not present

## 2022-06-06 DIAGNOSIS — R791 Abnormal coagulation profile: Secondary | ICD-10-CM | POA: Diagnosis not present

## 2022-06-06 DIAGNOSIS — N179 Acute kidney failure, unspecified: Secondary | ICD-10-CM | POA: Diagnosis not present

## 2022-06-06 DIAGNOSIS — N3001 Acute cystitis with hematuria: Secondary | ICD-10-CM | POA: Diagnosis not present

## 2022-06-06 LAB — PROTIME-INR
INR: 1.6 — ABNORMAL HIGH (ref 0.8–1.2)
Prothrombin Time: 18.5 seconds — ABNORMAL HIGH (ref 11.4–15.2)

## 2022-06-06 LAB — CBC
HCT: 29.2 % — ABNORMAL LOW (ref 39.0–52.0)
Hemoglobin: 9.4 g/dL — ABNORMAL LOW (ref 13.0–17.0)
MCH: 29.2 pg (ref 26.0–34.0)
MCHC: 32.2 g/dL (ref 30.0–36.0)
MCV: 90.7 fL (ref 80.0–100.0)
Platelets: 204 10*3/uL (ref 150–400)
RBC: 3.22 MIL/uL — ABNORMAL LOW (ref 4.22–5.81)
RDW: 12 % (ref 11.5–15.5)
WBC: 13.5 10*3/uL — ABNORMAL HIGH (ref 4.0–10.5)
nRBC: 0 % (ref 0.0–0.2)

## 2022-06-06 LAB — BASIC METABOLIC PANEL
Anion gap: 9 (ref 5–15)
BUN: 27 mg/dL — ABNORMAL HIGH (ref 8–23)
CO2: 26 mmol/L (ref 22–32)
Calcium: 8.6 mg/dL — ABNORMAL LOW (ref 8.9–10.3)
Chloride: 102 mmol/L (ref 98–111)
Creatinine, Ser: 1.53 mg/dL — ABNORMAL HIGH (ref 0.61–1.24)
GFR, Estimated: 48 mL/min — ABNORMAL LOW (ref >60)
Glucose, Bld: 124 mg/dL — ABNORMAL HIGH (ref 70–99)
Potassium: 3.7 mmol/L (ref 3.5–5.1)
Sodium: 137 mmol/L (ref 135–145)

## 2022-06-06 MED ORDER — LACTATED RINGERS IV SOLN: INTRAVENOUS | Status: AC

## 2022-06-06 MED ORDER — TAMSULOSIN HCL 0.4 MG PO CAPS
0.4000 mg | ORAL_CAPSULE | Freq: Every day | ORAL | Status: DC
  Administered 2022-06-06 – 2022-06-09 (×4): 0.4 mg via ORAL
  Filled 2022-06-06 (×4): qty 1

## 2022-06-06 MED ORDER — WARFARIN - PHARMACIST DOSING INPATIENT: Freq: Every day | Status: DC

## 2022-06-06 MED ORDER — LATANOPROST 0.005 % OP SOLN
1.0000 [drp] | Freq: Every day | OPHTHALMIC | Status: DC
  Administered 2022-06-06 – 2022-06-09 (×4): 1 [drp] via OPHTHALMIC
  Filled 2022-06-06: qty 2.5

## 2022-06-06 MED ORDER — WARFARIN SODIUM 5 MG PO TABS
10.0000 mg | ORAL_TABLET | ORAL | Status: AC
  Administered 2022-06-06: 10 mg via ORAL
  Filled 2022-06-06: qty 2

## 2022-06-06 NOTE — Assessment & Plan Note (Signed)
Patient was found to have INR of 7 on admission.  Most likely taking Cipro and Coumadin with drug interaction.  S/p vitamin K injection. INR subtherapeutic at 1.6 today.  Patient was restarted on Coumadin without any bridge. -Continue Coumadin per pharmacy -Continue to monitor INR

## 2022-06-06 NOTE — Assessment & Plan Note (Signed)
Started clearing now. pt with recently diagnosed UTI started on Cipro and urinary retention with indwelling foley catheter placed earlier this morning presents with gross hematuria and supratherapeutic INR of 7. Pt on Coumadin for aortic valve replacement. S/p vitamin K with improvement of INR to 2.9 today. Hemoglobin at 9.4 today.  Patient was already started on iron supplement. Discussed with urology on secure chat and hoping it will resolve within next 24 to 48 hours. -Monitor INR and hemoglobin -Transfuse if below 7

## 2022-06-06 NOTE — Assessment & Plan Note (Signed)
Resolved.  Continue to monitor.

## 2022-06-06 NOTE — Assessment & Plan Note (Signed)
-  UA positive with large hgb, moderate leukocytes and negative nitrates. 21-50WBC.  Had symptoms of urinary frequency/urgency and retention.  Denies any abdominal or pelvic/perineal pain-low concerns for prostatitis. Recent urine culture with Klebsiella pneumonia with good sensitivity. -continue Keflex

## 2022-06-06 NOTE — Progress Notes (Signed)
ANTICOAGULATION CONSULT NOTE - Initial Consult  Pharmacy Consult for Coumadin Indication: mechanical AVR  No Known Allergies  Patient Measurements: Height:  (175.3 cm) Weight: 75 kg (165 lb 5.5 oz) IBW/kg (Calculated) : 70.7  Vital Signs: Temp: 99.4 F (37.4 C) (04/25 0850) Temp Source: Oral (04/25 0850) BP: 124/71 (04/25 0850) Pulse Rate: 90 (04/25 0850)  Labs: Recent Labs    06/04/22 1457 06/04/22 1712 06/05/22 0326 06/06/22 0514  HGB 11.2*  --  9.8* 9.4*  HCT 34.7*  --  30.1* 29.2*  PLT 232  --  183 204  LABPROT  --  59.8* 30.1* 18.5*  INR  --  7.0* 2.9* 1.6*  CREATININE 1.76*  --  2.03* 1.53*    Estimated Creatinine Clearance: 43.6 mL/min (A) (by C-G formula based on SCr of 1.53 mg/dL (H)).   Medical History: Past Medical History:  Diagnosis Date   Chronic anticoagulation    HTN (hypertension)    Hypercholesteremia    Severe aortic stenosis    Assessment:  AC/Heme: hx mechanical AVR on warfarin PTA --> on hold, came in with gross hematuria  - PTA dose per [facility] clinic note on 05/20/22: 5 mg daily except 7.5 mg on TThSat (INR goal 2.5-3.5) - hx of chronic prostatitis with indwelling foley catheter.   -4/23:  INR 7, vit K 5 mg IV x1 (poss from PTA Cipro interaction?) - 4/24: INR 2.9 - 4/25: INR 1.6, Hgb 9.4 down. Resume Coumadin. Needs bridging but MD said will not start with continued hematuria/clots for right now.  Goal of Therapy:  INR 2.5-3.5 Monitor platelets by anticoagulation protocol: Yes   Plan:  Coumadin  po x 1 now Daily INR   Dezaria Methot S. Merilynn Finland, PharmD, BCPS Clinical Staff Pharmacist [email or web address] Merilynn Finland, [employer] 06/06/2022,10:29 AM

## 2022-06-06 NOTE — Assessment & Plan Note (Signed)
Appears to have CKD stage IIIa with baseline creatinine at 1.2-1.3 according to Care Everywhere.  Creatinine continues to improve, at 1.53 today -Continue with LR for another 10-hour -avoid nephrotoxic agent -hold home Losartan and HCTZ due to AKI

## 2022-06-06 NOTE — Progress Notes (Signed)
   06/05/22 2100  Urethral Catheter Straight-tip 14 Fr.  Placement Date/Time: 06/04/22 1819   Person Assisting with Catheter Insertion: Dalia Heading  Catheter placed prior to periop?: No  Catheter Type: Straight-tip  Tube Size (Fr.): 14 Fr.  Catheter Balloon Size: 10 mL  Urine Returned: Yes  Patient/Family...  Input (mL) 50 mL  Output (mL) 60 mL  Urine Characteristics  Urine Color Red  Urine Appearance Blood clots  Hygiene Foley care AND Peri care  Stool Characteristics  Bowel Incontinence No   Patient complained of pressure building up in his bladder and has an urge to pee and noticed that his FC is not flowing.  RN irrigated the catheter and got some tiny bits of clots. Patient said he felt relief after. Charge Nurse Erie Noe, RN informed. On call A. Virgel Manifold, NP made aware.

## 2022-06-06 NOTE — Progress Notes (Signed)
Progress Note   Patient: Nathan Frye RUE:[id number] DOB: 1950-11-21 DOA: 06/04/2022     1 DOS: the patient was seen and examined on 06/06/2022   Brief hospital course: Taken from H&P.   CARLON CHALOUX is a 72 y.o. male with medical history significant of aortic valve stenosis s/p TAVR on Coumadin, HTN, HLD, hx of chronic prostatitis who presents with gross hematuria with indwelling foley catheter.   Pt was just evaluated earlier today in ED with urinary retention. Had chills starting last week and then progressively had urinary urgency, frequency, chills and no appetite. He was seen in Urgent care yesterday with urinary retention and diarrhea.He was started on Cipro for positive UA and was took 3 doses so far.However had worsening dribbling and retention and went back to urgent care today and was told to go to ER.  Foley was placed in ED with output. Had mild bleeding at foley catheter at the time thought due to trauma. Antibiotics was switched from Cipro to Keflex to avoid interaction with warfarin. He was then discharged home.    Pt now returns with gross hematuria from foley catheter placed earlier this morning. INR elevated to 7. He did take a dose of warfarin this morning. Previous Hgb checked earlier today at 11.2. Cr with mild AKI at 1.76.    Had discussion with EDP Dr. Anitra Lauth and due to worsening hematuria with supratherpeutic INR will give IV  vitamin K and admit for observation of Hgb and INR.   4/24: Vital stable.  INR improved to 2.9, hemoglobin decreased to 9.8, potassium at 2.5.  Which is being repleted, magnesium  low normal at 1.7 Patient was also started on iron supplement.  Discussed with his urologist Dr. Ronne Binning on secure chat, they would like to observe and hoping that hematuria will improve within next 24 to 48 hours as most likely secondary to supratherapeutic INR.  They will follow-up as outpatient and patient will be going home with Foley  catheter.  4/25: Hemodynamically stable.  Urine started clearing off.  No pain.  INR subtherapeutic at 1.6 today.  He was started back on Coumadin without any bridge due to recent bleed.  Also started on Flomax.  Patient has an appointment with urology on 06/19/2022 for voiding trial. We will keep him for another day and most likely be discharged tomorrow with Foley catheter and Flomax to follow-up with urology as outpatient.   Assessment and Plan: * Hematuria Started clearing now. pt with recently diagnosed UTI started on Cipro and urinary retention with indwelling foley catheter placed earlier this morning presents with gross hematuria and supratherapeutic INR of 7. Pt on Coumadin for aortic valve replacement. S/p vitamin K with improvement of INR to 2.9 today. Hemoglobin at 9.4 today.  Patient was already started on iron supplement. Discussed with urology on secure chat and hoping it will resolve within next 24 to 48 hours. -Monitor INR and hemoglobin -Transfuse if below 7  UTI (urinary tract infection) -UA positive with large hgb, moderate leukocytes and negative nitrates. 21-50WBC.  Had symptoms of urinary frequency/urgency and retention.  Denies any abdominal or pelvic/perineal pain-low concerns for prostatitis. Recent urine culture with Klebsiella pneumonia with good sensitivity. -continue Keflex    AKI (acute kidney injury) Appears to have CKD stage IIIa with baseline creatinine at 1.2-1.3 according to Care Everywhere.  Creatinine continues to improve, at 1.53 today -Continue with LR for another 10-hour -avoid nephrotoxic agent -hold home Losartan and HCTZ due to AKI  Supratherapeutic INR Patient was found to have INR of 7 on admission.  Most likely taking Cipro and Coumadin with drug interaction.  S/p vitamin K injection. INR subtherapeutic at 1.6 today.  Patient was restarted on Coumadin without any bridge. -Continue Coumadin per pharmacy -Continue to monitor INR  H/O aortic  valve replacement -Goal INR of 2.5-3.5 -supratherpeutic INR up to 7  likely due to interaction with antibiotics started for UTI -S/p vitamin K injection with improvement of INR to 2.9 -Coumadin per pharmacy -follow INR daily  HTN (hypertension) -continue metoprolol -hold losartan and HCTZ due to AKI  Hypokalemia Resolved. -Continue to monitor  Hypercholesteremia -continue statin   Subjective: Patient was seen and examined today.  Urine started clearing now.  Denies any pain.  Physical Exam: Vitals:   06/05/22 2045 06/05/22 2046 06/06/22 0439 06/06/22 0850  BP: (!) 98/54 (!) 94/56 126/62 124/71  Pulse: 94 92 81 90  Resp: Temp: 98.6 F (37 C)  99.1 F (37.3 C) 99.4 F (37.4 C)  TempSrc: Oral  Oral Oral  SpO2: 96%  98% 98%  Weight:      Height:       General.  Well-developed gentleman, in no acute distress. Pulmonary.  Lungs clear bilaterally, normal respiratory effort. CV.  Regular rate and rhythm, no JVD, rub or murmur. Abdomen.  Soft, nontender, nondistended, BS positive. CNS.  Alert and oriented .  No focal neurologic deficit. Extremities.  No edema, no cyanosis, pulses intact and symmetrical. Psychiatry.  Judgment and insight appears normal.   Data Reviewed: Prior data reviewed  Family Communication: Discussed with patient.  No family at bedside  Disposition: Status is: Observation The patient will require care spanning > 2 midnights and should be moved to inpatient because: Severity of illness  Planned Discharge Destination: Home  Time spent: 42 minutes  This record has been created using Conservation officer, historic buildings. Errors have been sought and corrected,but may not always be located. Such creation errors do not reflect on the standard of care.   Author: Arnetha Courser, MD 06/06/2022 12:29 PM  For on call review [email or web address].

## 2022-06-07 DIAGNOSIS — R310 Gross hematuria: Secondary | ICD-10-CM

## 2022-06-07 LAB — CBC
HCT: 28.1 % — ABNORMAL LOW (ref 39.0–52.0)
Hemoglobin: 8.9 g/dL — ABNORMAL LOW (ref 13.0–17.0)
MCH: 28.8 pg (ref 26.0–34.0)
MCHC: 31.7 g/dL (ref 30.0–36.0)
MCV: 90.9 fL (ref 80.0–100.0)
Platelets: 218 10*3/uL (ref 150–400)
RBC: 3.09 MIL/uL — ABNORMAL LOW (ref 4.22–5.81)
RDW: 12.2 % (ref 11.5–15.5)
WBC: 12.5 10*3/uL — ABNORMAL HIGH (ref 4.0–10.5)
nRBC: 0 % (ref 0.0–0.2)

## 2022-06-07 LAB — BASIC METABOLIC PANEL
Anion gap: 9 (ref 5–15)
BUN: 18 mg/dL (ref 8–23)
CO2: 26 mmol/L (ref 22–32)
Calcium: 8.7 mg/dL — ABNORMAL LOW (ref 8.9–10.3)
Chloride: 102 mmol/L (ref 98–111)
Creatinine, Ser: 1.21 mg/dL (ref 0.61–1.24)
GFR, Estimated: >60 mL/min (ref >60)
Glucose, Bld: 116 mg/dL — ABNORMAL HIGH (ref 70–99)
Potassium: 3.7 mmol/L (ref 3.5–5.1)
Sodium: 137 mmol/L (ref 135–145)

## 2022-06-07 LAB — PROTIME-INR
INR: 2.2 — ABNORMAL HIGH (ref 0.8–1.2)
Prothrombin Time: 24.4 seconds — ABNORMAL HIGH (ref 11.4–15.2)

## 2022-06-07 MED ORDER — HEPARIN (PORCINE) 25000 UT/250ML-% IV SOLN
1250.0000 [IU]/h | INTRAVENOUS | Status: DC
  Administered 2022-06-07: 1050 [IU]/h via INTRAVENOUS
  Administered 2022-06-08: 1250 [IU]/h via INTRAVENOUS
  Filled 2022-06-07 (×2): qty 250

## 2022-06-07 MED ORDER — PSYLLIUM 95 % PO PACK
1.0000 | PACK | Freq: Every day | ORAL | Status: DC
  Administered 2022-06-07 – 2022-06-10 (×4): 1 via ORAL
  Filled 2022-06-07 (×5): qty 1

## 2022-06-07 MED ORDER — WARFARIN SODIUM 5 MG PO TABS
5.0000 mg | ORAL_TABLET | Freq: Once | ORAL | Status: AC
  Administered 2022-06-07: 5 mg via ORAL
  Filled 2022-06-07: qty 1

## 2022-06-07 NOTE — Plan of Care (Signed)
  Problem: Clinical Measurements: Goal: Diagnostic test results will improve Outcome: Progressing   Problem: Education: Goal: Knowledge of General Education information will improve Description: Including pain rating scale, medication(s)/side effects and non-pharmacologic comfort measures Outcome: Adequate for Discharge   Problem: Activity: Goal: Risk for activity intolerance will decrease Outcome: Adequate for Discharge

## 2022-06-07 NOTE — Progress Notes (Signed)
[facility] PROGRESS NOTE  Nathan Frye (DOB: 15-Mar-1950) ZOX:[id number] PCP: Wilfred Curtis, MD Outpatient Specialists: Urology, Dr. Ronne Binning  Brief Narrative: Nathan Frye is a 72 y.o. male with a history of mechanical AVR on coumadin, recent UTI started on ciprofloxacin, HTN, HLD who presented to the ED on 06/04/2022 with urinary retention, had foley placed and returned later in the day due to hematuria. INR 7.0. Vitamin K was given, coumadin held, and the patient admitted. INR declined and hematuria resolved. Heparin bridge is initiated while restarting coumadin. Antibiotics switched to keflex.   Subjective: Feels much better, no further hematuria, no abd pain. Chills resolved, still low grade fever yesterday morning.   Objective: BP 113/61   Pulse 81   Temp 98.2 F (36.8 C) (Oral)   Resp 20   Ht 5\' 9"  (1.753 m)   Wt 75 kg   SpO2 95%   BMI 24.42 kg/m   Gen: Nontoxic male in no distress Pulm: Clear, nonlabored  CV: RRR with mechanical S1, no MRG, JVD, or edema GI: Soft, NT, ND, +BS Neuro: Alert and oriented. No new focal deficits. Ext: Warm, no deformities. Skin: No new rashes, lesions or ulcers on visualized skin   Assessment & Plan: 72yo male presenting with hematuria, ABLA, supratherapeutic INR in the setting of UTI treated with ciprofloxacin. Has improved/stabilized.   Acute urinary retention:  - Continue foley per discussion with urology at discharge. Started tamsulosin, treating UTI as below. Has follow up appointment 5/8 with Dr. Ronne Binning.   Hematuria: Resolved.  - Monitor closely while starting heparin bridge   ABLA: Hgb slowly trending down/equilibrating. Not at transfusion threshold.  - Continue monitoring until confirmed stable.   Supratherapeutic INR, history of mechanical AVR: Due to ciprofloxacin interaction with coumadin, dose has been stable. Received vitamin K. - Continue coumadin 5mg  per pharmacy aiming for INR 2.5-3.5.  - Given his  mechanical valve, we will start heparin with therapeutic goal 0.3-0.5. Monitor levels serially and daily INR.  UTI:  - Continue keflex based on urine culture data from 4/22 showing Klebsiella resistant only to ampicillin and macrobid. WBC trending down.  AKI on stage IIIa CKD: Improved. Can stop IVF.  - Holding HCTZ, ARB for now.  HTN:  - Continue metoprolol, hold losartan, HCTZ as above for now  Hypokalemia: Resolved.  HLD:  - Statin  Tyrone Nine, MD [facility] [email or web address] 06/07/2022, 3:07 PM

## 2022-06-07 NOTE — Progress Notes (Addendum)
ANTICOAGULATION CONSULT NOTE - Follow Up Consult  Pharmacy Consult for warfarin Indication: mechanical AVR  No Known Allergies  Patient Measurements: Height: 5\' 9"  (175.3 cm) Weight: 75 kg (165 lb 5.5 oz) IBW/kg (Calculated) : 70.7 Heparin Dosing Weight:   Vital Signs: Temp: 98.2 F (36.8 C) (04/26 0541) Temp Source: Oral (04/26 0541) BP: 113/61 (04/26 0913) Pulse Rate: 81 (04/26 0913)  Labs: Recent Labs    06/05/22 0326 06/06/22 0514 06/07/22 0526  HGB 9.8* 9.4* 8.9*  HCT 30.1* 29.2* 28.1*  PLT 183 204 218  LABPROT 30.1* 18.5* 24.4*  INR 2.9* 1.6* 2.2*  CREATININE 2.03* 1.53* 1.21    Estimated Creatinine Clearance: 55.2 mL/min (by C-G formula based on SCr of 1.21 mg/dL).   Medications:  PTA dose per [facility] clinic note on 05/20/22: 5 mg daily except 7.5 mg on TThSat (INR goal 2.5-3.5)  Assessment: Patient is a 72 y.o M with hx chronic prostatitis with indwelling foley catheter and mechanical AVR on warfarin PTA, who presented to the ED on 06/04/22 with gross hematuria.  INR on admission was supra-therapeutic at 7 and this was reversed with vit K 5 mg IV x1.  Of note, he was on cipro for a couple of days PTA for UTI and this can increase INR.  Warfarin held on admission and resumed back on 06/06/22.  Today, 06/07/2022: - INR is sub-therapeutic at 2.2 - hgb down slightly to 8.9, plts ok - urine is documented as clear, yellow - drug- drug intxns: being on abx (keflex) can make pt more sensitive to warfarin - eating 100% of meals  Goal of Therapy:  INR 2.5-3.5 Monitor platelets by anticoagulation protocol: Yes   Plan:  - warfarin 5 mg PO x1 - daily INR - If INR remains subtherapeutic and hematuria has resolved, consider bridging with heparin drip  Quinterious Walraven P 06/07/2022,10:25 AM  ________________________________________ Adden: Pharmacy has been consulted to start heparin drip for bridging until INR is therapeutic - start heparin drip at 1050 units/hr - check  8 hr  heparin level   Dorna Leitz, PharmD, BCPS 06/07/2022 12:00 PM

## 2022-06-08 DIAGNOSIS — R310 Gross hematuria: Secondary | ICD-10-CM | POA: Diagnosis not present

## 2022-06-08 LAB — CBC
HCT: 30.4 % — ABNORMAL LOW (ref 39.0–52.0)
Hemoglobin: 9.5 g/dL — ABNORMAL LOW (ref 13.0–17.0)
MCH: 29.1 pg (ref 26.0–34.0)
MCHC: 31.3 g/dL (ref 30.0–36.0)
MCV: 93.3 fL (ref 80.0–100.0)
Platelets: 341 10*3/uL (ref 150–400)
RBC: 3.26 MIL/uL — ABNORMAL LOW (ref 4.22–5.81)
RDW: 12.3 % (ref 11.5–15.5)
WBC: 12.2 10*3/uL — ABNORMAL HIGH (ref 4.0–10.5)
nRBC: 0 % (ref 0.0–0.2)

## 2022-06-08 LAB — HEPARIN LEVEL (UNFRACTIONATED)
Heparin Unfractionated: <0.1 IU/mL — ABNORMAL LOW (ref 0.30–0.70)
Heparin Unfractionated: 0.23 IU/mL — ABNORMAL LOW (ref 0.30–0.70)

## 2022-06-08 LAB — PROTIME-INR
INR: 2.4 — ABNORMAL HIGH (ref 0.8–1.2)
Prothrombin Time: 26.2 seconds — ABNORMAL HIGH (ref 11.4–15.2)

## 2022-06-08 MED ORDER — WARFARIN SODIUM 5 MG PO TABS
7.5000 mg | ORAL_TABLET | Freq: Once | ORAL | Status: AC
  Administered 2022-06-08: 7.5 mg via ORAL
  Filled 2022-06-08: qty 1

## 2022-06-08 NOTE — Progress Notes (Signed)
Pt stated he observed a small amount bright red blood around the tip of his penis after having a bowel movement. Pt returned to bed no additional bleeding observed. No signs of distress, discomfort, or pain verbalized by patient. Per patient this was a one time occurrence. I encourage pt to notify me of any signs of bleeding. Will continue with plan of care.  

## 2022-06-08 NOTE — Progress Notes (Signed)
[facility] PROGRESS NOTE  Nathan Frye (DOB: [birth year]) HQI:[id number] PCP: Wilfred Curtis, MD Outpatient Specialists: Urology, Dr. Ronne Binning  Brief Narrative: Nathan Frye is a 72 y.o. male with a history of mechanical AVR on coumadin, recent UTI started on ciprofloxacin, HTN, HLD who presented to the ED on 06/04/2022 with urinary retention, had foley placed and returned later in the day due to hematuria. INR 7.0. Vitamin K was given, coumadin held, and the patient admitted. INR declined and hematuria resolved. Heparin bridge was initiated while restarting coumadin. Antibiotics switched to keflex.   Subjective: Had urge to urinate, then leaking around foley, >200cc on bladder scan. Irrigation by RN this AM yielded 2 dark clots and improve UOP, resolution of symptoms. No red blood.   Objective: BP 114/66   Pulse 82   Temp 97.8 F (36.6 C) (Oral)   Resp 18   Ht 5\' 9"  (1.753 m)   Wt 75 kg   SpO2 100%   BMI 24.42 kg/m   Gen: Nontoxic male in no distress Pulm: Clear, nonlabored  CV: RRR with mechanical S2 (erroneously charted as S1 4/26), no MRG, JVD, or edema GI: Soft, NT, ND, +BS Neuro: Alert and oriented. No new focal deficits. Ext: Warm, no deformities. Skin: No bleeding noted.  Assessment & Plan: 72yo male presenting with hematuria, ABLA, supratherapeutic INR in the setting of UTI treated with ciprofloxacin. Has improved/stabilized.   Acute urinary retention:  - Continue foley per discussion with urology at discharge. Started tamsulosin, treating UTI as below. Has follow up appointment 5/8 with Dr. Ronne Binning.  - Irrigate prn (order placed). Given dysfunction today, will continue monitoring as this would have required that the patient return to the ED if it occurred at home. Likely to be stable for DC once no further issues x24 hours.  Hematuria: Resolved, still some clot obstruction at times. - Monitor closely. Continue anticoagulation.  ABLA: Hgb slowly trending  down/equilibrating, now stabilizing ~mid-9's. Not at transfusion threshold.  - Continue monitoring until confirmed stable.   Supratherapeutic INR, history of mechanical AVR: Due to ciprofloxacin interaction with coumadin, dose has been stable. Received vitamin K. - Continue coumadin per pharmacy aiming for INR 2.5-3.5. 7.5mg  dose today, recheck INR in AM. Level is 2.4 this AM, d/w pt will DC heparin. Anticipate return to home PTA dose after discharge since no longer taking cipro.   UTI:  - Continue keflex based on urine culture data from 4/22 showing Klebsiella resistant only to ampicillin and macrobid. WBC trended downward overall.  AKI on stage IIIa CKD: Improved. Can stop IVF.  - Holding HCTZ, ARB for now. Recheck in AM.  HTN:  - Continue metoprolol, hold losartan, HCTZ as above for now  Hypokalemia: Resolved.  HLD:  - Statin  Tyrone Nine, MD [facility] [email or web address] 06/08/2022, 1:43 PM

## 2022-06-08 NOTE — Progress Notes (Signed)
ANTICOAGULATION CONSULT NOTE - Follow Up Consult  Pharmacy Consult for warfarin and heparin Indication: mechanical AVR  No Known Allergies  Patient Measurements: Height: 5\' 9"  (175.3 cm) Weight: 75 kg (165 lb 5.5 oz) IBW/kg (Calculated) : 70.7 Heparin Dosing Weight:   Vital Signs: Temp: 97.8 F (36.6 C) (04/27 0550) Temp Source: Oral (04/27 0550) BP: 114/66 (04/27 0936) Pulse Rate: 82 (04/27 0936)  Labs: Recent Labs    06/06/22 0514 06/07/22 0526 06/07/22 2327 06/08/22 0922  HGB 9.4* 8.9*  --   --   HCT 29.2* 28.1*  --   --   PLT 204 218  --   --   LABPROT 18.5* 24.4*  --   --   INR 1.6* 2.2*  --   --   HEPARINUNFRC  --   --  <0.10* 0.23*  CREATININE 1.53* 1.21  --   --     Estimated Creatinine Clearance: 55.2 mL/min (by C-G formula based on SCr of 1.21 mg/dL).   Medications:  PTA dose per [facility] clinic note on 05/20/22: 5 mg daily except 7.5 mg on TThSat (INR goal 2.5-3.5)  Assessment: Patient is a 72 y.o M with hx chronic prostatitis with indwelling foley catheter and mechanical AVR on warfarin PTA, who presented to the ED on 06/04/22 with gross hematuria.  INR on admission was supra-therapeutic at 7 and this was reversed with vit K 5 mg IV x1.  Of note, he was on cipro for a couple of days PTA for UTI and this can increase INR.  Warfarin held on admission and resumed back on 06/06/22.  Today, 06/08/2022: - heparin level is subtherapeutic at 0.23 after rate increase - INR is up to 2.4 almost at goal - Hgb down slightly to 8.9, plts ok - drug- drug intxns: being on abx (keflex) can make pt more sensitive to warfarin - eating 100% of meals  Goal of Therapy:  INR 2.5-3.5 Monitor platelets by anticoagulation protocol: Yes   Plan:  Stop heparin as INR almost therapeutic Give warfarin 7.5mg  PO x1 Monitor daily INR, CBC, s/s of bleed  Would consider restarting home warfarin dose of 5mg  daily exc 7.5mg  on Tues, Thurs, Sat as long as INR ok tomorrow  Enzo Bi,  PharmD, BCPS, BCIDP Clinical Pharmacist 06/08/2022 11:51 AM

## 2022-06-08 NOTE — Plan of Care (Signed)

## 2022-06-08 NOTE — Progress Notes (Signed)
The patient expressed that urine was seeping around the catheter and that he had a persistent urge to urinate. Bladder scan volume 275 mL. I hand-irrigated foley, resulting in two dark lots this action improved urine output (275 mL) and relieved symptoms. Patient tolerated well. MD and CN notified. No further symptoms reported.

## 2022-06-08 NOTE — Progress Notes (Signed)
Brief Pharmacy Note re: IV heparin  Patient on warfarin PTA for mechanical AVR.  Admitted with hematuria.  IV heparin bridge started earlier today.  Initial heparin level <0.1 on IV heparin 1050 units/hr (goal 0.3-0.5) No bleeding or infusion related issues reported by RN.  Increase IV heparin to 1250 units/hr Recheck heparin level in 8h Monitor closely for s/sx of bleeding  Junita Push, PharmD, BCPS 06/08/2022@12 :37 AM

## 2022-06-09 DIAGNOSIS — R310 Gross hematuria: Secondary | ICD-10-CM | POA: Diagnosis not present

## 2022-06-09 LAB — BASIC METABOLIC PANEL
Anion gap: 11 (ref 5–15)
BUN: 13 mg/dL (ref 8–23)
CO2: 25 mmol/L (ref 22–32)
Calcium: 8.7 mg/dL — ABNORMAL LOW (ref 8.9–10.3)
Chloride: 104 mmol/L (ref 98–111)
Creatinine, Ser: 1.09 mg/dL (ref 0.61–1.24)
GFR, Estimated: >60 mL/min (ref >60)
Glucose, Bld: 112 mg/dL — ABNORMAL HIGH (ref 70–99)
Potassium: 3.9 mmol/L (ref 3.5–5.1)
Sodium: 140 mmol/L (ref 135–145)

## 2022-06-09 LAB — PROTIME-INR
INR: 3.3 — ABNORMAL HIGH (ref 0.8–1.2)
Prothrombin Time: 33.5 seconds — ABNORMAL HIGH (ref 11.4–15.2)

## 2022-06-09 NOTE — Progress Notes (Signed)
ANTICOAGULATION CONSULT NOTE - Follow Up Consult  Pharmacy Consult for warfarin Indication: mechanical AVR  No Known Allergies  Patient Measurements: Height: 5\' 9"  (175.3 cm) Weight: 75 kg (165 lb 5.5 oz) IBW/kg (Calculated) : 70.7  Vital Signs: Temp: 97.5 F (36.4 C) (04/28 0538) Temp Source: Oral (04/28 0538) BP: 127/62 (04/28 0538) Pulse Rate: 75 (04/28 0538)  Labs: Recent Labs    06/07/22 0526 06/07/22 2327 06/08/22 0922 06/09/22 0535  HGB 8.9*  --  9.5*  --   HCT 28.1*  --  30.4*  --   PLT 218  --  341  --   LABPROT 24.4*  --  26.2* 33.5*  INR 2.2*  --  2.4* 3.3*  HEPARINUNFRC  --  <0.10* 0.23*  --   CREATININE 1.21  --   --  1.09    Estimated Creatinine Clearance: 61.3 mL/min (by C-G formula based on SCr of 1.09 mg/dL).  Medications:  PTA dose per [facility] clinic note on 05/20/22: 5 mg daily except 7.5 mg on TThSat (INR goal 2.5-3.5)  Assessment: Patient is a 72 y.o M with hx chronic prostatitis with indwelling foley catheter and mechanical AVR on warfarin PTA, who presented to the ED on 06/04/22 with gross hematuria.  INR on admission was supra-therapeutic at 7 and this was reversed with vit K 5 mg IV x1.  Of note, he was on cipro for a couple of days PTA for UTI and this can increase INR.  Warfarin held on admission and resumed back on 06/06/22.  He also had a recent clinic visit with an elevated INR in early April.  Today, 06/09/2022: - INR up to 3.3 today, therapeutic but is a big jump - Hgb low but stable, plts ok - drug- drug intxns: being on abx (keflex) can make pt more sensitive to warfarin - eating 100% of meals - RN did report a small amount of blood around tip of penis after BM but no further issues observed  Goal of Therapy:  INR 2.5-3.5 Monitor platelets by anticoagulation protocol: Yes   Plan:  Hold warfarin dose today Monitor daily INR, CBC, s/s of bleed  If discharging, would recommend holding warfarin today and reduce dose to 5mg  daily exc  7.5mg  on Saturday until follow up with anticoag clinic due to several recent elevated INRs  Enzo Bi, PharmD, BCPS, BCIDP Clinical Pharmacist 06/09/2022 9:45 AM

## 2022-06-09 NOTE — Progress Notes (Signed)
[facility] PROGRESS NOTE  [name] (DOB: 1950-03-20) JYN:[id number] PCP: Wilfred Curtis, MD Outpatient Specialists: Urology, Dr. Ronne Binning  Brief Narrative: Nathan Frye is a 72 y.o. male with a history of mechanical AVR on coumadin, recent UTI started on ciprofloxacin, HTN, HLD who presented to the ED on 06/04/2022 with urinary retention, had foley placed and returned later in the day due to hematuria. INR 7.0. Vitamin K was given, coumadin held, and the patient admitted. INR declined and hematuria resolved. Heparin bridge was initiated while restarting coumadin. Antibiotics switched to keflex. Voiding trial 4/28.   Subjective: Does not want to be discharged with foley. Feels hot, no bleeding or other complaints.  Objective: BP 127/62 (BP Location: Right Arm)   Pulse 75   Temp (!) 97.5 F (36.4 C) (Oral)   Resp 17   Ht 5\' 9"  (1.753 m)   Wt 75 kg   SpO2 98%   BMI 24.42 kg/m   Gen: No distress Pulm: Clear, nonlabored  CV: RRR, mechanical S2, no edema GI: Soft, NT, ND, +BS  Neuro: Alert and oriented. No new focal deficits. Ext: Warm, no deformities. Skin: No rashes, lesions or ulcers on visualized skin   Assessment & Plan: 72yo male presenting with hematuria, ABLA, supratherapeutic INR in the setting of UTI treated with ciprofloxacin. Has improved/stabilized.   Acute urinary retention:  - Pt wishes to avoid discharge with foley, will perform voiding trial today.  - Continue tamsulosin.  - Has follow up appointment 5/8 with Dr. Ronne Binning.  - Tx UTI as below  Hematuria: Resolved, still some clot obstruction at times. - Monitor closely. Continue anticoagulation.  ABLA: Hgb slowly trending down/equilibrating, now stabilizing ~mid-9's. Not at transfusion threshold.  - Continue monitoring until confirmed stable.   Supratherapeutic INR, history of mechanical AVR: Due to ciprofloxacin interaction with coumadin, dose has been stable. Received vitamin K. - Continue  coumadin per pharmacy aiming for INR 2.5-3.5. Therapeutic INR today without bleeding. Recheck INR in AM.   UTI:  - Continue keflex based on urine culture data from 4/22 showing Klebsiella resistant only to ampicillin and macrobid. WBC trended downward overall. Recheck CBC, if still leukocytosis, may prolong course.  AKI on stage IIIa CKD: Improved. Can stop IVF.  - Holding HCTZ, ARB for now. Recheck in AM.  HTN:  - Continue metoprolol, hold losartan, HCTZ as above for now  Hypokalemia: Resolved.  HLD:  - Statin  Tyrone Nine, MD [facility] [email or web address] 06/09/2022, 11:36 AM

## 2022-06-10 DIAGNOSIS — R310 Gross hematuria: Secondary | ICD-10-CM | POA: Diagnosis not present

## 2022-06-10 LAB — RETICULOCYTES
Immature Retic Fract: 44.5 % — ABNORMAL HIGH (ref 2.3–15.9)
RBC.: 2.85 MIL/uL — ABNORMAL LOW (ref 4.22–5.81)
Retic Count, Absolute: 85.8 10*3/uL (ref 19.0–186.0)
Retic Ct Pct: 3 % (ref 0.4–3.1)

## 2022-06-10 LAB — BASIC METABOLIC PANEL
Anion gap: 7 (ref 5–15)
BUN: 12 mg/dL (ref 8–23)
CO2: 27 mmol/L (ref 22–32)
Calcium: 8.7 mg/dL — ABNORMAL LOW (ref 8.9–10.3)
Chloride: 104 mmol/L (ref 98–111)
Creatinine, Ser: 1.03 mg/dL (ref 0.61–1.24)
GFR, Estimated: >60 mL/min (ref >60)
Glucose, Bld: 106 mg/dL — ABNORMAL HIGH (ref 70–99)
Potassium: 3.6 mmol/L (ref 3.5–5.1)
Sodium: 138 mmol/L (ref 135–145)

## 2022-06-10 LAB — PROTIME-INR
INR: 2.9 — ABNORMAL HIGH (ref 0.8–1.2)
Prothrombin Time: 29.7 seconds — ABNORMAL HIGH (ref 11.4–15.2)

## 2022-06-10 LAB — CBC
HCT: 23.4 % — ABNORMAL LOW (ref 39.0–52.0)
Hemoglobin: 7.3 g/dL — ABNORMAL LOW (ref 13.0–17.0)
MCH: 29.3 pg (ref 26.0–34.0)
MCHC: 31.2 g/dL (ref 30.0–36.0)
MCV: 94 fL (ref 80.0–100.0)
Platelets: 413 10*3/uL — ABNORMAL HIGH (ref 150–400)
RBC: 2.49 MIL/uL — ABNORMAL LOW (ref 4.22–5.81)
RDW: 12.7 % (ref 11.5–15.5)
WBC: 15.2 10*3/uL — ABNORMAL HIGH (ref 4.0–10.5)
nRBC: 0 % (ref 0.0–0.2)

## 2022-06-10 LAB — TYPE AND SCREEN
ABO/RH(D): O POS
Antibody Screen: NEGATIVE

## 2022-06-10 LAB — HEMOGLOBIN AND HEMATOCRIT, BLOOD
HCT: 26.3 % — ABNORMAL LOW (ref 39.0–52.0)
Hemoglobin: 8.3 g/dL — ABNORMAL LOW (ref 13.0–17.0)

## 2022-06-10 LAB — IRON AND TIBC
Iron: 95 ug/dL (ref 45–182)
Saturation Ratios: 36 % (ref 17.9–39.5)
TIBC: 265 ug/dL (ref 250–450)
UIBC: 170 ug/dL

## 2022-06-10 LAB — FERRITIN: Ferritin: 1026 ng/mL — ABNORMAL HIGH (ref 24–336)

## 2022-06-10 LAB — FOLATE: Folate: 9.9 ng/mL (ref >5.9)

## 2022-06-10 LAB — VITAMIN B12: Vitamin B-12: 487 pg/mL (ref 180–914)

## 2022-06-10 MED ORDER — CEPHALEXIN 500 MG PO CAPS: 500.0000 mg | ORAL_CAPSULE | Freq: Two times a day (BID) | ORAL | 0 refills | Status: DC

## 2022-06-10 MED ORDER — WARFARIN SODIUM 5 MG PO TABS: 5.0000 mg | ORAL_TABLET | Freq: Once | ORAL | Status: DC

## 2022-06-10 MED ORDER — TAMSULOSIN HCL 0.4 MG PO CAPS: 0.4000 mg | ORAL_CAPSULE | Freq: Every day | ORAL | 0 refills | Status: DC

## 2022-06-10 MED ORDER — OXYBUTYNIN CHLORIDE 5 MG PO TABS: 5.0000 mg | ORAL_TABLET | Freq: Three times a day (TID) | ORAL | 0 refills | Status: AC

## 2022-06-10 NOTE — Progress Notes (Signed)
Discharge instructions and foley bags given to the patient.All belongings returned to the patient.Foley care education given to the patient.Patient verbalizes understanding and has no questions.Taken to the front entrance in wheelchair.

## 2022-06-10 NOTE — Consult Note (Signed)
Urology Consult   Physician requesting consult: Fulton Reek. Jarvis Newcomer, MD  Reason for consult: hematuria, urinary retention  History of Present Illness: Nathan Frye is a 72 y.o. 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 Urology was consulted for evaluation and management.  Patient presented to ED on 06/04/22 with urinary retention, urgency, and frequency. He was diagnosed with a UTI and started on ciprofloxacin. A foley catheter was placed and patient was discharged from the ED. He thereafter developed gross hematuria per catheter and re-presented to the ED where he was admitted for evaluation. On admission, INR elevated to 7, Hgb 11.2.   Since admission, Hgb has slowly down-trended (7.3-->8.3 this am, no transfusion). His urine has been intermittently bloody per catheter throughout his stay. He reports bothersome bladder spasms with the catheter in place. He underwent TOV yesterday and has since been I/O cathed x2 with residuals 300-400cc. He is on flomax.  For most recent cath, RN reports patient's urine was initially amber colored then very bloody with some clots on the tip of the catheter. Urology placed an 18Fr coude catheter with return of ~100cc amber urine without clot. He was manually irrigated with 200cc sterile saline with no clot return. Catheter was placed to drainage.  Past Medical History:  Diagnosis Date   Chronic anticoagulation    HTN (hypertension)    Hypercholesteremia    Severe aortic stenosis     Past Surgical History:  Procedure Laterality Date   AORTIC VALVE REPLACEMENT  2007   #21 MM [facility] VALVE   VASECTOMY      Current Hospital Medications:  Home Meds:  No current facility-administered medications on file prior to encounter.   Current Outpatient Medications on File Prior to Encounter  Medication Sig Dispense Refill   cephALEXin (KEFLEX)  500 MG capsule Take 1 capsule (500 mg total) by mouth 2 (two) times daily. 14 capsule 0   Ergocalciferol 2000 UNITS TABS Take 2,000 Units by mouth daily.     ferrous sulfate 325 (65 FE) MG EC tablet Take 1 tablet by mouth daily.     hydrochlorothiazide (HYDRODIURIL) 25 MG tablet Take 1 tablet (25 mg total) by mouth daily. 90 tablet 3   latanoprost (XALATAN) 0.005 % ophthalmic solution 1 drop at bedtime.     losartan (COZAAR) 100 MG tablet Take 1 tablet (100 mg total) by mouth daily. 90 tablet 3   metoprolol succinate (TOPROL-XL) 25 MG 24 hr tablet Take 1 tablet (25 mg total) by mouth daily. 90 tablet 3   rosuvastatin (CRESTOR) 10 MG tablet Take 1 tablet (10 mg total) by mouth daily. 90 tablet 2   warfarin (COUMADIN) 5 MG tablet TAKE 1 TO 1 AND 1/2 TABLETS DAILY AS DIRECTED BY COUMADIN CLINIC (Patient taking differently: Take 1-2 tablets by mouth daily. Sunday,Monday,Wednesday and Friday 1 tablet(5mg ) Tuesday Thursday Saturday 1.5 tablet (7.5)) 135 tablet 1     Scheduled Meds:  cephALEXin  500 mg Oral BID   Chlorhexidine Gluconate Cloth  6 each Topical Daily   ferrous sulfate  325 mg Oral Q breakfast   latanoprost  1 drop Both Eyes QHS   metoprolol succinate  25 mg Oral Daily   psyllium  1 packet Oral Daily   rosuvastatin  10 mg Oral Daily   tamsulosin  0.4 mg Oral QPC supper   warfarin  5 mg Oral ONCE-1600  Warfarin - Pharmacist Dosing Inpatient   Does not apply q1600   Continuous Infusions: PRN Meds:.acetaminophen, lip balm  Allergies: No Known Allergies  Family History  Problem Relation Age of Onset   Hypertension Mother     Social History:  reports that he has never smoked. He has never used smokeless tobacco. He reports that he does not drink alcohol and does not use drugs.  ROS: A complete review of systems was performed.  All systems are negative except for pertinent findings as noted.  Physical Exam:  Vital signs in last 24 hours: Temp:  [97.9 F (36.6 C)-98.2 F  (36.8 C)] 98.1 F (36.7 C) (04/29 0400) Pulse Rate:  [81-84] 84 (04/29 0400) Resp:  [16-18] 18 (04/29 0400) BP: (133-154)/(55-73) 141/66 (04/29 0400) SpO2:  [95 %-100 %] 95 % (04/29 0400) Constitutional:  Alert and oriented, No acute distress Cardiovascular: Regular rate and rhythm, No JVD Respiratory: Normal respiratory effort, Lungs clear bilaterally GI: Abdomen is soft, nontender, nondistended, no abdominal masses GU: No CVA tenderness. Foley catheter in place draining amber urine Lymphatic: No lymphadenopathy Neurologic: Grossly intact, no focal deficits Psychiatric: Normal mood and affect  Laboratory Data:  Recent Labs    06/08/22 0922 06/10/22 0554 06/10/22 0804  WBC 12.2* 15.2*  --   HGB 9.5* 7.3* 8.3*  HCT 30.4* 23.4* 26.3*  PLT 341 413*  --     Recent Labs    06/09/22 0535 06/10/22 0554  NA 140 138  K 3.9 3.6  CL 104 104  GLUCOSE 112* 106*  BUN 13 12  CALCIUM 8.7* 8.7*  CREATININE 1.09 1.03     Results for orders placed or performed during the hospital encounter of 06/04/22 (from the past 24 hour(s))  Protime-INR     Status: Abnormal   Collection Time: 06/10/22  5:54 AM  Result Value Ref Range   Prothrombin Time 29.7 (H) 11.4 - 15.2 seconds   INR 2.9 (H) 0.8 - 1.2  CBC     Status: Abnormal   Collection Time: 06/10/22  5:54 AM  Result Value Ref Range   WBC 15.2 (H) 4.0 - 10.5 K/uL   RBC 2.49 (L) 4.22 - 5.81 MIL/uL   Hemoglobin 7.3 (L) 13.0 - 17.0 g/dL   HCT 16.1 (L) 09.6 - 04.5 %   MCV 94.0 80.0 - 100.0 fL   MCH 29.3 26.0 - 34.0 pg   MCHC 31.2 30.0 - 36.0 g/dL   RDW 40.9 81.1 - 91.4 %   Platelets 413 (H) 150 - 400 K/uL   nRBC 0.0 0.0 - 0.2 %  Basic metabolic panel     Status: Abnormal   Collection Time: 06/10/22  5:54 AM  Result Value Ref Range   Sodium 138 135 - 145 mmol/L   Potassium 3.6 3.5 - 5.1 mmol/L   Chloride 104 98 - 111 mmol/L   CO2 27 22 - 32 mmol/L   Glucose, Bld 106 (H) 70 - 99 mg/dL   BUN 12 8 - 23 mg/dL   Creatinine, Ser  7.82 0.61 - 1.24 mg/dL   Calcium 8.7 (L) 8.9 - 10.3 mg/dL   GFR, Estimated >95 >62 mL/min   Anion gap 7 5 - 15  Hemoglobin and hematocrit, blood     Status: Abnormal   Collection Time: 06/10/22  8:04 AM  Result Value Ref Range   Hemoglobin 8.3 (L) 13.0 - 17.0 g/dL   HCT 13.0 (L) 86.5 - 78.4 %  Vitamin B12     Status:  None   Collection Time: 06/10/22  8:04 AM  Result Value Ref Range   Vitamin B-12 487 180 - 914 pg/mL  Folate     Status: None   Collection Time: 06/10/22  8:04 AM  Result Value Ref Range   Folate 9.9 >5.9 ng/mL  Iron and TIBC     Status: None   Collection Time: 06/10/22  8:04 AM  Result Value Ref Range   Iron 95 45 - 182 ug/dL   TIBC 161 096 - 045 ug/dL   Saturation Ratios 36 17.9 - 39.5 %   UIBC 170 ug/dL  Ferritin     Status: Abnormal   Collection Time: 06/10/22  8:04 AM  Result Value Ref Range   Ferritin 1,026 (H) 24 - 336 ng/mL  Reticulocytes     Status: Abnormal   Collection Time: 06/10/22  8:04 AM  Result Value Ref Range   Retic Ct Pct 3.0 0.4 - 3.1 %   RBC. 2.85 (L) 4.22 - 5.81 MIL/uL   Retic Count, Absolute 85.8 19.0 - 186.0 K/uL   Immature Retic Fract 44.5 (H) 2.3 - 15.9 %  Type and screen [facility]     Status: None   Collection Time: 06/10/22  8:04 AM  Result Value Ref Range   ABO/RH(D) O POS    Antibody Screen NEG    Sample Expiration      06/13/2022,2359 Performed at [facility], [address] [place], [place] [address]    No results found for this or any previous visit (from the past 240 hour(s)).  Renal Function: Recent Labs    06/04/22 1457 06/05/22 0326 06/06/22 0514 06/07/22 0526 06/09/22 0535 06/10/22 0554  CREATININE 1.76* 2.03* 1.53* 1.21 1.09 1.03   Estimated Creatinine Clearance: 64.8 mL/min (by C-G formula based on SCr of 1.03 mg/dL).  Radiologic Imaging: No results found.  I independently reviewed the above imaging studies.  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 Now s/p failed TOV with catheter replacement by Urology on 06/10/22. Hematuria has essentially resolved.  #Gross hematuria, resolving - Likely multifactorial in the setting of UTI, BPH, chronic prostatitis - Continue foley catheter to drainage and monitor quality of urine.  - Continue targeted antibiotics through TOV on 06/19/22 (see below) - Patient instructed to observe for development of visible clots or worsening hematuria, call or present to ED for issues - Can discuss gross hematuria work-up in more detail at patient's follow-up, would include consideration for cystoscopy + CTU  #Urinary retention - TOV 06/09/22, required I/O cath x2 - 18Fr coude placed 06/10/22 - Continue foley catheter until follow-up with Dr. Ronne Binning on 06/19/22 - Flomax 0.4mg  qHS - Ditropan 5mg  TID for bladder spasms; discussed side-effects including but not limited to dry eye, dry mouth, confusion  Carlus Pavlov 06/10/2022, 11:13 AM

## 2022-06-10 NOTE — Plan of Care (Signed)
  Problem: Education: Goal: Knowledge of General Education information will improve Description: Including pain rating scale, medication(s)/side effects and non-pharmacologic comfort measures Outcome: Progressing   Problem: Clinical Measurements: Goal: Ability to maintain clinical measurements within normal limits will improve Outcome: Progressing Goal: Will remain free from infection Outcome: Progressing Goal: Cardiovascular complication will be avoided Outcome: Progressing   Problem: Activity: Goal: Risk for activity intolerance will decrease Outcome: Progressing   Problem: Nutrition: Goal: Adequate nutrition will be maintained Outcome: Progressing   Problem: Coping: Goal: Level of anxiety will decrease Outcome: Progressing   Problem: Safety: Goal: Ability to remain free from injury will improve Outcome: Progressing   Problem: Skin Integrity: Goal: Risk for impaired skin integrity will decrease Outcome: Progressing

## 2022-06-10 NOTE — Discharge Summary (Signed)
Physician Discharge Summary   Patient: Nathan Frye MRN: [id number] DOB: [birth year]  Admit date:     06/04/2022  Discharge date: 06/10/22  Discharge Physician: Tyrone Nine   PCP: Wilfred Curtis, MD   Recommendations at discharge:  Follow up with CBC and PT/INR check as scheduled 5/1. Discharged on home dose coumadin.  Follow up with urology as scheduled 5/8, discharged with foley (reinserted 4/29), flomax and ditropan (ditropan to be stopped 2 days prior to voiding trial appt 5/8).   Discharge Diagnoses: Principal Problem:   Hematuria Active Problems:   UTI (urinary tract infection)   AKI (acute kidney injury) (HCC)   Supratherapeutic INR   H/O aortic valve replacement   HTN (hypertension)   Hypokalemia   Hypercholesteremia  Hospital Course: Nathan Frye is a 72 y.o. male with a history of mechanical AVR on coumadin, recent UTI started on ciprofloxacin, HTN, HLD who presented to the ED on 06/04/2022 with urinary retention, had foley placed and returned later in the day due to hematuria. INR 7.0. Vitamin K was given, coumadin held, and the patient admitted. INR declined and hematuria resolved. Heparin bridge was initiated while restarting coumadin. Antibiotics switched to keflex based on urine culture data. Voiding trial 4/28 was unsuccessful, requiring I/O caths and ultimately replacement of foley by urology 4/29. They are recommending discharge with foley and follow up in place. Please see below for details.    Assessment and Plan: 72yo male presenting with hematuria, ABLA, supratherapeutic INR in the setting of UTI treated with ciprofloxacin. Has improved/stabilized.    Acute urinary retention:  - Pt wishes to avoid discharge with foley though failed repeat TOV on 4/28, had foley placed again 4/29 by urology.   - Continue tamsulosin. - Will prescribe ditropan 5mg  TID to control bladder spasms per discussion with urology. Discussed with patient to make sure he stops this 2  days prior to office visit.  - Has follow up appointment 5/8 with Dr. Ronne Binning.  - Tx UTI as below   Hematuria: Resolved, some limited bleeding during I/O caths 4/28 but has resolved at time of discharge.  - Monitor closely. Continue anticoagulation and iron supplementation.   ABLA: Hgb slowly trending down/equilibrating, has stabilized > 8g/dl and has no ongoing bleeding.  - Recheck CBC at follow up.    Supratherapeutic INR, history of mechanical AVR: Due to ciprofloxacin interaction with coumadin, dose has been stable. Received vitamin K. - Continue coumadin per routine dosing, goal INR 2.5-3.5. Remains therapeutic and no bleeding.   UTI:  - Continue keflex based on urine culture data (accessed in Care Everywhere) from 4/22 showing Klebsiella resistant only to ampicillin and macrobid. Will continue this course until at least office visit 5/8.    AKI on stage IIIa CKD: Improved. CrCl currently 15ml/min, so may need amendment of CKD staging depending on follow up.   HTN:  - Continue home medications   Hypokalemia: Resolved.   HLD:  - Statin   Consultants: Urology Procedures performed: Foley catheter placement last 4/29  Disposition: Home Diet recommendation: Regular DISCHARGE MEDICATION: Allergies as of 06/10/2022   No Known Allergies      Medication List     STOP taking these medications    ciprofloxacin 250 MG tablet Commonly known as: CIPRO       TAKE these medications    cephALEXin 500 MG capsule Commonly known as: KEFLEX Take 1 capsule (500 mg total) by mouth 2 (two) times daily.   Ergocalciferol 50 MCG (  2000 UT) Tabs Take 2,000 Units by mouth daily.   ferrous sulfate 325 (65 FE) MG EC tablet Take 1 tablet by mouth daily.   hydrochlorothiazide 25 MG tablet Commonly known as: HYDRODIURIL Take 1 tablet (25 mg total) by mouth daily.   latanoprost 0.005 % ophthalmic solution Commonly known as: XALATAN 1 drop at bedtime.   losartan 100 MG  tablet Commonly known as: COZAAR Take 1 tablet (100 mg total) by mouth daily.   metoprolol succinate 25 MG 24 hr tablet Commonly known as: TOPROL-XL Take 1 tablet (25 mg total) by mouth daily.   oxybutynin 5 MG tablet Commonly known as: DITROPAN Take 1 tablet (5 mg total) by mouth 3 (three) times daily for 7 days.   rosuvastatin 10 MG tablet Commonly known as: CRESTOR Take 1 tablet (10 mg total) by mouth daily.   tamsulosin 0.4 MG Caps capsule Commonly known as: FLOMAX Take 1 capsule (0.4 mg total) by mouth daily after supper.   warfarin 5 MG tablet Commonly known as: COUMADIN Take as directed. If you are unsure how to take this medication, talk to your nurse or doctor. Original instructions: TAKE 1 TO 1 AND 1/2 TABLETS DAILY AS DIRECTED BY COUMADIN CLINIC What changed: See the new instructions.        Follow-up Information     Wilfred Curtis, MD Follow up.   Specialty: Internal Medicine Contact information: [address] [place] [address] [phone]         Malen Gauze, MD Follow up on 06/19/2022.   Specialty: Urology Contact information: [address] [place] [address] [phone]                Discharge Exam: Ceasar Mons Weights   06/04/22 2054  Weight: 75 kg  BP (!) 141/66 (BP Location: Right Arm)   Pulse 84   Temp 98.1 F (36.7 C)   Resp 18   Ht 5\' 9"  (1.753 m)   Wt 75 kg   SpO2 95%   BMI 24.42 kg/m   No distress Yellow urine draining from foley without gross hematuria  Condition at discharge: stable  The results of significant diagnostics from this hospitalization (including imaging, microbiology, ancillary and laboratory) are listed below for reference.   Imaging Studies: No results found.  Microbiology: Results for orders placed or performed in visit on 12/18/21  Microscopic Examination     Status: None   Collection Time: 12/18/21 11:18 AM   Urine  Result Value Ref Range Status   WBC, UA 0-5 0 - 5  /hpf Final   RBC, Urine 0-2 0 - 2 /hpf Final   Epithelial Cells (non renal) None seen 0 - 10 /hpf Final   Bacteria, UA None seen None seen/Few Final    Labs: CBC: Recent Labs  Lab 06/05/22 0326 06/06/22 0514 06/07/22 0526 06/08/22 0922 06/10/22 0554 06/10/22 0804  WBC 10.4 13.5* 12.5* 12.2* 15.2*  --   HGB 9.8* 9.4* 8.9* 9.5* 7.3* 8.3*  HCT 30.1* 29.2* 28.1* 30.4* 23.4* 26.3*  MCV 89.1 90.7 90.9 93.3 94.0  --   PLT 183 204 218 341 413*  --    Basic Metabolic Panel: Recent Labs  Lab 06/05/22 0326 06/06/22 0514 06/07/22 0526 06/09/22 0535 06/10/22 0554  NA 136 137 137 140 138  K 2.5* 3.7 3.7 3.9 3.6  CL 101 102 102 104 104  CO2 23 26 26 25 27   GLUCOSE 115* 124* 116* 112* 106*  BUN 29* 27*  18 13 12   CREATININE 2.03* 1.53* 1.21 1.09 1.03  CALCIUM 8.2* 8.6* 8.7* 8.7* 8.7*  MG 1.7  --   --   --   --    Liver Function Tests: No results for input(s): "AST", "ALT", "ALKPHOS", "BILITOT", "PROT", "ALBUMIN" in the last 168 hours. CBG: No results for input(s): "GLUCAP" in the last 168 hours.  Discharge time spent: greater than 30 minutes.  Signed: Tyrone Nine, MD [facility] 06/10/2022

## 2022-06-10 NOTE — Progress Notes (Signed)
ANTICOAGULATION CONSULT NOTE - Follow Up Consult  Pharmacy Consult for warfarin Indication: mechanical AVR  No Known Allergies  Patient Measurements: Height: 5\' 9"  (175.3 cm) Weight: 75 kg (165 lb 5.5 oz) IBW/kg (Calculated) : 70.7  Vital Signs: Temp: 98.1 F (36.7 C) (04/29 0400) BP: 141/66 (04/29 0400) Pulse Rate: 84 (04/29 0400)  Labs: Recent Labs    06/07/22 2327 06/08/22 0922 06/08/22 0922 06/09/22 0535 06/10/22 0554 06/10/22 0804  HGB  --  9.5*   < >  --  7.3* 8.3*  HCT  --  30.4*  --   --  23.4* 26.3*  PLT  --  341  --   --  413*  --   LABPROT  --  26.2*  --  33.5* 29.7*  --   INR  --  2.4*  --  3.3* 2.9*  --   HEPARINUNFRC <0.10* 0.23*  --   --   --   --   CREATININE  --   --   --  1.09 1.03  --    < > = values in this interval not displayed.    Estimated Creatinine Clearance: 64.8 mL/min (by C-G formula based on SCr of 1.03 mg/dL).  Medications:  PTA dose per [facility] clinic note on 05/20/22: 5 mg daily except 7.5 mg on TThSat (INR goal 2.5-3.5)  Assessment: Patient is a 72 y.o M with hx chronic prostatitis with indwelling foley catheter and mechanical AVR on warfarin PTA, who presented to the ED on 06/04/22 with gross hematuria.  INR on admission was supra-therapeutic at 7 and this was reversed with vit K 5 mg IV x1.  Of note, he was on cipro for a couple of days PTA for UTI and this can increase INR.  Warfarin held on admission and resumed back on 06/06/22.  He also had a recent clinic visit with an elevated INR in early April.  Today, 06/10/2022: - INR 2.9, therapeutic - Hgb low but stable, plts ok - drug- drug intxns: being on abx (keflex) can make pt more sensitive to warfarin - eating 100% of meals - RN did report a small amount of blood around tip of penis 4/27 PM.  No more reported  Goal of Therapy:  INR 2.5-3.5 Monitor platelets by anticoagulation protocol: Yes   Plan:  Warfarin 5 mg po x 1 dose today Monitor daily INR, CBC, s/s of bleed  Herby Abraham, Pharm.D Use secure chat for questions 06/10/2022 10:29 AM

## 2022-06-10 NOTE — Care Management Important Message (Signed)
Important Message  Patient Details IM Letter given. Name: Nathan Frye MRN: [id number] Date of Birth: 1950-06-22   Medicare Important Message Given:  Yes     Caren Macadam 06/10/2022, 2:14 PM

## 2022-06-12 ENCOUNTER — Other Ambulatory Visit: Payer: Medicare Other

## 2022-06-13 LAB — PSA: Prostate Specific Ag, Serum: 40.9 ng/mL — ABNORMAL HIGH (ref 0.0–4.0)

## 2022-06-14 ENCOUNTER — Ambulatory Visit: Payer: Medicare Other | Attending: Cardiovascular Disease | Admitting: *Deleted

## 2022-06-14 DIAGNOSIS — Z5181 Encounter for therapeutic drug level monitoring: Secondary | ICD-10-CM | POA: Insufficient documentation

## 2022-06-14 LAB — POCT INR: POC INR: 1.9

## 2022-06-14 NOTE — Patient Instructions (Signed)
Description   Take 2 tablets of warfarin today and then continue to take warfarin 1 tablet daily except for 1.5 tablets on Tuesday, Thursday and Saturday. Recheck INR in 1 week .  Call us with any medication changes or concerns # [phone] [facility] Main # [phone], Fax #[phone] or [phone]

## 2022-06-19 ENCOUNTER — Ambulatory Visit (INDEPENDENT_AMBULATORY_CARE_PROVIDER_SITE_OTHER): Payer: Medicare Other | Admitting: Urology

## 2022-06-19 VITALS — BP 129/70 | HR 81

## 2022-06-19 DIAGNOSIS — R339 Retention of urine, unspecified: Secondary | ICD-10-CM | POA: Diagnosis not present

## 2022-06-19 DIAGNOSIS — R3915 Urgency of urination: Secondary | ICD-10-CM

## 2022-06-19 DIAGNOSIS — N3000 Acute cystitis without hematuria: Secondary | ICD-10-CM

## 2022-06-19 DIAGNOSIS — N411 Chronic prostatitis: Secondary | ICD-10-CM

## 2022-06-19 DIAGNOSIS — R972 Elevated prostate specific antigen [PSA]: Secondary | ICD-10-CM

## 2022-06-19 LAB — BLADDER SCAN AMB NON-IMAGING: Scan Result: 330

## 2022-06-19 MED ORDER — SILODOSIN 8 MG PO CAPS: 8.0000 mg | ORAL_CAPSULE | Freq: Every evening | ORAL | 11 refills | Status: DC

## 2022-06-19 MED ORDER — CIPROFLOXACIN HCL 500 MG PO TABS
500.0000 mg | ORAL_TABLET | Freq: Once | ORAL | Status: DC
Start: 2022-06-19 — End: 2022-06-19

## 2022-06-19 NOTE — Progress Notes (Signed)
Simple Catheter Placement  Due to urinary retention patient is present today for a foley cath placement.  Patient was cleaned and prepped in a sterile fashion with betadine. A 18  FR coude catheter was inserted, urine return was noted  400 ml, urine was yellow in color.  The balloon was filled with 10cc of sterile water.  A overnight bag was attached for drainage. Patient was also given a night bag to take home and was given instruction on how to change from one bag to another.  Patient was given instruction on proper catheter care.  Patient tolerated well, no complications were noted   Performed by: Kennyth Lose, CMA  Additional notes/ Follow up: No follow-ups on file.

## 2022-06-19 NOTE — Progress Notes (Signed)
post void residual=330 ?

## 2022-06-19 NOTE — Progress Notes (Addendum)
Fill and Pull Catheter Removal  Patient is present today for a catheter removal.  Patient was cleaned and prepped in a sterile fashion 105 ml of sterile water/ saline was instilled into the bladder when the patient felt the urge to urinate. 10 ml of water was then drained from the balloon.  A 18 FR  coude  cath was removed from the bladder no complications were noted .  Patient as then given some time to void on their own.  Patient cannot void  multiple bladder spasm and patient passed blood clot on their own after some time.  Patient tolerated well.  Performed by: Kennyth Lose, CMA  Follow up/ Additional notes: PVR @2  pm

## 2022-06-21 ENCOUNTER — Ambulatory Visit: Payer: Medicare Other | Attending: Cardiology | Admitting: *Deleted

## 2022-06-21 DIAGNOSIS — Z5181 Encounter for therapeutic drug level monitoring: Secondary | ICD-10-CM

## 2022-06-21 LAB — POCT INR: POC INR: 3.5

## 2022-06-21 NOTE — Patient Instructions (Signed)
Description   Continue to take warfarin 1 tablet daily except for 1.5 tablets on Tuesday, Thursday and Saturday. Recheck INR in 2 weeks. ( Usually checks INR every 6 weeks).  Call us with any medication changes or concerns # [phone] [facility] Main # [phone], Fax #[phone] or [phone]

## 2022-06-24 ENCOUNTER — Ambulatory Visit: Payer: Medicare Other | Admitting: Urology

## 2022-06-25 ENCOUNTER — Encounter: Payer: Self-pay | Admitting: Urology

## 2022-06-25 NOTE — Patient Instructions (Signed)
Acute Urinary Retention, Male  Acute urinary retention is a condition in which a person is unable to pass urine or can only pass a little urine. This condition can happen suddenly and last for a short time. If left untreated, it can become long-term (chronic) and result in kidney damage or other serious complications. What are the causes? This condition may be caused by: Obstruction or narrowing of the tube that drains the bladder (urethra). This may be caused by surgery, problems with nearby organs, or injury to the bladder or urethra. Problems with the nerves in the bladder. Tumors in the area of the pelvis, bladder, or urethra. Certain medicines. Bladder or urinary tract infection. Constipation. What increases the risk? This condition is more likely to develop in older men. As men age, their prostate may become larger and may start to press or squeeze on the bladder or the urethra. Other chronic health conditions can increase the risk of acute urinary retention. These include: Diseases such as multiple sclerosis. Spinal cord injuries. Diabetes. Degenerative cognitive conditions, such as delirium or dementia. Psychological conditions. A man may hold his urine due to trauma or because he does not want to use the bathroom. What are the signs or symptoms? Symptoms of this condition include: Trouble urinating. Pain in the lower abdomen. How is this diagnosed? This condition is diagnosed based on a physical exam and your medical history. You may also have other tests, including: An ultrasound of the bladder or kidneys or both. Blood tests. A urine analysis. Additional tests may be needed, such as a CT scan, MRI, and kidney or bladder function tests. How is this treated? Treatment for this condition may include: Medicines. Placing a thin, sterile tube (catheter) into the bladder to drain urine out of the body. This is called an indwelling urinary catheter. After it is inserted, the  catheter is held in place with a small balloon that is filled with sterile water. Urine drains from the catheter into a collection bag outside of the body. Behavioral therapy. Treatment for other conditions. If needed, you may be treated in the hospital for kidney function problems or to manage other complications. Follow these instructions at home: Medicines Take over-the-counter and prescription medicines only as told by your health care provider. Avoid certain medicines, such as decongestants, antihistamines, and some prescription medicines. Do not take any medicine unless your health care provider approves. If you were prescribed an antibiotic medicine, take it as told by your health care provider. Do not stop using the antibiotic even if you start to feel better. General instructions Do not use any products that contain nicotine or tobacco. These products include cigarettes, chewing tobacco, and vaping devices, such as e-cigarettes. If you need help quitting, ask your health care provider. Drink enough fluid to keep your urine pale yellow. If you have an indwelling urinary catheter, follow the instructions from your health care provider. Monitor any changes in your symptoms. Tell your health care provider about any changes. If instructed, monitor your blood pressure at home. Report changes as told by your health care provider. Keep all follow-up visits. This is important. Contact a health care provider if: You have uncomfortable bladder contractions that you cannot control (spasms). You leak urine with the spasms. Get help right away if: You have chills or a fever. You have blood in your urine. You have a catheter and the following happens: Your catheter stops draining urine. Your catheter falls out. Summary Acute urinary retention is a condition in   which a person is unable to pass urine or can only pass a little urine. If left untreated, this condition can result in kidney damage or  other serious complications. An enlarged prostate may cause this condition. As men age, their prostate gland may become larger and may press or squeeze on the bladder or the urethra. Treatment for this condition may include medicines and placement of an indwelling urinary catheter. Monitor any changes in your symptoms. Tell your health care provider about any changes. This information is not intended to replace advice given to you by your health care provider. Make sure you discuss any questions you have with your health care provider. Document Revised: 10/20/2019 Document Reviewed: 10/20/2019 Elsevier Patient Education  2023 Elsevier Inc.  

## 2022-06-25 NOTE — Progress Notes (Signed)
06/19/2022 8:53 AM   Nathan Frye 1950/07/31 [id number]  Referring provider: Wilfred Curtis, MD [address] [place],  [place] [address]  Urinary retention   HPI: [name] is a 72yo here for evaluation of gross hematuria and urinary retention. He was admitted 2 weeks ago with a supratheraputic INR, gross hematuria, and urinary retention. Foley was placed at that time and patient was discharged home. He presents  today for voiding trial. Voiding trial failed today.    PMH: Past Medical History:  Diagnosis Date   Chronic anticoagulation    HTN (hypertension)    Hypercholesteremia    Severe aortic stenosis     Surgical History: Past Surgical History:  Procedure Laterality Date   AORTIC VALVE REPLACEMENT  2007   #21 MM [facility] VALVE   VASECTOMY      Home Medications:  Allergies as of 06/19/2022   No Known Allergies      Medication List        Accurate as of Jun 19, 2022 11:59 PM. If you have any questions, ask your nurse or doctor.          cephALEXin 500 MG capsule Commonly known as: KEFLEX Take 1 capsule (500 mg total) by mouth 2 (two) times daily.   Ergocalciferol 50 MCG (2000 UT) Tabs Take 2,000 Units by mouth daily.   ferrous sulfate 325 (65 FE) MG EC tablet Take 1 tablet by mouth daily.   hydrochlorothiazide 25 MG tablet Commonly known as: HYDRODIURIL Take 1 tablet (25 mg total) by mouth daily.   latanoprost 0.005 % ophthalmic solution Commonly known as: XALATAN 1 drop at bedtime.   losartan 100 MG tablet Commonly known as: COZAAR Take 1 tablet (100 mg total) by mouth daily.   metoprolol succinate 25 MG 24 hr tablet Commonly known as: TOPROL-XL Take 1 tablet (25 mg total) by mouth daily.   rosuvastatin 10 MG tablet Commonly known as: CRESTOR Take 1 tablet (10 mg total) by mouth daily.   silodosin 8 MG Caps capsule Commonly known as: RAPAFLO Take 1 capsule (8 mg total) by mouth at bedtime.   tamsulosin 0.4 MG Caps  capsule Commonly known as: FLOMAX Take 1 capsule (0.4 mg total) by mouth daily after supper.   warfarin 5 MG tablet Commonly known as: COUMADIN Take as directed by the anticoagulation clinic. If you are unsure how to take this medication, talk to your nurse or doctor. Original instructions: TAKE 1 TO 1 AND 1/2 TABLETS DAILY AS DIRECTED BY COUMADIN CLINIC What changed: See the new instructions.        Allergies: No Known Allergies  Family History: Family History  Problem Relation Age of Onset   Hypertension Mother     Social History:  reports that he has never smoked. He has never used smokeless tobacco. He reports that he does not drink alcohol and does not use drugs.  ROS: All other review of systems were reviewed and are negative except what is noted above in HPI  Physical Exam: BP 129/70   Pulse 81   Constitutional:  Alert and oriented, No acute distress. HEENT: [place] AT, moist mucus membranes.  Trachea midline, no masses. Cardiovascular: No clubbing, cyanosis, or edema. Respiratory: Normal respiratory effort, no increased work of breathing. GI: Abdomen is soft, nontender, nondistended, no abdominal masses GU: No CVA tenderness.  Lymph: No cervical or inguinal lymphadenopathy. Skin: No rashes, bruises or suspicious lesions. Neurologic: Grossly intact, no focal deficits, moving all 4 extremities. Psychiatric:  Normal mood and affect.  Laboratory Data: Lab Results  Component Value Date   WBC 15.2 (H) 06/10/2022   HGB 8.3 (L) 06/10/2022   HCT 26.3 (L) 06/10/2022   MCV 94.0 06/10/2022   PLT 413 (H) 06/10/2022    Lab Results  Component Value Date   CREATININE 1.03 06/10/2022    No results found for: "PSA"  No results found for: "TESTOSTERONE"  No results found for: "HGBA1C"  Urinalysis    Component Value Date/Time   COLORURINE YELLOW 06/04/2022 1607   APPEARANCEUR HAZY (A) 06/04/2022 1607   APPEARANCEUR Clear 12/18/2021 1118   LABSPEC 1.012 06/04/2022  1607   PHURINE 5.0 06/04/2022 1607   GLUCOSEU NEGATIVE 06/04/2022 1607   HGBUR LARGE (A) 06/04/2022 1607   BILIRUBINUR NEGATIVE 06/04/2022 1607   BILIRUBINUR Negative 12/18/2021 1118   KETONESUR NEGATIVE 06/04/2022 1607   PROTEINUR 30 (A) 06/04/2022 1607   NITRITE NEGATIVE 06/04/2022 1607   LEUKOCYTESUR MODERATE (A) 06/04/2022 1607    Lab Results  Component Value Date   LABMICR See below: 12/18/2021   WBCUA 0-5 12/18/2021   LABEPIT None seen 12/18/2021   BACTERIA NONE SEEN 06/04/2022    Pertinent Imaging:  No results found for this or any previous visit.  No results found for this or any previous visit.  No results found for this or any previous visit.  No results found for this or any previous visit.  No results found for this or any previous visit.  No valid procedures specified. No results found for this or any previous visit.  No results found for this or any previous visit.   Assessment & Plan:    1. Acute cystitis without hematuria -replace foley - Bladder Voiding Trial - INSERT,TEMP INDWELLING BLAD CATH,SIMPLE  2. Urinary retention Foley replaced today. We will start rapaflo 8mg  daily. Followup 1 week for voiding trial. - BLADDER SCAN AMB NON-IMAGING - INSERT,TEMP INDWELLING BLAD CATH,SIMPLE   No follow-ups on file.  Nathan Aye, MD  [facility] [place]

## 2022-06-26 ENCOUNTER — Ambulatory Visit (INDEPENDENT_AMBULATORY_CARE_PROVIDER_SITE_OTHER): Payer: Medicare Other | Admitting: Urology

## 2022-06-26 VITALS — BP 149/63 | HR 73

## 2022-06-26 DIAGNOSIS — R339 Retention of urine, unspecified: Secondary | ICD-10-CM | POA: Diagnosis not present

## 2022-06-26 LAB — BLADDER SCAN AMB NON-IMAGING

## 2022-06-26 MED ORDER — CIPROFLOXACIN HCL 500 MG PO TABS
500.0000 mg | ORAL_TABLET | Freq: Once | ORAL | Status: AC
Start: 2022-06-26 — End: 2022-06-26
  Administered 2022-06-26: 500 mg via ORAL

## 2022-06-26 NOTE — Progress Notes (Signed)
Fill and Pull Catheter Removal  Patient is present today for a catheter removal.  Patient was cleaned and prepped in a sterile fashion of sterile water/ saline was instilled into the bladder when the patient felt the urge to urinate. 10ml of water was then drained from the balloon.  A 16FR coude foley cath was removed from the bladder no complications were noted .  Patient had bladder spasms during the procedure so we were unable to determine output amount.  Patient tolerated well.  Performed by: Cira Rue CMA/ Guss Bunde, CMA  Follow up/ Additional notes: MD to see after

## 2022-06-26 NOTE — Progress Notes (Signed)
Simple Catheter Placement  Due to urinary retention patient is present today for a foley cath placement.  Patient was cleaned and prepped in a sterile fashion with betadine. A 16 FR coude foley catheter was inserted, urine return was noted  , urine was yellow in color.  The balloon was filled with 10cc of sterile water.  A bedside bag was attached for drainage.  Patient was given instruction on proper catheter care.  Patient tolerated well, no complications were noted   Performed by: Luismiguel Lamere LPN  Additional notes/ Follow up: Per MD note

## 2022-06-27 ENCOUNTER — Telehealth: Payer: Self-pay

## 2022-06-27 NOTE — Telephone Encounter (Signed)
Patient has questions and needing to discuss the latest medication Cepro.    Please advise.  Thank you.

## 2022-06-27 NOTE — Telephone Encounter (Signed)
Returned call to patient. He states he does not want to take Cipro anymore do to an interaction he had while in the hospital at [facility] with the coumadin he is chronically taking. Patient request that a note be placed in his chart to not give him Cipro. Patient was given a Cipro yesterday in office and states he has not had any reaction thus far but he doesn't want to take anymore.

## 2022-07-05 ENCOUNTER — Ambulatory Visit: Payer: Medicare Other | Attending: Cardiology | Admitting: *Deleted

## 2022-07-05 DIAGNOSIS — Z5181 Encounter for therapeutic drug level monitoring: Secondary | ICD-10-CM | POA: Diagnosis present

## 2022-07-05 DIAGNOSIS — Z952 Presence of prosthetic heart valve: Secondary | ICD-10-CM | POA: Diagnosis present

## 2022-07-05 LAB — POCT INR: INR: 4.1 — AB (ref 2.0–3.0)

## 2022-07-05 NOTE — Patient Instructions (Addendum)
Description   Do not take any warfarin tomorrow then continue taking warfarin 1 tablet daily except for 1.5 tablets on Tuesday, Thursday, and Saturday. Recheck INR in 2 weeks. (Usually checks INR every 6 weeks). Call us with any medication changes or concerns # [phone] or [phone] Clinic, Main # [phone], Fax #[phone] or [phone]

## 2022-07-10 ENCOUNTER — Ambulatory Visit (INDEPENDENT_AMBULATORY_CARE_PROVIDER_SITE_OTHER): Payer: Medicare Other | Admitting: Urology

## 2022-07-10 VITALS — BP 158/68 | HR 70

## 2022-07-10 DIAGNOSIS — R310 Gross hematuria: Secondary | ICD-10-CM | POA: Diagnosis not present

## 2022-07-10 DIAGNOSIS — R339 Retention of urine, unspecified: Secondary | ICD-10-CM | POA: Diagnosis not present

## 2022-07-10 DIAGNOSIS — N401 Enlarged prostate with lower urinary tract symptoms: Secondary | ICD-10-CM | POA: Diagnosis not present

## 2022-07-10 MED ORDER — CEPHALEXIN 250 MG PO CAPS
500.0000 mg | ORAL_CAPSULE | Freq: Once | ORAL | Status: AC
Start: 2022-07-10 — End: 2022-07-10
  Administered 2022-07-10: 500 mg via ORAL

## 2022-07-10 NOTE — Progress Notes (Unsigned)
Cath Change/ Replacement  Patient is present today for a catheter change due to urinary retention.  10ml of water was removed from the balloon, a 16 coudeFR foley cath was removed without difficulty.  Patient was cleaned and prepped in a sterile fashion with betadine and 2% lidocaine jelly was instilled into the urethra. A 20 coude FR foley cath was replaced into the bladder, no complications were noted. Urine return was noted and urine was yellow in color. The balloon was filled with 10ml of sterile water. A bedside bag was attached for drainage. Patient was given proper instruction on catheter care.    Performed by: Charday Capetillo LPN  Follow up: per MD note

## 2022-07-10 NOTE — Progress Notes (Signed)
I spoke with Elease Etienne. We have discussed possible surgery dates and 07/25/2022 was agreed upon by all parties. Patient given information about surgery date, what to expect pre-operatively and post operatively.    We discussed that a pre-op nurse will be calling to set up the pre-op visit that will take place prior to surgery. Informed patient that our office will communicate any additional care to be provided after surgery.    Patients questions or concerns were discussed during our call. Advised to call our office should there be any additional information, questions or concerns that arise. Patient verbalized understanding.

## 2022-07-11 ENCOUNTER — Encounter: Payer: Self-pay | Admitting: Urology

## 2022-07-11 ENCOUNTER — Telehealth: Payer: Self-pay | Admitting: *Deleted

## 2022-07-11 NOTE — Patient Instructions (Signed)

## 2022-07-11 NOTE — H&P (View-Only) (Signed)
   07/10/2022  CC: urinary retention, hematuria   HPI: Mr Voorheis is a 72yo here for followup for hematuria and gross hematuria Blood pressure (!) 158/68, pulse 70. NED. A&Ox3.   No respiratory distress   Abd soft, NT, ND Normal phallus with bilateral descended testicles  Cystoscopy Procedure Note  Patient identification was confirmed, informed consent was obtained, and patient was prepped using Betadine solution.  Lidocaine jelly was administered per urethral meatus.     Pre-Procedure: - Inspection reveals a normal caliber ureteral meatus.  Procedure: The flexible cystoscope was introduced without difficulty - No urethral strictures/lesions are present. - Enlarged prostate friable prostatic urethra - Normal bladder neck - Bilateral ureteral orifices identified - Bladder mucosa  reveals no ulcers, tumors, or lesions - No bladder stones - No trabeculation     Post-Procedure: - Patient tolerated the procedure well  Assessment/ Plan: We discussed the management of his BPH including continued medical therapy, Rezum, Urolift, TURP and simple prostatectomy. After discussing the options the patient has elected to proceed with TURP. Risks/benefits/alternatives discussed.   No follow-ups on file.  Rayn Shorb, MD  

## 2022-07-11 NOTE — Progress Notes (Signed)
   07/10/2022  CC: urinary retention, hematuria   HPI: Nathan Frye is a 72yo here for followup for hematuria and gross hematuria Blood pressure (!) 158/68, pulse 70. NED. A&Ox3.   No respiratory distress   Abd soft, NT, ND Normal phallus with bilateral descended testicles  Cystoscopy Procedure Note  Patient identification was confirmed, informed consent was obtained, and patient was prepped using Betadine solution.  Lidocaine jelly was administered per urethral meatus.     Pre-Procedure: - Inspection reveals a normal caliber ureteral meatus.  Procedure: The flexible cystoscope was introduced without difficulty - No urethral strictures/lesions are present. - Enlarged prostate friable prostatic urethra - Normal bladder neck - Bilateral ureteral orifices identified - Bladder mucosa  reveals no ulcers, tumors, or lesions - No bladder stones - No trabeculation     Post-Procedure: - Patient tolerated the procedure well  Assessment/ Plan: We discussed the management of his BPH including continued medical therapy, Rezum, Urolift, TURP and simple prostatectomy. After discussing the options the patient has elected to proceed with TURP. Risks/benefits/alternatives discussed.   No follow-ups on file.  Wilkie Aye, MD

## 2022-07-11 NOTE — Telephone Encounter (Signed)
   Pre-operative Risk Assessment    Patient Name: Nathan Frye  DOB: 04/25/1950 MRN: [id number]      Request for Surgical Clearance    Procedure:   TRANSURETHRAL RESECTION OF THE PROSTATE  Date of Surgery:  Clearance 07/25/22                                 Surgeon:  DR. Ronne Binning Surgeon's Group or Practice Name:  [facility] Phone number:  [phone] Fax number:  [phone]   Type of Clearance Requested:   - Medical  - Pharmacy:  Hold Warfarin (Coumadin) X'S 5 DAYS   Type of Anesthesia:  General    Additional requests/questions:    Nathan Frye   07/11/2022, 9:16 AM

## 2022-07-12 ENCOUNTER — Telehealth: Payer: Self-pay | Admitting: *Deleted

## 2022-07-12 NOTE — Telephone Encounter (Signed)
Patient with diagnosis of Aortic valve replacement on warfarin for anticoagulation.    Procedure:   TRANSURETHRAL RESECTION OF THE PROSTATE   Date of Surgery:  Clearance 07/25/22       CrCl 69 Platelet count 413  Per office protocol, patient can hold warfarin for 5 days prior to procedure.   Patient will not need bridging with Lovenox (enoxaparin) around procedure.  (Coumadin clinic aware of hold)  **This guidance is not considered finalized until pre-operative APP has relayed final recommendations.**

## 2022-07-12 NOTE — Telephone Encounter (Signed)
Pt has been scheduled for tele pre op appt 07/19/22 @ 1:40. Med rec and consent are done. Pt has appt 07/19/22 11 am with coumadin clinic. Tele appt scheduled in the PM so we can have coumadin notes when pre op APP calls the pt.     Patient Consent for Virtual Visit        HUSNAIN ZAHNOW has provided verbal consent on 07/12/2022 for a virtual visit (video or telephone).   CONSENT FOR VIRTUAL VISIT FOR:  [name]  By participating in this virtual visit I agree to the following:  I hereby voluntarily request, consent and authorize [facility] and its employed or contracted physicians, physician assistants, nurse practitioners or other licensed health care professionals (the Practitioner), to provide me with telemedicine health care services (the "Services") as deemed necessary by the treating Practitioner. I acknowledge and consent to receive the Services by the Practitioner via telemedicine. I understand that the telemedicine visit will involve communicating with the Practitioner through live audiovisual communication technology and the disclosure of certain medical information by electronic transmission. I acknowledge that I have been given the opportunity to request an in-person assessment or other available alternative prior to the telemedicine visit and am voluntarily participating in the telemedicine visit.  I understand that I have the right to withhold or withdraw my consent to the use of telemedicine in the course of my care at any time, without affecting my right to future care or treatment, and that the Practitioner or I may terminate the telemedicine visit at any time. I understand that I have the right to inspect all information obtained and/or recorded in the course of the telemedicine visit and may receive copies of available information for a reasonable fee.  I understand that some of the potential risks of receiving the Services via telemedicine include:  Delay or  interruption in medical evaluation due to technological equipment failure or disruption; Information transmitted may not be sufficient (e.g. poor resolution of images) to allow for appropriate medical decision making by the Practitioner; and/or  In rare instances, security protocols could fail, causing a breach of personal health information.  Furthermore, I acknowledge that it is my responsibility to provide information about my medical history, conditions and care that is complete and accurate to the best of my ability. I acknowledge that Practitioner's advice, recommendations, and/or decision may be based on factors not within their control, such as incomplete or inaccurate data provided by me or distortions of diagnostic images or specimens that may result from electronic transmissions. I understand that the practice of medicine is not an exact science and that Practitioner makes no warranties or guarantees regarding treatment outcomes. I acknowledge that a copy of this consent can be made available to me via my patient portal [facility] MyChart), or I can request a printed copy by calling the office of [facility].    I understand that my insurance will be billed for this visit.   I have read or had this consent read to me. I understand the contents of this consent, which adequately explains the benefits and risks of the Services being provided via telemedicine.  I have been provided ample opportunity to ask questions regarding this consent and the Services and have had my questions answered to my satisfaction. I give my informed consent for the services to be provided through the use of telemedicine in my medical care

## 2022-07-12 NOTE — Telephone Encounter (Signed)
   Name: Nathan Frye  DOB: [birth year]  MRN: [id number]  Primary Cardiologist: [name], MD   Preoperative team, please contact this patient and set up a phone call appointment for further preoperative risk assessment. Please obtain consent and complete medication review. Thank you for your help.  I confirm that guidance regarding antiplatelet and oral anticoagulation therapy has been completed and, if necessary, noted below.  Per office protocol, patient can hold warfarin for 5 days prior to procedure.   Patient will not need bridging with Lovenox (enoxaparin) around procedure.     Ronney Asters, NP 07/12/2022, 10:21 AM [facility]

## 2022-07-12 NOTE — Telephone Encounter (Signed)
Pt has been scheduled for tele pre op appt 07/19/22 @ 1:40. Med rec and consent are done. Pt has appt 07/19/22 11 am with coumadin clinic. Tele appt scheduled in the PM so we can have coumadin notes when pre op APP calls the pt.

## 2022-07-19 ENCOUNTER — Ambulatory Visit: Payer: Medicare Other | Attending: Cardiology

## 2022-07-19 ENCOUNTER — Ambulatory Visit (INDEPENDENT_AMBULATORY_CARE_PROVIDER_SITE_OTHER): Payer: Medicare Other

## 2022-07-19 DIAGNOSIS — Z01810 Encounter for preprocedural cardiovascular examination: Secondary | ICD-10-CM | POA: Diagnosis present

## 2022-07-19 DIAGNOSIS — Z5181 Encounter for therapeutic drug level monitoring: Secondary | ICD-10-CM | POA: Insufficient documentation

## 2022-07-19 LAB — POCT INR: INR: 4.4 — AB (ref 2.0–3.0)

## 2022-07-19 NOTE — Progress Notes (Signed)
Virtual Visit via Telephone Note   Because of Nathan Frye's co-morbid illnesses, he is at least at moderate risk for complications without adequate follow up.  This format is felt to be most appropriate for this patient at this time.  The patient did not have access to video technology/had technical difficulties with video requiring transitioning to audio format only (telephone).  All issues noted in this document were discussed and addressed.  No physical exam could be performed with this format.  Please refer to the patient's chart for his consent to telehealth for [facility].  Evaluation Performed:  Preoperative cardiovascular risk assessment _____________   Date:  07/19/2022   Patient ID:  Nathan Frye, DOB April 20, 1950, MRN [id number] Patient Location:  Home Provider location:   Office  Primary Care Provider:  Wilfred Curtis, MD Primary Cardiologist:  [name], MD  Chief Complaint / Patient Profile   72 y.o. y/o male with a h/o severe aortic stenosis status post AVR, hypertension, hyperlipidemia who is pending TURP and presents today for telephonic preoperative cardiovascular risk assessment.  History of Present Illness    Nathan Frye is a 72 y.o. male who presents via audio/video conferencing for a telehealth visit today.  Pt was last seen in cardiology clinic on 12/17/2021 by Dr. Swaziland.  At that time Nathan Frye was doing well .  The patient is now pending procedure as outlined above. Since his last visit, he tells me that he is not having any chest pain, shortness of breath, or palpitations.  He currently does have a urinary catheter in place so it does limit his activity somewhat.  He was able to run out to the store the other day but he really misses going to the gym.  He did swing by the gym just to say hello to  everybody but misses working out.  He does exceed 4 METS on the DASI.  Per office protocol, patient can hold warfarin for 5 days prior  to procedure.   Patient will not need bridging with Lovenox (enoxaparin) around procedure.  Past Medical History    Past Medical History:  Diagnosis Date   Chronic anticoagulation    HTN (hypertension)    Hypercholesteremia    Severe aortic stenosis    Past Surgical History:  Procedure Laterality Date   AORTIC VALVE REPLACEMENT  2007   #21 MM [facility] VALVE   VASECTOMY      Allergies  Allergies  Allergen Reactions   Ciprofloxacin     Patient states he does not want to take Cipro anymore do to an interaction with the coumadin he is chronically taking. Patient request that a note be placed in his chart to not give him Cipro.    Home Medications    Prior to Admission medications   Medication Sig Start Date End Date Taking? Authorizing Provider  cephALEXin (KEFLEX) 500 MG capsule Take 1 capsule (500 mg total) by mouth 2 (two) times daily. Patient not taking: Reported on 07/18/2022 06/10/22   Tyrone Nine, MD  Ergocalciferol 2000 UNITS TABS Take 2,000 Units by mouth daily.    [provider]  ferrous sulfate 325 (65 FE) MG EC tablet Take 1 tablet by mouth daily. 02/10/20   [provider]  hydrochlorothiazide (HYDRODIURIL) 25 MG tablet Take 1 tablet (25 mg total) by mouth daily. 12/17/21 12/12/22  Swaziland, Peter M, MD  latanoprost (XALATAN) 0.005 % ophthalmic solution 1 drop at bedtime. 04/08/20  [provider]  losartan (COZAAR) 100 MG tablet Take 1 tablet (100 mg total) by mouth daily. 12/17/21   Swaziland, Peter M, MD  metoprolol succinate (TOPROL-XL) 25 MG 24 hr tablet Take 1 tablet (25 mg total) by mouth daily. 12/17/21   [place]azil[place], Peter M, MD  rosuvastatin (CRESTOR) 10 MG tablet Take 1 tablet (10 mg total) by mouth daily. 04/23/22   [place]il[place], Peter M, MD  silodosin (RAPAFLO) 8 MG CAPS capsule Take 1 capsule (8 mg total) by mouth at bedtime. 06/19/22   McKenzie, Mardene Celeste, MD  tamsulosin (FLOMAX) 0.4 MG CAPS capsule Take 1 capsule (0.4 mg total) by mouth  daily after supper. Patient not taking: Reported on 07/18/2022 06/10/22   Tyrone Nine, MD  warfarin (COUMADIN) 5 MG tablet TAKE 1 TO 1 AND 1/2 TABLETS DAILY AS DIRECTED BY COUMADIN CLINIC Patient taking differently: Take 5-7.5 mg by mouth See admin instructions. Sunday,Monday,Wednesday and Friday 1 tablet(5mg ) Tuesday Thursday Saturday 1.5 tablet (7.5) 04/19/22   Swaziland, Peter M, MD  Wheat Dextrin [facility]) POWD Take 1 Scoop by mouth daily.    [provider]    Physical Exam    Vital Signs:  Nathan Frye does not have vital signs available for review today.  Given telephonic nature of communication, physical exam is limited. AAOx3. NAD. Normal affect.  Speech and respirations are unlabored.  Accessory Clinical Findings    None  Assessment & Plan    1.  Preoperative Cardiovascular Risk Assessment:  Nathan Frye perioperative risk of a major cardiac event is 0.4% according to the Revised Cardiac Risk Index (RCRI).  Therefore, he is at low risk for perioperative complications.   His functional capacity is good at 4.73 METs according to the [facility] Activity Status Index (DASI). Recommendations: According to ACC/AHA guidelines, no further cardiovascular testing needed.  The patient may proceed to surgery at acceptable risk.   Antiplatelet and/or Anticoagulation Recommendations:  Coumadin can by held for 5 days prior to surgery.  Please resume post op when felt to be safe.   The patient was advised that if he develops new symptoms prior to surgery to contact our office to arrange for a follow-up visit, and he verbalized understanding.  A copy of this note will be routed to requesting surgeon.  Time:   Today, I have spent 6 minutes with the patient with telehealth technology discussing medical history, symptoms, and management plan.     Sharlene Dory, PA-C  07/19/2022, 1:41 PM

## 2022-07-19 NOTE — Patient Instructions (Signed)
HOLD 6/8-6/12 FOR PROCEDURE 6/13 then continue taking warfarin 1 tablet daily except for 1.5 tablets on Tuesday, Thursday, and Saturday. Recheck INR in 2 weeks. (Usually checks INR every 6 weeks). Call us with any medication changes or concerns # [phone] or [phone] Clinic, Main # [phone], Fax #[phone] or [phone]

## 2022-07-22 ENCOUNTER — Encounter (HOSPITAL_COMMUNITY)
Admission: RE | Admit: 2022-07-22 | Discharge: 2022-07-22 | Disposition: A | Discharge status: Home / Self Care | Payer: Medicare Other | Source: Ambulatory Visit | Attending: Urology | Admitting: Urology

## 2022-07-22 ENCOUNTER — Other Ambulatory Visit: Payer: Self-pay

## 2022-07-22 ENCOUNTER — Encounter (HOSPITAL_COMMUNITY): Payer: Self-pay

## 2022-07-22 VITALS — Ht 69.0 in | Wt 160.0 lb

## 2022-07-22 DIAGNOSIS — D649 Anemia, unspecified: Secondary | ICD-10-CM

## 2022-07-22 DIAGNOSIS — E876 Hypokalemia: Secondary | ICD-10-CM

## 2022-07-22 NOTE — Pre-Procedure Instructions (Signed)
Patients last H&H was 7.3/23.4. He states he has not had any blood work done since then. He was scheduled as  phone call, but needs to repeat blood work. He is unable to come back to [place] before his procedure for blood work due to prior appointments He will come in an hour earlier than needed the day of his procedure for labs.

## 2022-07-25 ENCOUNTER — Encounter (HOSPITAL_COMMUNITY): Payer: Self-pay | Admitting: Urology

## 2022-07-25 ENCOUNTER — Observation Stay (HOSPITAL_COMMUNITY)
Admission: RE | Admit: 2022-07-25 | Discharge: 2022-07-26 | Disposition: A | Discharge status: Home / Self Care | Payer: Medicare Other | Attending: Urology | Admitting: Urology

## 2022-07-25 ENCOUNTER — Ambulatory Visit (HOSPITAL_BASED_OUTPATIENT_CLINIC_OR_DEPARTMENT_OTHER): Payer: Medicare Other | Admitting: Anesthesiology

## 2022-07-25 ENCOUNTER — Encounter (HOSPITAL_COMMUNITY)
Admission: RE | Disposition: A | Discharge status: Home / Self Care | Payer: Self-pay | Source: Home / Self Care | Attending: Urology

## 2022-07-25 ENCOUNTER — Other Ambulatory Visit (HOSPITAL_COMMUNITY)
Admission: RE | Admit: 2022-07-25 | Discharge: 2022-07-25 | Disposition: A | Discharge status: Home / Self Care | Payer: Medicare Other | Source: Ambulatory Visit | Attending: Urology | Admitting: Urology

## 2022-07-25 ENCOUNTER — Ambulatory Visit (HOSPITAL_COMMUNITY): Payer: Medicare Other | Admitting: Anesthesiology

## 2022-07-25 DIAGNOSIS — I10 Essential (primary) hypertension: Secondary | ICD-10-CM | POA: Insufficient documentation

## 2022-07-25 DIAGNOSIS — R338 Other retention of urine: Secondary | ICD-10-CM | POA: Insufficient documentation

## 2022-07-25 DIAGNOSIS — R339 Retention of urine, unspecified: Secondary | ICD-10-CM

## 2022-07-25 DIAGNOSIS — N400 Enlarged prostate without lower urinary tract symptoms: Secondary | ICD-10-CM

## 2022-07-25 DIAGNOSIS — N289 Disorder of kidney and ureter, unspecified: Secondary | ICD-10-CM | POA: Diagnosis not present

## 2022-07-25 DIAGNOSIS — N138 Other obstructive and reflux uropathy: Secondary | ICD-10-CM

## 2022-07-25 DIAGNOSIS — D649 Anemia, unspecified: Secondary | ICD-10-CM

## 2022-07-25 DIAGNOSIS — R319 Hematuria, unspecified: Secondary | ICD-10-CM | POA: Insufficient documentation

## 2022-07-25 DIAGNOSIS — N401 Enlarged prostate with lower urinary tract symptoms: Principal | ICD-10-CM | POA: Insufficient documentation

## 2022-07-25 DIAGNOSIS — E876 Hypokalemia: Secondary | ICD-10-CM

## 2022-07-25 HISTORY — PX: TRANSURETHRAL RESECTION OF PROSTATE: SHX73

## 2022-07-25 LAB — BASIC METABOLIC PANEL
Anion gap: 10 (ref 5–15)
Anion gap: 7 (ref 5–15)
BUN: 12 mg/dL (ref 8–23)
BUN: 10 mg/dL (ref 8–23)
CO2: 28 mmol/L (ref 22–32)
CO2: 27 mmol/L (ref 22–32)
Calcium: 10 mg/dL (ref 8.9–10.3)
Calcium: 8.9 mg/dL (ref 8.9–10.3)
Chloride: 100 mmol/L (ref 98–111)
Chloride: 102 mmol/L (ref 98–111)
Creatinine, Ser: 1.18 mg/dL (ref 0.61–1.24)
Creatinine, Ser: 1.14 mg/dL (ref 0.61–1.24)
GFR, Estimated: >60 mL/min (ref >60)
GFR, Estimated: >60 mL/min (ref >60)
Glucose, Bld: 115 mg/dL — ABNORMAL HIGH (ref 70–99)
Glucose, Bld: 124 mg/dL — ABNORMAL HIGH (ref 70–99)
Potassium: 4 mmol/L (ref 3.5–5.1)
Potassium: 3.6 mmol/L (ref 3.5–5.1)
Sodium: 138 mmol/L (ref 135–145)
Sodium: 136 mmol/L (ref 135–145)

## 2022-07-25 LAB — CBC WITH DIFFERENTIAL/PLATELET
Abs Immature Granulocytes: 0.02 10*3/uL (ref 0.00–0.07)
Basophils Absolute: 0 10*3/uL (ref 0.0–0.1)
Basophils Relative: 0 %
Eosinophils Absolute: 0.1 10*3/uL (ref 0.0–0.5)
Eosinophils Relative: 1 %
HCT: 34.3 % — ABNORMAL LOW (ref 39.0–52.0)
Hemoglobin: 11 g/dL — ABNORMAL LOW (ref 13.0–17.0)
Immature Granulocytes: 0 %
Lymphocytes Relative: 17 %
Lymphs Abs: 1.2 10*3/uL (ref 0.7–4.0)
MCH: 28.8 pg (ref 26.0–34.0)
MCHC: 32.1 g/dL (ref 30.0–36.0)
MCV: 89.8 fL (ref 80.0–100.0)
Monocytes Absolute: 0.6 10*3/uL (ref 0.1–1.0)
Monocytes Relative: 9 %
Neutro Abs: 4.9 10*3/uL (ref 1.7–7.7)
Neutrophils Relative %: 73 %
Platelets: 345 10*3/uL (ref 150–400)
RBC: 3.82 MIL/uL — ABNORMAL LOW (ref 4.22–5.81)
RDW: 12.5 % (ref 11.5–15.5)
WBC: 6.8 10*3/uL (ref 4.0–10.5)
nRBC: 0 % (ref 0.0–0.2)

## 2022-07-25 LAB — CBC
HCT: 29.8 % — ABNORMAL LOW (ref 39.0–52.0)
Hemoglobin: 9.3 g/dL — ABNORMAL LOW (ref 13.0–17.0)
MCH: 28.1 pg (ref 26.0–34.0)
MCHC: 31.2 g/dL (ref 30.0–36.0)
MCV: 90 fL (ref 80.0–100.0)
Platelets: 307 10*3/uL (ref 150–400)
RBC: 3.31 MIL/uL — ABNORMAL LOW (ref 4.22–5.81)
RDW: 12.5 % (ref 11.5–15.5)
WBC: 12.9 10*3/uL — ABNORMAL HIGH (ref 4.0–10.5)
nRBC: 0 % (ref 0.0–0.2)

## 2022-07-25 SURGERY — TURP (TRANSURETHRAL RESECTION OF PROSTATE)
Anesthesia: General | Site: Prostate

## 2022-07-25 MED ORDER — PHENYLEPHRINE HCL-NACL 20-0.9 MG/250ML-% IV SOLN
INTRAVENOUS | Status: DC | PRN
  Administered 2022-07-25: 30 ug/min via INTRAVENOUS

## 2022-07-25 MED ORDER — CHLORHEXIDINE GLUCONATE 0.12 % MT SOLN
15.0000 mL | Freq: Once | OROMUCOSAL | Status: AC
  Administered 2022-07-25: 15 mL via OROMUCOSAL

## 2022-07-25 MED ORDER — DEXAMETHASONE SODIUM PHOSPHATE 4 MG/ML IJ SOLN
INTRAMUSCULAR | Status: DC | PRN
  Administered 2022-07-25: 5 mg via INTRAVENOUS

## 2022-07-25 MED ORDER — FENTANYL CITRATE (PF) 100 MCG/2ML IJ SOLN
INTRAMUSCULAR | Status: AC
  Filled 2022-07-25: qty 2

## 2022-07-25 MED ORDER — SENNOSIDES-DOCUSATE SODIUM 8.6-50 MG PO TABS
2.0000 | ORAL_TABLET | Freq: Every day | ORAL | Status: DC
  Administered 2022-07-25: 2 via ORAL
  Filled 2022-07-25: qty 2

## 2022-07-25 MED ORDER — SODIUM CHLORIDE 0.9 % IV SOLN
INTRAVENOUS | Status: AC
  Filled 2022-07-25: qty 20

## 2022-07-25 MED ORDER — SODIUM CHLORIDE 0.9 % IR SOLN
Status: DC | PRN
  Administered 2022-07-25 (×15): 3000 mL via INTRAVESICAL

## 2022-07-25 MED ORDER — FENTANYL CITRATE (PF) 100 MCG/2ML IJ SOLN
INTRAMUSCULAR | Status: DC | PRN
  Administered 2022-07-25 (×4): 50 ug via INTRAVENOUS

## 2022-07-25 MED ORDER — SODIUM CHLORIDE 0.9 % IR SOLN
3000.0000 mL | Status: DC
  Administered 2022-07-25 – 2022-07-26 (×5): 3000 mL

## 2022-07-25 MED ORDER — FINASTERIDE 5 MG PO TABS
5.0000 mg | ORAL_TABLET | Freq: Every day | ORAL | Status: DC
  Administered 2022-07-25 – 2022-07-26 (×2): 5 mg via ORAL
  Filled 2022-07-25 (×2): qty 1

## 2022-07-25 MED ORDER — SODIUM CHLORIDE 0.9 % IV SOLN
2.0000 g | INTRAVENOUS | Status: AC
  Administered 2022-07-25: 2 g via INTRAVENOUS

## 2022-07-25 MED ORDER — PHENYLEPHRINE HCL (PRESSORS) 10 MG/ML IV SOLN
INTRAVENOUS | Status: DC | PRN
  Administered 2022-07-25 (×5): 100 ug via INTRAVENOUS

## 2022-07-25 MED ORDER — WATER FOR IRRIGATION, STERILE IR SOLN
Status: DC | PRN
  Administered 2022-07-25: 500 mL

## 2022-07-25 MED ORDER — EPHEDRINE SULFATE-NACL 50-0.9 MG/10ML-% IV SOSY
PREFILLED_SYRINGE | INTRAVENOUS | Status: DC | PRN
  Administered 2022-07-25 (×5): 5 mg via INTRAVENOUS

## 2022-07-25 MED ORDER — SODIUM CHLORIDE 0.9 % IV SOLN: INTRAVENOUS | Status: DC

## 2022-07-25 MED ORDER — OXYBUTYNIN CHLORIDE 5 MG PO TABS: 5.0000 mg | ORAL_TABLET | Freq: Three times a day (TID) | ORAL | Status: DC | PRN

## 2022-07-25 MED ORDER — EPHEDRINE 5 MG/ML INJ
INTRAVENOUS | Status: AC
  Filled 2022-07-25: qty 5

## 2022-07-25 MED ORDER — METOPROLOL SUCCINATE ER 25 MG PO TB24
25.0000 mg | ORAL_TABLET | Freq: Every day | ORAL | Status: DC
  Administered 2022-07-26: 25 mg via ORAL
  Filled 2022-07-25: qty 1

## 2022-07-25 MED ORDER — PHENYLEPHRINE HCL (PRESSORS) 10 MG/ML IV SOLN
INTRAVENOUS | Status: AC
  Filled 2022-07-25: qty 1

## 2022-07-25 MED ORDER — SODIUM CHLORIDE 0.9 % IV SOLN: INTRAVENOUS | Status: DC | PRN

## 2022-07-25 MED ORDER — PROPOFOL 10 MG/ML IV BOLUS
INTRAVENOUS | Status: AC
  Filled 2022-07-25: qty 20

## 2022-07-25 MED ORDER — HYDROCHLOROTHIAZIDE 25 MG PO TABS
25.0000 mg | ORAL_TABLET | Freq: Every day | ORAL | Status: DC
  Administered 2022-07-26: 25 mg via ORAL
  Filled 2022-07-25: qty 1

## 2022-07-25 MED ORDER — ACETAMINOPHEN 325 MG PO TABS: 650.0000 mg | ORAL_TABLET | ORAL | Status: DC | PRN

## 2022-07-25 MED ORDER — ONDANSETRON HCL 4 MG/2ML IJ SOLN
INTRAMUSCULAR | Status: DC | PRN
  Administered 2022-07-25: 4 mg via INTRAVENOUS

## 2022-07-25 MED ORDER — DIPHENHYDRAMINE HCL 12.5 MG/5ML PO ELIX: 12.5000 mg | ORAL_SOLUTION | Freq: Four times a day (QID) | ORAL | Status: DC | PRN

## 2022-07-25 MED ORDER — SODIUM CHLORIDE (PF) 0.9 % IJ SOLN
INTRAMUSCULAR | Status: AC
  Filled 2022-07-25: qty 10

## 2022-07-25 MED ORDER — PROPOFOL 10 MG/ML IV BOLUS
INTRAVENOUS | Status: DC | PRN
  Administered 2022-07-25: 80 mg via INTRAVENOUS
  Administered 2022-07-25: 120 mg via INTRAVENOUS
  Administered 2022-07-25: 20 mg via INTRAVENOUS

## 2022-07-25 MED ORDER — LOSARTAN POTASSIUM 50 MG PO TABS
100.0000 mg | ORAL_TABLET | Freq: Every day | ORAL | Status: DC
  Administered 2022-07-26: 100 mg via ORAL
  Filled 2022-07-25: qty 2

## 2022-07-25 MED ORDER — LATANOPROST 0.005 % OP SOLN
1.0000 [drp] | Freq: Every day | OPHTHALMIC | Status: DC
  Administered 2022-07-25: 1 [drp] via OPHTHALMIC
  Filled 2022-07-25: qty 2.5

## 2022-07-25 MED ORDER — ROCURONIUM BROMIDE 10 MG/ML (PF) SYRINGE
PREFILLED_SYRINGE | INTRAVENOUS | Status: AC
  Filled 2022-07-25: qty 10

## 2022-07-25 MED ORDER — OXYCODONE HCL 5 MG PO TABS: 5.0000 mg | ORAL_TABLET | ORAL | Status: DC | PRN

## 2022-07-25 MED ORDER — DIPHENHYDRAMINE HCL 50 MG/ML IJ SOLN: 12.5000 mg | Freq: Four times a day (QID) | INTRAMUSCULAR | Status: DC | PRN

## 2022-07-25 MED ORDER — SODIUM CHLORIDE 0.9 % IV SOLN
2.0000 g | INTRAVENOUS | Status: DC
  Administered 2022-07-26: 2 g via INTRAVENOUS
  Filled 2022-07-25: qty 20

## 2022-07-25 MED ORDER — LACTATED RINGERS IV SOLN: INTRAVENOUS | Status: DC

## 2022-07-25 MED ORDER — ONDANSETRON HCL 4 MG/2ML IJ SOLN: 4.0000 mg | INTRAMUSCULAR | Status: DC | PRN

## 2022-07-25 MED ORDER — PHENYLEPHRINE 80 MCG/ML (10ML) SYRINGE FOR IV PUSH (FOR BLOOD PRESSURE SUPPORT)
PREFILLED_SYRINGE | INTRAVENOUS | Status: AC
  Filled 2022-07-25: qty 10

## 2022-07-25 MED ORDER — ZOLPIDEM TARTRATE 5 MG PO TABS: 5.0000 mg | ORAL_TABLET | Freq: Every evening | ORAL | Status: DC | PRN

## 2022-07-25 MED ORDER — ROSUVASTATIN CALCIUM 10 MG PO TABS
10.0000 mg | ORAL_TABLET | Freq: Every day | ORAL | Status: DC
  Administered 2022-07-26: 10 mg via ORAL
  Filled 2022-07-25: qty 1

## 2022-07-25 MED ORDER — HYDROMORPHONE HCL 1 MG/ML IJ SOLN: 0.5000 mg | INTRAMUSCULAR | Status: DC | PRN

## 2022-07-25 MED ORDER — HYDROMORPHONE HCL 1 MG/ML IJ SOLN: 0.2500 mg | INTRAMUSCULAR | Status: DC | PRN

## 2022-07-25 MED ORDER — ONDANSETRON HCL 4 MG/2ML IJ SOLN: 4.0000 mg | Freq: Once | INTRAMUSCULAR | Status: DC | PRN

## 2022-07-25 MED ORDER — ONDANSETRON HCL 4 MG/2ML IJ SOLN
INTRAMUSCULAR | Status: AC
  Filled 2022-07-25: qty 2

## 2022-07-25 MED ORDER — ORAL CARE MOUTH RINSE: 15.0000 mL | Freq: Once | OROMUCOSAL | Status: AC

## 2022-07-25 MED ORDER — PHENYLEPHRINE 80 MCG/ML (10ML) SYRINGE FOR IV PUSH (FOR BLOOD PRESSURE SUPPORT)
PREFILLED_SYRINGE | INTRAVENOUS | Status: DC | PRN
  Administered 2022-07-25 (×4): 160 ug via INTRAVENOUS
  Administered 2022-07-25 (×2): 80 ug via INTRAVENOUS

## 2022-07-25 SURGICAL SUPPLY — 24 items
BAG DRAIN URO TABLE W/ADPT NS (BAG) ×1 IMPLANT
BAG DRN 8 ADPR NS SKTRN CSTL (BAG) ×1
BAG DRN URN TUBE DRIP CHMBR (OSTOMY) ×1
BAG URINE DRAIN TURP 4L (OSTOMY) ×1 IMPLANT
CATH FOLEY 3WAY 30CC 22F (CATHETERS) ×1 IMPLANT
CATH FOLEY 3WAY 30CC 22FR (CATHETERS) IMPLANT
CLOTH BEACON ORANGE TIMEOUT ST (SAFETY) ×1 IMPLANT
ELECT REM PT RETURN 9FT ADLT (ELECTROSURGICAL) ×1
ELECTRODE REM PT RTRN 9FT ADLT (ELECTROSURGICAL) ×1 IMPLANT
GLOVE BIO SURGEON STRL SZ8 (GLOVE) ×1 IMPLANT
GLOVE BIOGEL PI IND STRL 7.0 (GLOVE) ×2 IMPLANT
GOWN STRL REUS W/TWL LRG LVL3 (GOWN DISPOSABLE) ×1 IMPLANT
GOWN STRL REUS W/TWL XL LVL3 (GOWN DISPOSABLE) ×1 IMPLANT
IV NS IRRIG 3000ML ARTHROMATIC (IV SOLUTION) ×4 IMPLANT
KIT TURNOVER CYSTO (KITS) ×1 IMPLANT
LOOP CUT BIPOLAR 24F LRG (ELECTROSURGICAL) ×1 IMPLANT
MANIFOLD NEPTUNE II (INSTRUMENTS) ×1 IMPLANT
PACK CYSTO (CUSTOM PROCEDURE TRAY) ×1 IMPLANT
PAD ARMBOARD 7.5X6 YLW CONV (MISCELLANEOUS) ×1 IMPLANT
SYR 30ML LL (SYRINGE) ×1 IMPLANT
SYR TOOMEY IRRIG 70ML (MISCELLANEOUS) ×1
SYRINGE TOOMEY IRRIG 70ML (MISCELLANEOUS) ×1 IMPLANT
TOWEL OR 17X26 4PK STRL BLUE (TOWEL DISPOSABLE) ×1 IMPLANT
WATER STERILE IRR 500ML POUR (IV SOLUTION) ×1 IMPLANT

## 2022-07-25 NOTE — Op Note (Signed)
Preoperative diagnosis: BPH  Postoperative diagnosis: BPH  Procedure: 1 cystoscopy 2. Transurethral resection of the prostate  Attending: Wilkie Aye  Anesthesia: General  Estimated blood loss: 300cc  Drains: 22 French foley  Specimens: 1. Prostate Chips  Antibiotics: Rocephin  Findings: Trilobar prostate enlargement. Ureteral orifices in normal anatomic location.   Indications: Patient is a 72 year old male with a history of BPH and urinary retention.  After discussing treatment options, they decided proceed with transurethral resection of the prostate.  Procedure in detail: The patient was brought to the operating room and a brief timeout was done to ensure correct patient, correct procedure, correct site.  General anesthesia was administered patient was placed in dorsal lithotomy position.  Their genitalia was then prepped and draped in usual sterile fashion.  A rigid 22 French cystoscope was passed in the urethra and the bladder.  Bladder was inspected and we noted no masses or lesions.  the ureteral orifices were in the normal orthotopic locations. removed the cystoscope and placed a resectoscope into the bladder. We then turned our attention to the prostate resection. Using the bipolar resectoscope we resected the median lobe first from the bladder neck to the verumontanum. We then started at the 12 oclock position on the left lobe and resection to the 6 o'clock position from the bladder neck to the verumontanum. We then did the same resection of the right lobe. Once the resection was complete we then cauterized individual bleeders. We then removed the prostate chips and sent them for pathology.  We then re-inspected the prostatic fossa and found no residual bleeding.  the bladder was then drained, a 22 French foley was placed and this concluded the procedure which was well tolerated by patient.  Complications: None  Condition: Stable, extubated, transferred to PACU  Plan:  Patient is admitted overnight with continuous bladder irrigation. If their urine is clear tomorrow they will be discharged home and followup in 5 days for foley catheter removal and pathology discussion.

## 2022-07-25 NOTE — Interval H&P Note (Signed)
History and Physical Interval Note:  07/25/2022 11:03 AM  Nathan Frye  has presented today for surgery, with the diagnosis of benign prostatic hyperplasia with retention.  The various methods of treatment have been discussed with the patient and family. After consideration of risks, benefits and other options for treatment, the patient has consented to  Procedure(s): TRANSURETHRAL RESECTION OF THE PROSTATE (TURP) (N/A) as a surgical intervention.  The patient's history has been reviewed, patient examined, no change in status, stable for surgery.  I have reviewed the patient's chart and labs.  Questions were answered to the patient's satisfaction.     Wilkie Aye

## 2022-07-25 NOTE — Anesthesia Postprocedure Evaluation (Signed)
Anesthesia Post Note  Patient: Nathan Frye  Procedure(s) Performed: TRANSURETHRAL RESECTION OF THE PROSTATE (TURP) (Prostate)  Patient location during evaluation: Phase II Anesthesia Type: General Level of consciousness: awake and alert and oriented Pain management: pain level controlled Vital Signs Assessment: post-procedure vital signs reviewed and stable Respiratory status: spontaneous breathing, nonlabored ventilation and respiratory function stable Cardiovascular status: blood pressure returned to baseline and stable Postop Assessment: no apparent nausea or vomiting Anesthetic complications: no  No notable events documented.   Last Vitals:  Vitals:   07/25/22 1445 07/25/22 1529  BP: 117/70 119/68  Pulse: 83 81  Resp: 10 18  Temp:  36.4 C  SpO2: 100% 97%    Last Pain:  Vitals:   07/25/22 1529  TempSrc: Oral  PainSc:                  Demari Gales C Sarina Robleto

## 2022-07-25 NOTE — Anesthesia Procedure Notes (Signed)
Procedure Name: LMA Insertion Date/Time: 07/25/2022 11:27 AM  Performed by: Julian Reil, CRNAPre-anesthesia Checklist: Patient identified, Emergency Drugs available, Suction available and Patient being monitored Patient Re-evaluated:Patient Re-evaluated prior to induction Oxygen Delivery Method: Circle system utilized Preoxygenation: Pre-oxygenation with 100% oxygen Induction Type: IV induction Ventilation: Mask ventilation without difficulty LMA: LMA inserted LMA Size: 4.0 Tube type: Oral Number of attempts: 1 Placement Confirmation: positive ETCO2 Tube secured with: Tape Dental Injury: Teeth and Oropharynx as per pre-operative assessment

## 2022-07-25 NOTE — Transfer of Care (Signed)
Immediate Anesthesia Transfer of Care Note  Patient: JAGAR LUA  Procedure(s) Performed: TRANSURETHRAL RESECTION OF THE PROSTATE (TURP) (Prostate)  Patient Location: PACU  Anesthesia Type:General  Level of Consciousness: awake  Airway & Oxygen Therapy: Patient Spontanous Breathing and Patient connected to face mask oxygen  Post-op Assessment: Report given to RN and Post -op Vital signs reviewed and stable  Post vital signs: Reviewed and stable  Last Vitals:  Vitals Value Taken Time  BP 135/74 07/25/22 1315  Temp    Pulse 94 07/25/22 1316  Resp 11 07/25/22 1316  SpO2 100 % 07/25/22 1316  Vitals shown include unvalidated device data.  Last Pain:  Vitals:   07/25/22 1011  PainSc: 0-No pain         Complications: No notable events documented.

## 2022-07-25 NOTE — Anesthesia Preprocedure Evaluation (Signed)
Anesthesia Evaluation  Patient identified by MRN, date of birth, ID band Patient awake    Reviewed: Allergy & Precautions, H&P , NPO status , Patient's Chart, lab work & pertinent test results, reviewed documented beta blocker date and time   Airway Mallampati: II  TM Distance: >3 FB Neck ROM: Full    Dental no notable dental hx. (+) Dental Advisory Given,    Pulmonary neg pulmonary ROS   Pulmonary exam normal breath sounds clear to auscultation       Cardiovascular hypertension, Pt. on medications and Pt. on home beta blockers + Valvular Problems/Murmurs (AV Replacement) AS  Rhythm:Regular Rate:Normal + Systolic murmurs    Neuro/Psych negative neurological ROS  negative psych ROS   GI/Hepatic negative GI ROS, Neg liver ROS,,,  Endo/Other  negative endocrine ROS    Renal/GU Renal InsufficiencyRenal disease  negative genitourinary   Musculoskeletal negative musculoskeletal ROS (+)    Abdominal   Peds negative pediatric ROS (+)  Hematology negative hematology ROS (+)   Anesthesia Other Findings   Reproductive/Obstetrics negative OB ROS                             Anesthesia Physical Anesthesia Plan  ASA: 2  Anesthesia Plan: General   Post-op Pain Management: Dilaudid IV   Induction: Intravenous  PONV Risk Score and Plan: 3 and Ondansetron and Dexamethasone  Airway Management Planned: LMA  Additional Equipment:   Intra-op Plan:   Post-operative Plan: Extubation in OR  Informed Consent: I have reviewed the patients History and Physical, chart, labs and discussed the procedure including the risks, benefits and alternatives for the proposed anesthesia with the patient or authorized representative who has indicated his/her understanding and acceptance.     Dental advisory given  Plan Discussed with: CRNA and Surgeon  Anesthesia Plan Comments:        Anesthesia Quick  Evaluation

## 2022-07-26 DIAGNOSIS — N401 Enlarged prostate with lower urinary tract symptoms: Secondary | ICD-10-CM | POA: Diagnosis not present

## 2022-07-26 LAB — BASIC METABOLIC PANEL
Anion gap: 7 (ref 5–15)
BUN: 14 mg/dL (ref 8–23)
CO2: 26 mmol/L (ref 22–32)
Calcium: 8.7 mg/dL — ABNORMAL LOW (ref 8.9–10.3)
Chloride: 102 mmol/L (ref 98–111)
Creatinine, Ser: 1.18 mg/dL (ref 0.61–1.24)
GFR, Estimated: >60 mL/min (ref >60)
Glucose, Bld: 115 mg/dL — ABNORMAL HIGH (ref 70–99)
Potassium: 3.7 mmol/L (ref 3.5–5.1)
Sodium: 135 mmol/L (ref 135–145)

## 2022-07-26 LAB — TYPE AND SCREEN: ABO/RH(D): O POS

## 2022-07-26 LAB — PREPARE RBC (CROSSMATCH)

## 2022-07-26 LAB — SURGICAL PATHOLOGY

## 2022-07-26 LAB — HEMOGLOBIN AND HEMATOCRIT, BLOOD
HCT: 33.7 % — ABNORMAL LOW (ref 39.0–52.0)
Hemoglobin: 11 g/dL — ABNORMAL LOW (ref 13.0–17.0)

## 2022-07-26 LAB — CBC
HCT: 20.6 % — ABNORMAL LOW (ref 39.0–52.0)
Hemoglobin: 6.6 g/dL — CL (ref 13.0–17.0)
MCH: 28.6 pg (ref 26.0–34.0)
MCHC: 32 g/dL (ref 30.0–36.0)
MCV: 89.2 fL (ref 80.0–100.0)
Platelets: 264 10*3/uL (ref 150–400)
RBC: 2.31 MIL/uL — ABNORMAL LOW (ref 4.22–5.81)
RDW: 12.6 % (ref 11.5–15.5)
WBC: 10.6 10*3/uL — ABNORMAL HIGH (ref 4.0–10.5)
nRBC: 0 % (ref 0.0–0.2)

## 2022-07-26 LAB — BPAM RBC: Unit Type and Rh: 5100

## 2022-07-26 MED ORDER — SODIUM CHLORIDE 0.9% IV SOLUTION: Freq: Once | INTRAVENOUS | Status: AC

## 2022-07-26 MED ORDER — HYDROCODONE-ACETAMINOPHEN 5-325 MG PO TABS: 1.0000 | ORAL_TABLET | Freq: Four times a day (QID) | ORAL | 0 refills | Status: AC | PRN

## 2022-07-26 MED ORDER — CHLORHEXIDINE GLUCONATE CLOTH 2 % EX PADS
6.0000 | MEDICATED_PAD | Freq: Every day | CUTANEOUS | Status: DC
  Administered 2022-07-26: 6 via TOPICAL

## 2022-07-26 MED ORDER — FINASTERIDE 5 MG PO TABS: 5.0000 mg | ORAL_TABLET | Freq: Every day | ORAL | 3 refills | Status: DC

## 2022-07-26 MED ORDER — FUROSEMIDE 10 MG/ML IJ SOLN
20.0000 mg | Freq: Once | INTRAMUSCULAR | Status: AC
  Administered 2022-07-26: 20 mg via INTRAVENOUS
  Filled 2022-07-26: qty 2

## 2022-07-26 NOTE — Progress Notes (Signed)
Call placed to Dr. Ronne Binning answering service, Dr. Ronne Binning called back and made aware of hgb 6.6 and doctor stated he will be in to visit pt. CBI remains in place and draining pinkish/red urine. Pt denies pain or distress. Call bell in reach.

## 2022-07-26 NOTE — Progress Notes (Signed)
Discharge instructions and foley care reviewed with the patient and patient's daughter. Both verbalized understanding and return demonstration of foley management.  Pt. Discharged home with daughter in stable condition.

## 2022-07-26 NOTE — Progress Notes (Signed)
   07/26/22 4098  Provider Notification  Provider Name/Title Dr Victorino Dike, DO  Date Provider Notified 07/26/22  Time Provider Notified [phone]  Method of Notification Call (secure chat)  Notification Reason Critical Result  Test performed and critical result Hgb 6.6  Date Critical Result Received 07/26/22  Provider response See new orders  Date of Provider Response 07/26/22  Time of Provider Response 0630

## 2022-07-26 NOTE — Care Management Obs Status (Signed)
MEDICARE OBSERVATION STATUS NOTIFICATION   Patient Details  Name: Nathan Frye MRN: [id number] Date of Birth: 07-18-1950   Medicare Observation Status Notification Given:  Yes    Corey Harold 07/26/2022, 3:51 PM

## 2022-07-26 NOTE — TOC CM/SW Note (Signed)
Transition of Care [facility]) - Inpatient Brief Assessment   Patient Details  Name: Nathan Frye MRN: [id number] Date of Birth: 04-18-1950  Transition of Care [facility]) CM/SW Contact:    Elliot Gault, LCSW Phone Number: 07/26/2022, 11:03 AM   Clinical Narrative:  Transition of Care Department [facility]) has reviewed patient and no TOC needs have been identified at this time. We will continue to monitor patient advancement through interdisciplinary progression rounds. If new patient transition needs arise, please place a TOC consult.  Transition of Care Asessment: Insurance and Status: (P) Insurance coverage has been reviewed Patient has primary care physician: (P) Yes Home environment has been reviewed: (P) from home Prior level of function:: (P) independent Prior/Current Home Services: (P) No current home services Social Determinants of Health Reivew: (P) SDOH reviewed no interventions necessary Readmission risk has been reviewed: (P) Yes Transition of care needs: (P) no transition of care needs at this time

## 2022-07-27 LAB — TYPE AND SCREEN
Antibody Screen: NEGATIVE
Unit division: 0
Unit division: 0

## 2022-07-27 LAB — BPAM RBC
Blood Product Expiration Date: 202407122359
Blood Product Expiration Date: 202407122359
ISSUE DATE / TIME: 202406141114
ISSUE DATE / TIME: 202406141446
Unit Type and Rh: 5100

## 2022-07-30 DIAGNOSIS — I10 Essential (primary) hypertension: Secondary | ICD-10-CM | POA: Insufficient documentation

## 2022-07-30 NOTE — Progress Notes (Unsigned)
Diagnoses: Post-operative state  HPI: Nathan Frye presents post-operatively. GU History: 1. BPH with BOO and retention.  Today He presents s/p the following procedures by Dr. Ronne Binning on 07/25/2022:  Preoperative diagnosis: BPH   Postoperative diagnosis: BPH   Procedure:  1. cystoscopy 2. Transurethral resection of the prostate  Pathology:  A. PROSTATE CHIPS, TURP:  Benign prostatic glandular and stromal hyperplasia  Background acute and chronic inflammation    Postop course: Today He reports***  He reports the catheter is draining ***. It was last exchanged ***. He {Actions; denies-reports:120008} gross hematuria. He {Actions; denies-reports:120008} flank pain. He {Actions; denies-reports:120008} abdominal pain. He {Actions; denies-reports:120008} fevers. He {Actions; denies-reports:120008} nausea/vomiting.   Fall Screening: Do you usually have a device to assist in your mobility? {yes/no:20286} ***cane / ***walker / ***wheelchair   Medications: Current Outpatient Medications  Medication Sig Dispense Refill   Ergocalciferol 2000 UNITS TABS Take 2,000 Units by mouth daily.     ferrous sulfate 325 (65 FE) MG EC tablet Take 1 tablet by mouth daily.     finasteride (PROSCAR) 5 MG tablet Take 1 tablet (5 mg total) by mouth daily. 90 tablet 3   hydrochlorothiazide (HYDRODIURIL) 25 MG tablet Take 1 tablet (25 mg total) by mouth daily. 90 tablet 3   HYDROcodone-acetaminophen (NORCO) 5-325 MG tablet Take 1 tablet by mouth every 6 (six) hours as needed for moderate pain. 30 tablet 0   latanoprost (XALATAN) 0.005 % ophthalmic solution 1 drop at bedtime.     losartan (COZAAR) 100 MG tablet Take 1 tablet (100 mg total) by mouth daily. 90 tablet 3   metoprolol succinate (TOPROL-XL) 25 MG 24 hr tablet Take 1 tablet (25 mg total) by mouth daily. 90 tablet 3   rosuvastatin (CRESTOR) 10 MG tablet Take 1 tablet (10 mg total) by mouth daily. 90 tablet 2   Wheat Dextrin  (BENEFIBER) POWD Take 1 Scoop by mouth daily.     No current facility-administered medications for this visit.    Allergies: Allergies  Allergen Reactions   Ciprofloxacin     Patient states he does not want to take Cipro anymore do to an interaction with the coumadin he is chronically taking. Patient request that a note be placed in his chart to not give him Cipro.    Past Medical History:  Diagnosis Date   Chronic anticoagulation    HTN (hypertension)    Hypercholesteremia    Severe aortic stenosis    Past Surgical History:  Procedure Laterality Date   AORTIC VALVE REPLACEMENT  2007   #21 MM [facility] VALVE   VASECTOMY     Family History  Problem Relation Age of Onset   Hypertension Mother    Social History   Socioeconomic History   Marital status: Widowed    Spouse name: Not on file   Number of children: 1   Years of education: Not on file   Highest education level: Not on file  Occupational History   Occupation: Photographer: [employer]  Tobacco Use   Smoking status: Never   Smokeless tobacco: Never  Vaping Use   Vaping Use: Never used  Substance and Sexual Activity   Alcohol use: No    Alcohol/week: 0.0 standard drinks of alcohol   Drug use: No   Sexual activity: Not on file  Other Topics Concern   Not on file  Social History Narrative   Not on file   Social Determinants of Health  Financial Resource Strain: Not on file  Food Insecurity: No Food Insecurity (06/05/2022)   Hunger Vital Sign    Worried About Running Out of Food in the Last Year: Never true    Ran Out of Food in the Last Year: Never true  Transportation Needs: No Transportation Needs (06/05/2022)   PRAPARE - Administrator, Civil Service (Medical): No    Lack of Transportation (Non-Medical): No  Physical Activity: Not on file  Stress: Not on file  Social Connections: Not on file  Intimate Partner Violence: Not At Risk (06/05/2022)   Humiliation, Afraid,  Rape, and Kick questionnaire    Fear of Current or Ex-Partner: No    Emotionally Abused: No    Physically Abused: No    Sexually Abused: No    SUBJECTIVE  Review of Systems Constitutional: Patient ***denies any unintentional weight loss or change in strength lntegumentary: Patient ***denies any rashes or pruritus Eyes: Patient denies ***dry eyes ENT: Patient ***denies dry mouth Cardiovascular: Patient ***denies chest pain or syncope Respiratory: Patient ***denies shortness of breath Gastrointestinal: Patient ***denies nausea, vomiting, constipation, or diarrhea Musculoskeletal: Patient ***denies muscle cramps or weakness Neurologic: Patient ***denies convulsions or seizures Psychiatric: Patient ***denies memory problems Allergic/Immunologic: Patient ***denies recent allergic reaction(s) Hematologic/Lymphatic: Patient denies bleeding tendencies Endocrine: Patient ***denies heat/cold intolerance  GU: As per HPI.  OBJECTIVE There were no vitals filed for this visit. There is no height or weight on file to calculate BMI.  Physical Examination  Constitutional: ***No obvious distress; patient is ***non-toxic appearing  Cardiovascular: ***No visible lower extremity edema.  Respiratory: The patient does ***not have audible wheezing/stridor; respirations do ***not appear labored  ***Integumentary: *** Incision well approximated and intact, dry, and without erythema, warmth, or drainage.  Gastrointestinal: Abdomen ***non-distended Musculoskeletal: ***Normal ROM of UEs  Neurologic: CN 2-12 grossly ***intact Psychiatric: Answered questions ***appropriately with ***normal affect   GU: ***Incision well healed, intact, with no surrounding erythema, edema, crepitus, fluctuance, drainage / discharge, warmth, significant tenderness to palpation.   UA: {Desc; negative/positive:13464} *** WBC/hpf, *** RBC/hpf, bacteria (***) *** nitrites, *** leukocytes, *** blood PVR: ***  ml  ASSESSMENT Benign prostatic hyperplasia, unspecified whether lower urinary tract symptoms present  Urinary urgency  S/P TURP  Postop check *** We reviewed the operative procedures and findings.   ***Voiding trial was done and pt was able to successfully empty bladder of the contents instilled. Foley catheter to remain out. Discussion was had about increasing water intake for the next few day to insure good voiding.  *** Advised pt to CIC as needed and pt is aware to call if needing to CIC regularly so we can order supplies for them.  *** Voiding trial was performed and pt was unable to successfully empty bladder of the contents instilled. Foley catheter to remain in place. Will attempt voiding trial again in 2 weeks. If unable to pass voiding trial at that time, we will pursue further evaluation with urodynamic testing. Pt is aware. All questions answered.  Will plan for follow up in ***6 months or sooner if needed. Pt verbalized understanding and agreement. All questions were answered.  PLAN Advised the following: ***Foley catheter ***discontinued. ***No follow-ups on file.  No orders of the defined types were placed in this encounter.   It has been explained that the patient is to follow regularly with their PCP in addition to all other providers involved in their care and to follow instructions provided by these respective offices. Patient advised to contact urology  clinic if any urologic-pertaining questions, concerns, new symptoms or problems arise in the interim period.  There are no Patient Instructions on file for this visit.  Electronically signed by:  Donnita Falls, MSN, FNP-C, CUNP 07/30/2022 2:49 PM

## 2022-07-31 ENCOUNTER — Encounter (HOSPITAL_COMMUNITY): Payer: Self-pay | Admitting: Urology

## 2022-08-01 ENCOUNTER — Encounter: Payer: Self-pay | Admitting: Urology

## 2022-08-01 ENCOUNTER — Ambulatory Visit (INDEPENDENT_AMBULATORY_CARE_PROVIDER_SITE_OTHER): Payer: Medicare Other | Admitting: Urology

## 2022-08-01 ENCOUNTER — Ambulatory Visit: Payer: Medicare Other | Attending: Cardiology | Admitting: *Deleted

## 2022-08-01 DIAGNOSIS — R3915 Urgency of urination: Secondary | ICD-10-CM

## 2022-08-01 DIAGNOSIS — N400 Enlarged prostate without lower urinary tract symptoms: Secondary | ICD-10-CM

## 2022-08-01 DIAGNOSIS — Z09 Encounter for follow-up examination after completed treatment for conditions other than malignant neoplasm: Secondary | ICD-10-CM

## 2022-08-01 DIAGNOSIS — N401 Enlarged prostate with lower urinary tract symptoms: Secondary | ICD-10-CM

## 2022-08-01 DIAGNOSIS — Z9079 Acquired absence of other genital organ(s): Secondary | ICD-10-CM

## 2022-08-01 DIAGNOSIS — Z5181 Encounter for therapeutic drug level monitoring: Secondary | ICD-10-CM

## 2022-08-01 DIAGNOSIS — Z952 Presence of prosthetic heart valve: Secondary | ICD-10-CM

## 2022-08-01 LAB — POCT INR: POC INR: 1.2

## 2022-08-01 NOTE — Patient Instructions (Signed)
Description   Take 2 tablets of warfarin today and 1.5 tablets of warfarin tomorrow then continue normal dose of warfarin 1 tablet daily except for 1.5 tablets on Tuesday, Thursday and Saturday. Recheck INR in 1 week.  Call us with any medication changes or concerns # [phone] or [phone] Clinic, Main # [phone], Fax #[phone] or [phone]

## 2022-08-01 NOTE — Progress Notes (Addendum)
Fill and Pull Catheter Removal  Patient is present today for a catheter removal.  Patient was cleaned and prepped in a sterile fashion, however due to bladder spams and patient was leaking out at the penis opening, he was unable to void properly. A 22 FR foley cath was removed from the bladder no complications were noted .  Patient as then given some time to void on their own.  Patient cannot void  0 ml on their own after some time.  Patient tolerated well. A 16 fr foley catheter was inserted into patient today and patient did tolerate it well.  Patient was cleaned and prepped in a sterile fashion.  Performed by: Maryland Pink RMA  Follow up/ Additional notes: f/up with Nurse Practitioner

## 2022-08-06 NOTE — Discharge Summary (Signed)
Physician Discharge Summary  Patient ID: Nathan Frye MRN: [id number] DOB/AGE: 72-Jan-1952 72 y.o.  Admit date: 07/25/2022 Discharge date: 07/26/2022  Admission Diagnoses:  BPH (benign prostatic hyperplasia)  Discharge Diagnoses:  Principal Problem:   BPH (benign prostatic hyperplasia)   Past Medical History:  Diagnosis Date   Chronic anticoagulation    HTN (hypertension)    Hypercholesteremia    Severe aortic stenosis     Surgeries: Procedure(s): TRANSURETHRAL RESECTION OF THE PROSTATE (TURP) on 07/25/2022   Consultants (if any):   Discharged Condition: Improved  Hospital Course: Nathan Frye is an 72 y.o. male who was admitted 07/25/2022 with a diagnosis of BPH (benign prostatic hyperplasia) and went to the operating room on 07/25/2022 and underwent the above named procedures.    He was given perioperative antibiotics:  Anti-infectives (From admission, onward)    Start     Dose/Rate Route Frequency Ordered Stop   07/26/22 1000  cefTRIAXone (ROCEPHIN) 2 g in sodium chloride 0.9 % 100 mL IVPB  Status:  Discontinued        2 g 200 mL/hr over 30 Minutes Intravenous Every 24 hours 07/25/22 1524 07/27/22 0111   07/25/22 1002  sodium chloride 0.9 % with cefTRIAXone (ROCEPHIN) ADS Med  Status:  Discontinued       Note to Pharmacy: Bonney Aid S: cabinet override      07/25/22 1002 07/25/22 1027   07/25/22 1001  cefTRIAXone (ROCEPHIN) 2 g in sodium chloride 0.9 % 100 mL IVPB        2 g 200 mL/hr over 30 Minutes Intravenous 30 min pre-op 07/25/22 1001 07/25/22 1140   07/25/22 0946  sodium chloride 0.9 % with cefTRIAXone (ROCEPHIN) ADS Med       Note to Pharmacy: Trenton Gammon S: cabinet override      07/25/22 0946 07/25/22 1131     .  He was given sequential compression devices, early ambulation for DVT prophylaxis.  He benefited maximally from the hospital stay and there were no complications.    Recent vital signs:  Vitals:   07/26/22 1738 07/26/22 1930   BP: (!) 105/59 109/60  Pulse: 85 82  Resp: 18 17  Temp: 99.4 F (37.4 C) 98.7 F (37.1 C)  SpO2: 100% 100%    Recent laboratory studies:  Lab Results  Component Value Date   HGB 11.0 (L) 07/26/2022   HGB 6.6 (LL) 07/26/2022   HGB 9.3 (L) 07/25/2022   Lab Results  Component Value Date   WBC 10.6 (H) 07/26/2022   PLT 264 07/26/2022   Lab Results  Component Value Date   INR 1.2 08/01/2022   Lab Results  Component Value Date   NA 135 07/26/2022   K 3.7 07/26/2022   CL 102 07/26/2022   CO2 26 07/26/2022   BUN 14 07/26/2022   CREATININE 1.18 07/26/2022   GLUCOSE 115 (H) 07/26/2022    Discharge Medications:   Allergies as of 07/26/2022       Reactions   Ciprofloxacin    Patient states he does not want to take Cipro anymore do to an interaction with the coumadin he is chronically taking. Patient request that a note be placed in his chart to not give him Cipro.        Medication List     STOP taking these medications    cephALEXin 500 MG capsule Commonly known as: KEFLEX   silodosin 8 MG Caps capsule Commonly known as: RAPAFLO   tamsulosin 0.4 MG Caps  capsule Commonly known as: FLOMAX   warfarin 5 MG tablet Commonly known as: COUMADIN       TAKE these medications    Benefiber Powd Take 1 Scoop by mouth daily.   Ergocalciferol 50 MCG (2000 UT) Tabs Take 2,000 Units by mouth daily.   ferrous sulfate 325 (65 FE) MG EC tablet Take 1 tablet by mouth daily.   finasteride 5 MG tablet Commonly known as: PROSCAR Take 1 tablet (5 mg total) by mouth daily.   hydrochlorothiazide 25 MG tablet Commonly known as: HYDRODIURIL Take 1 tablet (25 mg total) by mouth daily.   HYDROcodone-acetaminophen 5-325 MG tablet Commonly known as: Norco Take 1 tablet by mouth every 6 (six) hours as needed for moderate pain.   latanoprost 0.005 % ophthalmic solution Commonly known as: XALATAN 1 drop at bedtime.   losartan 100 MG tablet Commonly known as:  COZAAR Take 1 tablet (100 mg total) by mouth daily.   metoprolol succinate 25 MG 24 hr tablet Commonly known as: TOPROL-XL Take 1 tablet (25 mg total) by mouth daily.   rosuvastatin 10 MG tablet Commonly known as: CRESTOR Take 1 tablet (10 mg total) by mouth daily.        Diagnostic Studies: No results found.  Disposition: Discharge disposition: 01-Home or Self Care       Discharge Instructions     Discharge patient   Complete by: As directed    Discharge disposition: 01-Home or Self Care   Discharge patient date: 07/26/2022        Follow-up Information     Allure Greaser, Mardene Celeste, MD. Call on 07/31/2022.   Specialty: Urology Contact information: [address]  [place] [address] [phone]                  Signed: Wilkie Aye 08/06/2022, 8:34 AM

## 2022-08-08 ENCOUNTER — Ambulatory Visit: Payer: Medicare Other | Attending: Cardiology

## 2022-08-08 DIAGNOSIS — Z5181 Encounter for therapeutic drug level monitoring: Secondary | ICD-10-CM | POA: Diagnosis present

## 2022-08-08 LAB — POCT INR: INR: 2.1 (ref 2.0–3.0)

## 2022-08-08 NOTE — Patient Instructions (Signed)
Description   Take 2 tablets today and then continue normal dose of warfarin 1 tablet daily except for 1.5 tablets on Tuesday, Thursday and Saturday.  Recheck INR in 1 week.   Call us with any medication changes or concerns # [phone] or [phone] Clinic, Main # [phone], Fax #[phone] or [phone]

## 2022-08-09 NOTE — Progress Notes (Unsigned)
Nathan Frye 23-May-1950 [id number]  History of Present Illness: Nathan Frye is a 72 y.o. male who presents today for follow up visit at [facility]. - GU history: 1. BPH with BOO and retention.   Recent history:  - 07/25/2022: Underwent TURP by Dr. Ronne Binning. Benign pathology. - 08/01/2022: Postop visit. Failed voiding trial.  Today: He returns today for repeat voiding trial.  He denies gross hematuria.  He denies flank or abdominal pain.   He is scheduled to depart for a trip abroad to [place] in 1 week (08/19/2022).    Fall Screening: Do you usually have a device to assist in your mobility? No   Medications: Current Outpatient Medications  Medication Sig Dispense Refill   Ergocalciferol 2000 UNITS TABS Take 2,000 Units by mouth daily.     ferrous sulfate 325 (65 FE) MG EC tablet Take 1 tablet by mouth daily.     finasteride (PROSCAR) 5 MG tablet Take 1 tablet (5 mg total) by mouth daily. 90 tablet 3   hydrochlorothiazide (HYDRODIURIL) 25 MG tablet Take 1 tablet (25 mg total) by mouth daily. 90 tablet 3   HYDROcodone-acetaminophen (NORCO) 5-325 MG tablet Take 1 tablet by mouth every 6 (six) hours as needed for moderate pain. (Patient not taking: Reported on 08/12/2022) 30 tablet 0   latanoprost (XALATAN) 0.005 % ophthalmic solution 1 drop at bedtime.     losartan (COZAAR) 100 MG tablet Take 1 tablet (100 mg total) by mouth daily. 90 tablet 3   metoprolol succinate (TOPROL-XL) 25 MG 24 hr tablet Take 1 tablet (25 mg total) by mouth daily. 90 tablet 3   rosuvastatin (CRESTOR) 10 MG tablet Take 1 tablet (10 mg total) by mouth daily. 90 tablet 2   warfarin (COUMADIN) 5 MG tablet Take 5 mg by mouth daily. Take 1 to 1&1/2 tablets by mouth daily as directed by the coumadin clinic     Wheat Dextrin (BENEFIBER) POWD Take 1 Scoop by mouth daily.     No current facility-administered medications for this visit.    Allergies: Allergies  Allergen Reactions    Ciprofloxacin     Patient states he does not want to take Cipro anymore do to an interaction with the coumadin he is chronically taking. Patient request that a note be placed in his chart to not give him Cipro.    Past Medical History:  Diagnosis Date   Chronic anticoagulation    HTN (hypertension)    Hypercholesteremia    Severe aortic stenosis    Past Surgical History:  Procedure Laterality Date   AORTIC VALVE REPLACEMENT  2007   #21 MM [facility] VALVE   TRANSURETHRAL RESECTION OF PROSTATE N/A 07/25/2022   Procedure: TRANSURETHRAL RESECTION OF THE PROSTATE (TURP);  Surgeon: Malen Gauze, MD;  Location: [facility];  Service: Urology;  Laterality: N/A;   VASECTOMY     Family History  Problem Relation Age of Onset   Hypertension Mother    Social History   Socioeconomic History   Marital status: Widowed    Spouse name: Not on file   Number of children: 1   Years of education: Not on file   Highest education level: Not on file  Occupational History   Occupation: Photographer: [employer]  Tobacco Use   Smoking status: Never   Smokeless tobacco: Never  Vaping Use   Vaping Use: Never used  Substance and Sexual Activity   Alcohol use: No  Alcohol/week: 0.0 standard drinks of alcohol   Drug use: No   Sexual activity: Not Currently  Other Topics Concern   Not on file  Social History Narrative   Not on file   Social Determinants of Health   Financial Resource Strain: Not on file  Food Insecurity: No Food Insecurity (06/05/2022)   Hunger Vital Sign    Worried About Running Out of Food in the Last Year: Never true    Ran Out of Food in the Last Year: Never true  Transportation Needs: No Transportation Needs (06/05/2022)   PRAPARE - Administrator, Civil Service (Medical): No    Lack of Transportation (Non-Medical): No  Physical Activity: Not on file  Stress: Not on file  Social Connections: Not on file  Intimate Partner Violence: Not  At Risk (06/05/2022)   Humiliation, Afraid, Rape, and Kick questionnaire    Fear of Current or Ex-Partner: No    Emotionally Abused: No    Physically Abused: No    Sexually Abused: No    Review of Systems Constitutional: Patient denies any unintentional weight loss or change in strength lntegumentary: Patient denies any rashes or pruritus Eyes: Patient denies dry eyes ENT: Patient denies dry mouth Cardiovascular: Patient denies chest pain or syncope Respiratory: Patient denies shortness of breath Gastrointestinal: Patient denies nausea, vomiting, constipation, or diarrhea Musculoskeletal: Patient denies muscle cramps or weakness Neurologic: Patient denies convulsions or seizures Psychiatric: Patient denies memory problems Allergic/Immunologic: Patient denies recent allergic reaction(s) Hematologic/Lymphatic: Patient denies bleeding tendencies Endocrine: Patient denies heat/cold intolerance  GU: As per HPI.  OBJECTIVE Vitals:   08/12/22 0847  BP: (!) 176/72  Pulse: 74  Temp: 98.2 F (36.8 C)   There is no height or weight on file to calculate BMI.  Physical Examination  Constitutional: No obvious distress; patient is non-toxic appearing  Cardiovascular: No visible lower extremity edema.  Respiratory: The patient does not have audible wheezing/stridor; respirations do not appear labored  Gastrointestinal: Abdomen non-distended Musculoskeletal: Normal ROM of UEs  Skin: No obvious rashes/open sores  Neurologic: CN 2-12 grossly intact Psychiatric: Answered questions appropriately with normal affect  Hematologic/Lymphatic/Immunologic: No obvious bruises or sites of spontaneous bleeding   ASSESSMENT Benign prostatic hyperplasia, unspecified whether lower urinary tract symptoms present  Postoperative retention of urine  Foley catheter in place - Plan: Bladder Voiding Trial, cephALEXin (KEFLEX) capsule 500 mg  Postop check  S/P TURP  Voiding trial was attempted but he  reportedly had bladder spasms which limited bladder filling. We agreed to leave Foley catheter out for now; he was advised to push fluid intake for the next few hours and return for nurse visit around 1:00 pm today for PVR check.  - If PVR shows adequate bladder emptying (PVR <150 ml), okay to proceed without catheter use. - If unable to adequately empty his bladder (PVR >150 ml), will either proceed with reinsertion of indwelling Foley catheter or he can be taught how to clean intermittent catheterization (CIC).  - If he elects to proceed with CIC would advise that he void volitionally then check his post-void residual (PVR). If PVR is >150 ml, increase frequency of CIC per day. Would recommend starting with CIC 2-3 times per day and go from there.   Will plan for follow up with urology provider in 3 months. Will address OAB-type symptoms persist at that time if needed. Pt verbalized understanding and agreement. All questions were answered.  PLAN Advised the following: 1. As detailed above. 2. Return  in about 3 months (around 11/12/2022) for UA, PVR, & f/u with Evette Georges NP.  Orders Placed This Encounter  Procedures   Bladder Voiding Trial   Total time spent caring for the patient today was over 30 minutes. This includes time spent on the date of the visit reviewing the patient's chart before the visit, time spent during the visit, and time spent after the visit on documentation. Over 50% of that time was spent in face-to-face time with this patient for direct counseling. E&M based on time and complexity of medical decision making.  It has been explained that the patient is to follow regularly with their PCP in addition to all other providers involved in their care and to follow instructions provided by these respective offices. Patient advised to contact urology clinic if any urologic-pertaining questions, concerns, new symptoms or problems arise in the interim period.  There are no Patient  Instructions on file for this visit.  Electronically signed by:  Donnita Falls, FNP   08/12/22    11:56 AM

## 2022-08-12 ENCOUNTER — Ambulatory Visit (INDEPENDENT_AMBULATORY_CARE_PROVIDER_SITE_OTHER): Payer: Medicare Other | Admitting: Urology

## 2022-08-12 VITALS — BP 176/72 | HR 74 | Temp 98.2°F

## 2022-08-12 DIAGNOSIS — Z978 Presence of other specified devices: Secondary | ICD-10-CM

## 2022-08-12 DIAGNOSIS — N400 Enlarged prostate without lower urinary tract symptoms: Secondary | ICD-10-CM | POA: Diagnosis not present

## 2022-08-12 DIAGNOSIS — R338 Other retention of urine: Secondary | ICD-10-CM | POA: Diagnosis not present

## 2022-08-12 DIAGNOSIS — N9989 Other postprocedural complications and disorders of genitourinary system: Secondary | ICD-10-CM | POA: Diagnosis not present

## 2022-08-12 DIAGNOSIS — Z9079 Acquired absence of other genital organ(s): Secondary | ICD-10-CM

## 2022-08-12 DIAGNOSIS — Z09 Encounter for follow-up examination after completed treatment for conditions other than malignant neoplasm: Secondary | ICD-10-CM

## 2022-08-12 MED ORDER — CEPHALEXIN 250 MG PO CAPS
500.0000 mg | ORAL_CAPSULE | Freq: Once | ORAL | Status: AC
Start: 2022-08-12 — End: 2022-08-12
  Administered 2022-08-12: 500 mg via ORAL

## 2022-08-12 NOTE — Progress Notes (Addendum)
Fill and Pull Catheter Removal  Patient is present today for a catheter removal.  Patient was cleaned and prepped in a sterile fashion 30ml of sterile water/ saline was instilled into the bladder when the patient felt the urge to urinate. 8ml of water was then drained from the balloon.  Patient can void  30 ml on their own around catheter. A 16FR foley cath was removed from the bladder no complications were noted .  Patient was then given some time to void on their own.  Per Np catheter was removed.  Performed by: Shreena Baines Lpn   Follow up/ Additional notes: this afternoon for PVR    post void residual= 100  Patient will keep scheduled 3 month f/u

## 2022-08-16 ENCOUNTER — Ambulatory Visit: Payer: Medicare Other | Attending: Cardiology

## 2022-08-16 DIAGNOSIS — Z5181 Encounter for therapeutic drug level monitoring: Secondary | ICD-10-CM | POA: Diagnosis not present

## 2022-08-16 LAB — POCT INR: INR: 3.2 — AB (ref 2.0–3.0)

## 2022-08-16 NOTE — Patient Instructions (Signed)
continue normal dose of warfarin 1 tablet daily except for 1.5 tablets on Tuesday, Thursday and Saturday.  Recheck INR in 6 weeks.   Call us with any medication changes or concerns # [phone] or [phone] Clinic, Main # [phone], Fax #[phone] or [phone]

## 2022-09-11 ENCOUNTER — Ambulatory Visit: Payer: Medicare Other | Attending: Cardiovascular Disease

## 2022-09-11 DIAGNOSIS — Z5181 Encounter for therapeutic drug level monitoring: Secondary | ICD-10-CM | POA: Diagnosis not present

## 2022-09-11 LAB — POCT INR: INR: 3.8 — AB (ref 2.0–3.0)

## 2022-09-11 NOTE — Patient Instructions (Signed)
Description   Take 1 tablet tomorrow and then continue normal dose of warfarin 1 tablet daily except for 1.5 tablets on Tuesday, Thursday and Saturday.  Recheck INR in 4 weeks.   Call us with any medication changes or concerns # [phone] or [phone] Clinic, Main # [phone], Fax #[phone] or [phone]

## 2022-10-09 ENCOUNTER — Ambulatory Visit: Payer: Medicare Other | Admitting: *Deleted

## 2022-10-09 DIAGNOSIS — Z5181 Encounter for therapeutic drug level monitoring: Secondary | ICD-10-CM | POA: Diagnosis not present

## 2022-10-09 LAB — POCT INR: INR: 3.4 — AB (ref 2.0–3.0)

## 2022-10-09 NOTE — Patient Instructions (Addendum)
Description   Continue taking warfarin 1 tablet daily except for 1.5 tablets on Tuesday, Thursday and Saturday. Recheck INR in 4 weeks.   Call us with any medication changes or concerns # [phone] or [phone] Clinic, Main # [phone], Fax #[phone] or [phone]

## 2022-10-20 ENCOUNTER — Other Ambulatory Visit: Payer: Self-pay | Admitting: Cardiology

## 2022-11-06 ENCOUNTER — Ambulatory Visit: Payer: Medicare Other | Attending: Cardiology | Admitting: *Deleted

## 2022-11-06 DIAGNOSIS — Z952 Presence of prosthetic heart valve: Secondary | ICD-10-CM

## 2022-11-06 DIAGNOSIS — Z5181 Encounter for therapeutic drug level monitoring: Secondary | ICD-10-CM

## 2022-11-06 LAB — POCT INR: INR: 3.9 — AB (ref 2.0–3.0)

## 2022-11-06 NOTE — Progress Notes (Signed)
Name: Nathan Frye DOB: [birth year] MRN: [id number]  History of Present Illness: Nathan Frye is a 72 y.o. male who presents today for return visit at [facility] [place]. - GU history: 1. BPH with BOO and retention.  2. Elevated PSA.  - See PSA values below. - 12/15/2021: MRI prostate showed: 1. No high-grade carcinoma the peripheral zone. PI-RADS: 1 2. Enlarged nodular transitional zone most consistent with benign prostate hypertrophy. PI-RADS: 2. 3. Prior episode of prostatitis.  Recent history:  - 07/25/2022: Underwent TURP by Dr. Ronne Binning. Benign pathology. - 08/01/2022: Postop visit. Failed voiding trial.   At last visit with on 08/12/2022: - Voiding trial was attempted but he reportedly had bladder spasms which limited bladder filling. We agreed to leave Foley catheter out for now.  Since last visit: - 09/24/2022: PSA within normal range (1.7).  Today: He reports that his urinary symptoms have been much improved since surgery. He reports solid urinary stream. He denies urinary hesitancy, urgency, frequency, dysuria, gross hematuria, straining to void, or sensations of incomplete emptying.  PSA values: - 09/15/2007: 1.7 - 05/02/2009: 1.8 - 05/01/2010: 2.1 - 02/19/2012: 4.3 - 01/05/2014: 3.74 - 01/06/2019: 10.8 - 04/12/2020: 7.0 - 10/11/2020: 8.0 - 03/22/2021: 8.9 - 04/18/2021: 8.9 - 09/17/2021: 9.2 - 10/31/2021: 9.1 - 12/18/2021: 7.2 - 06/12/2022: 40.9 - 09/24/2022: 1.7  Fall Screening: Do you usually have a device to assist in your mobility? No   Medications: Current Outpatient Medications  Medication Sig Dispense Refill   Ergocalciferol 2000 UNITS TABS Take 2,000 Units by mouth daily.     ferrous sulfate 325 (65 FE) MG EC tablet Take 1 tablet by mouth daily.     finasteride (PROSCAR) 5 MG tablet Take 1 tablet (5 mg total) by mouth daily. 90 tablet 3   hydrochlorothiazide (HYDRODIURIL) 25 MG tablet Take 1 tablet (25 mg total) by mouth daily.  90 tablet 3   HYDROcodone-acetaminophen (NORCO) 5-325 MG tablet Take 1 tablet by mouth every 6 (six) hours as needed for moderate pain. (Patient not taking: Reported on 08/12/2022) 30 tablet 0   latanoprost (XALATAN) 0.005 % ophthalmic solution 1 drop at bedtime.     losartan (COZAAR) 100 MG tablet Take 1 tablet (100 mg total) by mouth daily. 90 tablet 3   metoprolol succinate (TOPROL-XL) 25 MG 24 hr tablet TAKE 1 TABLET (25 MG TOTAL) BY MOUTH DAILY. 90 tablet 3   rosuvastatin (CRESTOR) 10 MG tablet Take 1 tablet (10 mg total) by mouth daily. 90 tablet 2   warfarin (COUMADIN) 5 MG tablet TAKE 1 TO 1 AND 1/2 TABLETS DAILY AS DIRECTED BY COUMADIN CLINIC 135 tablet 1   Wheat Dextrin (BENEFIBER) POWD Take 1 Scoop by mouth daily.     No current facility-administered medications for this visit.    Allergies: Allergies  Allergen Reactions   Ciprofloxacin     Patient states he does not want to take Cipro anymore do to an interaction with the coumadin he is chronically taking. Patient request that a note be placed in his chart to not give him Cipro.    Past Medical History:  Diagnosis Date   Chronic anticoagulation    HTN (hypertension)    Hypercholesteremia    Severe aortic stenosis    Past Surgical History:  Procedure Laterality Date   AORTIC VALVE REPLACEMENT  2007   #21 MM [facility] REGENT VALVE   TRANSURETHRAL RESECTION OF PROSTATE N/A 07/25/2022   Procedure: TRANSURETHRAL RESECTION OF THE PROSTATE (TURP);  Surgeon: Malen Gauze, MD;  Location: [facility];  Service: Urology;  Laterality: N/A;   VASECTOMY     Family History  Problem Relation Age of Onset   Hypertension Mother    Social History   Socioeconomic History   Marital status: Widowed    Spouse name: Not on file   Number of children: 1   Years of education: Not on file   Highest education level: Not on file  Occupational History   Occupation: Photographer: [employer]  Tobacco Use   Smoking status:  Never   Smokeless tobacco: Never  Vaping Use   Vaping status: Never Used  Substance and Sexual Activity   Alcohol use: No    Alcohol/week: 0.0 standard drinks of alcohol   Drug use: No   Sexual activity: Not Currently  Other Topics Concern   Not on file  Social History Narrative   Not on file   Social Determinants of Health   Financial Resource Strain: Low Risk  (09/22/2022)   Received from [facility]   Overall Financial Resource Strain (CARDIA)    Difficulty of Paying Living Expenses: Not hard at all  Food Insecurity: No Food Insecurity (09/22/2022)   Received from [facility]   Hunger Vital Sign    Worried About Running Out of Food in the Last Year: Never true    Ran Out of Food in the Last Year: Never true  Transportation Needs: No Transportation Needs (09/22/2022)   Received from [facility] - Transportation    Lack of Transportation (Medical): No    Lack of Transportation (Non-Medical): No  Physical Activity: Sufficiently Active (09/22/2022)   Received from [facility]   Exercise Vital Sign    Days of Exercise per Week: 5 days    Minutes of Exercise per Session: 90 min  Stress: No Stress Concern Present (09/22/2022)   Received from [facility] - Occupational Stress Questionnaire    Feeling of Stress : Not at all  Social Connections: Socially Integrated (09/22/2022)   Received from [facility]   Social Network    How would you rate your social network (family, work, friends)?: Good participation with social networks  Intimate Partner Violence: Not At Risk (09/22/2022)   Received from [facility]   HITS    Over the last 12 months how often did your partner physically hurt you?: 1    Over the last 12 months how often did your partner insult you or talk down to you?: 1    Over the last 12 months how often did your partner threaten you with physical harm?: 1    Over the last 12 months how often did your  partner scream or curse at you?: 1    Review of Systems Constitutional: Patient denies any unintentional weight loss or change in strength lntegumentary: Patient denies any rashes or pruritus Cardiovascular: Patient denies chest pain or syncope Respiratory: Patient denies shortness of breath Gastrointestinal: Patient denies nausea, vomiting, constipation, or diarrhea Musculoskeletal: Patient denies muscle cramps or weakness Neurologic: Patient denies convulsions or seizures Psychiatric: Patient denies memory problems Allergic/Immunologic: Patient denies recent allergic reaction(s) Hematologic/Lymphatic: Patient denies bleeding tendencies Endocrine: Patient denies heat/cold intolerance  GU: As per HPI.  OBJECTIVE Vitals:   11/12/22 0924 11/12/22 0929  BP: (!) 153/66 (!) 147/72  Pulse:  61  Temp:  98.1 F (36.7 C)   There is no height or weight  on file to calculate BMI.  Physical Examination Constitutional: No obvious distress; patient is non-toxic appearing  Cardiovascular: No visible lower extremity edema.  Respiratory: The patient does not have audible wheezing/stridor; respirations do not appear labored  Gastrointestinal: Abdomen non-distended Musculoskeletal: Normal ROM of UEs  Skin: No obvious rashes/open sores  Neurologic: CN 2-12 grossly intact Psychiatric: Answered questions appropriately with normal affect  Hematologic/Lymphatic/Immunologic: No obvious bruises or sites of spontaneous bleeding  UA: >30 RBC/hpf with no evidence of UTI PVR: 0 ml  ASSESSMENT Benign prostatic hyperplasia, unspecified whether lower urinary tract symptoms present - Plan: Urinalysis, Routine w reflex microscopic, BLADDER SCAN AMB NON-IMAGING  Elevated PSA  S/P TURP  Microscopic hematuria - Plan: CT HEMATURIA WORKUP  He is doing well symptomatically however he has evidence of microscopic hematuria per today's UA. His renal function was normal in June 2024 and there has been no  evidence of prostate malignancy per PSA & pathology. He is a non-smoker.  For asymptomatic microscopic hematuria we discussed possible etiologies including but not limited to: vigorous exercise, sexual activity, stone, trauma, blood thinner use, urinary tract infection, urethral irritation secondary to chronic kidney disease, glomerulonephropathy, BPH, malignancy.  We reviewed the AUA 2020 [facility] guideline and risk stratification for this patient. Based on individual risk factors, pt was advised that the recommended workup includes CT urogram. Pt decided to pursue this work-up and follow-up afterward to discuss the results and formulate a treatment plan based on the findings. All questions were answered.   PLAN Advised the following: 1. CT. 2. Return in about 3 months (around 02/12/2023) for f/u with Dr. Ronne Binning.  Orders Placed This Encounter  Procedures   CT HEMATURIA WORKUP    Standing Status:   Future    Standing Expiration Date:   11/12/2023    Order Specific Question:   Reason for Exam (SYMPTOM  OR DIAGNOSIS REQUIRED)    Answer:   Microscopic hematuria    Order Specific Question:   Preferred imaging location?    Answer:   [facility]   Urinalysis, Routine w reflex microscopic   BLADDER SCAN AMB NON-IMAGING    It has been explained that the patient is to follow regularly with their PCP in addition to all other providers involved in their care and to follow instructions provided by these respective offices. Patient advised to contact urology clinic if any urologic-pertaining questions, concerns, new symptoms or problems arise in the interim period.  There are no Patient Instructions on file for this visit.  Electronically signed by:  Donnita Falls, FNP   11/12/22    10:20 AM

## 2022-11-06 NOTE — Patient Instructions (Addendum)
Description   Do not take any warfarin tomorrow then continue taking warfarin 1 tablet daily except for 1.5 tablets on Tuesday, Thursday and Saturday. Recheck INR in 4 weeks.   Call us with any medication changes or concerns # [phone] or [phone] Cou[facility]in Clinic, Main # [phone], Fax #[phone] or [phone]

## 2022-11-12 ENCOUNTER — Encounter: Payer: Self-pay | Admitting: Urology

## 2022-11-12 ENCOUNTER — Ambulatory Visit (INDEPENDENT_AMBULATORY_CARE_PROVIDER_SITE_OTHER): Payer: Medicare Other | Admitting: Urology

## 2022-11-12 VITALS — BP 147/72 | HR 61 | Temp 98.1°F

## 2022-11-12 DIAGNOSIS — Z9079 Acquired absence of other genital organ(s): Secondary | ICD-10-CM

## 2022-11-12 DIAGNOSIS — R972 Elevated prostate specific antigen [PSA]: Secondary | ICD-10-CM

## 2022-11-12 DIAGNOSIS — N400 Enlarged prostate without lower urinary tract symptoms: Secondary | ICD-10-CM | POA: Diagnosis not present

## 2022-11-12 DIAGNOSIS — R3129 Other microscopic hematuria: Secondary | ICD-10-CM

## 2022-11-12 LAB — URINALYSIS, ROUTINE W REFLEX MICROSCOPIC
Bilirubin, UA: NEGATIVE
Glucose, UA: NEGATIVE
Ketones, UA: NEGATIVE
Nitrite, UA: NEGATIVE
Protein,UA: NEGATIVE
Specific Gravity, UA: 1.02 (ref 1.005–1.030)
Urobilinogen, Ur: 0.2 mg/dL (ref 0.2–1.0)
pH, UA: 7 (ref 5.0–7.5)

## 2022-11-12 LAB — MICROSCOPIC EXAMINATION
Bacteria, UA: NONE SEEN
RBC, Urine: >30 /[HPF] — AB (ref 0–2)

## 2022-11-12 NOTE — Progress Notes (Signed)
post void residual=0 ?

## 2022-11-15 ENCOUNTER — Telehealth: Payer: Self-pay

## 2022-11-15 ENCOUNTER — Ambulatory Visit (HOSPITAL_COMMUNITY)
Admission: RE | Admit: 2022-11-15 | Discharge: 2022-11-15 | Disposition: A | Discharge status: Home / Self Care | Payer: Medicare Other | Source: Ambulatory Visit | Attending: Urology | Admitting: Urology

## 2022-11-15 DIAGNOSIS — R3129 Other microscopic hematuria: Secondary | ICD-10-CM | POA: Diagnosis present

## 2022-11-15 MED ORDER — IOHEXOL 350 MG/ML SOLN
100.0000 mL | Freq: Once | INTRAVENOUS | Status: AC | PRN
  Administered 2022-11-15: 100 mL via INTRAVENOUS

## 2022-11-15 NOTE — Telephone Encounter (Signed)
Medicare primary BCBS state secondary approved and placed in chart in referral   Returned call to pre service number left-  Melanie  notified at pre service.

## 2022-11-15 NOTE — Telephone Encounter (Signed)
Melody from radiology calling to get a Pre certified Authorization for this patient's CT Hematuria Workup. Patient's Appointment is today at 12:30 pm.  [phone]  Ext. [phone]

## 2022-11-17 LAB — POCT I-STAT CREATININE: Creatinine, Ser: 1.3 mg/dL — ABNORMAL HIGH (ref 0.61–1.24)

## 2022-12-04 ENCOUNTER — Ambulatory Visit: Payer: Medicare Other | Attending: Cardiology

## 2022-12-04 DIAGNOSIS — Z5181 Encounter for therapeutic drug level monitoring: Secondary | ICD-10-CM | POA: Insufficient documentation

## 2022-12-04 LAB — POCT INR: INR: 3.7 — AB (ref 2.0–3.0)

## 2022-12-04 NOTE — Patient Instructions (Signed)
Description   Only take 1/2 tablet today and then continue taking warfarin 1 tablet daily except for 1.5 tablets on Tuesday, Thursday and Saturday.  Stay consistent with greens each week  Recheck INR in 4 weeks.   Call us with any medication changes or concerns #[phone] [facility] Main # [phone], Fax #[phone] or [phone]

## 2022-12-16 NOTE — Progress Notes (Unsigned)
Nathan Frye Date of Birth: 23-Oct-1950   History of Present Illness: Nathan Frye is seen today for followup of his aortic valve disease. 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 Last Echo in July 2017 showed normal valve function.   On follow up today he is doing very well. He denies any  symptoms of chest pain, shortness of breath, or palpitations. He continues to exercise regularly 5 days a week. He is retired but helping out some. He is being evaluated by urology for elevated PSA. Biopsy in 2021 was OK. He did end up getting TURP in June. Did have bleeding requiring transfusion. Treated for UTI. Doing well now. BP has been doing very well.     Current Outpatient Medications on File Prior to Visit  Medication Sig Dispense Refill   Ergocalciferol 2000 UNITS TABS Take 2,000 Units by mouth daily.     ferrous sulfate 325 (65 FE) MG EC tablet Take 1 tablet by mouth daily.     finasteride (PROSCAR) 5 MG tablet Take 1 tablet (5 mg total) by mouth daily. 90 tablet 3   HYDROcodone-acetaminophen (NORCO) 5-325 MG tablet Take 1 tablet by mouth every 6 (six) hours as needed for moderate pain. 30 tablet 0   latanoprost (XALATAN) 0.005 % ophthalmic solution 1 drop at bedtime.     losartan (COZAAR) 100 MG tablet Take 1 tablet (100 mg total) by mouth daily. 90 tablet 3   metoprolol succinate (TOPROL-XL) 25 MG 24 hr tablet TAKE 1 TABLET (25 MG TOTAL) BY MOUTH DAILY. 90 tablet 3   rosuvastatin (CRESTOR) 10 MG tablet Take 1 tablet (10 mg total) by mouth daily. 90 tablet 2   warfarin (COUMADIN) 5 MG tablet TAKE 1 TO 1 AND 1/2 TABLETS DAILY AS DIRECTED BY COUMADIN CLINIC 135 tablet 1   Wheat Dextrin (BENEFIBER) POWD Take 1 Scoop by mouth daily.     hydrochlorothiazide (HYDRODIURIL) 25 MG tablet Take 1 tablet (25 mg total) by mouth daily. 90 tablet 3   No current facility-administered medications on file prior to visit.    Allergies  Allergen Reactions   Ciprofloxacin      Patient states he does not want to take Cipro anymore do to an interaction with the coumadin he is chronically taking. Patient request that a note be placed in his chart to not give him Cipro.    Past Medical History:  Diagnosis Date   Chronic anticoagulation    HTN (hypertension)    Hypercholesteremia    Severe aortic stenosis     Past Surgical History:  Procedure Laterality Date   AORTIC VALVE REPLACEMENT  2007   #21 MM [facility] JUDE REGENT VALVE   TRANSURETHRAL RESECTION OF PROSTATE N/A 07/25/2022   Procedure: TRANSURETHRAL RESECTION OF THE PROSTATE (TURP);  Surgeon: Malen Gauze, MD;  Location: [facility];  Service: Urology;  Laterality: N/A;   VASECTOMY      Social History   Tobacco Use  Smoking Status Never  Smokeless Tobacco Never    Social History   Substance and Sexual Activity  Alcohol Use No   Alcohol/week: 0.0 standard drinks of alcohol    Family History  Problem Relation Age of Onset   Hypertension Mother     Review of Systems: As noted history of present illness.  All other systems were reviewed and are negative.  Physical Exam: BP (!) 143/75 (BP Location: Left Arm, Patient Position: Sitting, Cuff Size: Normal)   Pulse 71  Ht 5\' 9"  (1.753 m)   Wt 167 lb (75.8 kg)   SpO2 97%   BMI 24.66 kg/m  GENERAL:  Well appearing BM in NAD HEENT:  PERRL, EOMI, sclera are clear. Oropharynx is clear. NECK:  No jugular venous distention, carotid upstroke brisk and symmetric, no bruits, no thyromegaly or adenopathy LUNGS:  Clear to auscultation bilaterally CHEST:  Unremarkable HEART:  RRR,  PMI not displaced or sustained,S1 and S2 within normal limits, no S3, no S4: good mechanical AV click. Soft 1/6 systolic murmur at the apex. ABD:  Soft, nontender. BS +, no masses or bruits. No hepatomegaly, no splenomegaly EXT:  2 + pulses throughout, no edema, no cyanosis no clubbing SKIN:  Warm and dry.  No rashes NEURO:  Alert and oriented x 3. Cranial nerves II through  XII intact. PSYCH:  Cognitively intact   LABORATORY DATA: Lab Results  Component Value Date   WBC 10.6 (H) 07/26/2022   HGB 11.0 (L) 07/26/2022   HCT 33.7 (L) 07/26/2022   PLT 264 07/26/2022   GLUCOSE 115 (H) 07/26/2022   CHOL 163 05/02/2011   TRIG 75.0 05/02/2011   HDL 67.00 05/02/2011   LDLCALC 81 05/02/2011   ALT 24 10/02/2018   AST 38 10/02/2018   NA 135 07/26/2022   K 3.7 07/26/2022   CL 102 07/26/2022   CREATININE 1.30 (H) 11/15/2022   BUN 14 07/26/2022   CO2 26 07/26/2022   INR 3.0 12/19/2022     LABS reviewed from 04/27/15. Hgb 13.1. CMET normal, Cholesterol 167, Triglycerides 80, HDL 80, LDL 71. TSH normal. From 06/19/16: cholesterol 165, triglycerides 53. HDL 77, LDL 77. Hgb 13.2. CMET and TSH normal. Dated 09/16/17: Hgb 12.7. CMET normal. Cholesterol 175, triglycerides 64, HDL 79, LDL 83. TSH normal.  Dated 01/06/19: creatinine 1.25. otherwise CMET normal Dated 07/29/19: cholesterol 172, triglycerides 56, HDL 68, LDL 93. Hgb 12.2. Dated 01/31/20: cholesterol 170, triglycerides 71, HDL 68, LDL 88. Creatinine 1.2 otherwise CMET normal. Dated 09/17/21: Hgb 10.3, BUN 10, creatinine 1.31, otherwise BMET normal. PSA 9.2. cholesterol 160, triglycerides 52, HDL 63, LDL 86   EKG Interpretation Date/Time:  Thursday December 19 2022 08:58:00 EST Ventricular Rate:  71 PR Interval:  162 QRS Duration:  90 QT Interval:  410 QTC Calculation: 445 R Axis:   38  Text Interpretation: Normal sinus rhythm Possible Left atrial enlargement Minimal voltage criteria for LVH, may be normal variant ( Sokolow-Lyon ) When compared with ECG of 28-Jun-2005 08:02, ST now depressed in Inferior leads ST no longer elevated in Anterior leads T wave inversion no longer evident in Lateral leads Confirmed by [place], Rhonna Holster [phone]) on 12/19/2022 9:22:20 AM     Echo: 09/11/15: Study Conclusions   - Left ventricle: The cavity size was normal. Wall thickness was   normal. Systolic function was normal. The  estimated ejection   fraction was in the range of 55% to 60%. Wall motion was normal;   there were no regional wall motion abnormalities. Left   ventricular diastolic function parameters were normal. - Aortic valve: A mechanical prosthesis was present and functioning   normally. There was trivial regurgitation. Valve area (VTI): 1.58   cm^2. Valve area (Vmax): 1.62 cm^2. Valve area (Vmean): 1.72   cm^2. - Mitral valve: There was mild regurgitation.    Assessment / Plan:  1. Status post mechanical aortic valve replacement 2007. Satisfactory Echo 2017.  Clinically stable. Continue chronic anticoagulation.  Routine SBE prophylaxis. I will followup again in one year  2. Hypertension, BP has been good. Continue losartan, HCT, metoprolol  3. Hypercholesterolemia-on Crestor. good control.  4. s/p TURP  5. Elevated PSA. S/p TURP

## 2022-12-19 ENCOUNTER — Ambulatory Visit: Payer: Medicare Other

## 2022-12-19 ENCOUNTER — Ambulatory Visit: Payer: Medicare Other | Attending: Cardiology | Admitting: Cardiology

## 2022-12-19 ENCOUNTER — Encounter: Payer: Self-pay | Admitting: Cardiology

## 2022-12-19 VITALS — BP 143/75 | HR 71 | Ht 69.0 in | Wt 167.0 lb

## 2022-12-19 DIAGNOSIS — E7800 Pure hypercholesterolemia, unspecified: Secondary | ICD-10-CM

## 2022-12-19 DIAGNOSIS — Z952 Presence of prosthetic heart valve: Secondary | ICD-10-CM

## 2022-12-19 DIAGNOSIS — Z5181 Encounter for therapeutic drug level monitoring: Secondary | ICD-10-CM

## 2022-12-19 DIAGNOSIS — I10 Essential (primary) hypertension: Secondary | ICD-10-CM

## 2022-12-19 LAB — POCT INR: INR: 3 (ref 2.0–3.0)

## 2022-12-19 NOTE — Patient Instructions (Signed)
Medication Instructions:  Continue same medications *If you need a refill on your cardiac medications before your next appointment, please call your pharmacy*   Lab Work: None ordered   Testing/Procedures: None ordered   Follow-Up: At [facility], you and your health needs are our priority.  As part of our continuing mission to provide you with exceptional heart care, we have created designated Provider Care Teams.  These Care Teams include your primary Cardiologist (physician) and Advanced Practice Providers (APPs -  Physician Assistants and Nurse Practitioners) who all work together to provide you with the care you need, when you need it.  We recommend signing up for the patient portal called "MyChart".  Sign up information is provided on this After Visit Summary.  MyChart is used to connect with patients for Virtual Visits (Telemedicine).  Patients are able to view lab/test results, encounter notes, upcoming appointments, etc.  Non-urgent messages can be sent to your provider as well.   To learn more about what you can do with MyChart, go to [email or web address].    Your next appointment:  1 year  Call in July to schedule Nov appointment    Provider:  Dr.Jordan

## 2022-12-19 NOTE — Patient Instructions (Signed)
Description   Continue taking warfarin 1 tablet daily except for 1.5 tablets on Tuesday, Thursday and Saturday. Stay consistent with greens each week  Recheck INR in 4 weeks.   Call us with any medication changes or concerns #[phone] [facility] Main # [phone], Fax #[phone] or [phone]

## 2023-01-15 ENCOUNTER — Other Ambulatory Visit: Payer: Self-pay | Admitting: Cardiology

## 2023-01-16 ENCOUNTER — Telehealth: Payer: Self-pay | Admitting: Cardiology

## 2023-01-16 ENCOUNTER — Ambulatory Visit: Payer: Medicare Other | Attending: Internal Medicine | Admitting: *Deleted

## 2023-01-16 DIAGNOSIS — Z5181 Encounter for therapeutic drug level monitoring: Secondary | ICD-10-CM | POA: Diagnosis not present

## 2023-01-16 DIAGNOSIS — Z952 Presence of prosthetic heart valve: Secondary | ICD-10-CM | POA: Insufficient documentation

## 2023-01-16 LAB — POCT INR: INR: 3 (ref 2.0–3.0)

## 2023-01-16 MED ORDER — LOSARTAN POTASSIUM 100 MG PO TABS: 100.0000 mg | ORAL_TABLET | Freq: Every day | ORAL | 3 refills | Status: DC

## 2023-01-16 NOTE — Patient Instructions (Signed)
Description   Continue taking warfarin 1 tablet daily except for 1.5 tablets on Tuesday, Thursday and Saturday. Stay consistent with greens each week  Recheck INR in 5 weeks.   Call us with any medication changes or concerns #[phone] [facility]in Clinic Main # [phone], Fax #[phone] or [phone]

## 2023-01-16 NOTE — Telephone Encounter (Signed)
 RX sent to requested Pharmacy

## 2023-01-16 NOTE — Telephone Encounter (Signed)
° ° °*  STAT* If patient is at the pharmacy, call can be transferred to refill team.   1. Which medications need to be refilled? (please list name of each medication and dose if known) losartan (COZAAR) 100 MG tablet 2. Which pharmacy/location (including street and city if local pharmacy) is medication to be sent to?[id number] - [place], [place] - [address]  3. Do they need a 30 day or 90 day supply? [place]

## 2023-02-14 ENCOUNTER — Ambulatory Visit: Payer: Medicare PPO | Admitting: Urology

## 2023-02-14 VITALS — BP 164/72 | HR 67

## 2023-02-14 DIAGNOSIS — R339 Retention of urine, unspecified: Secondary | ICD-10-CM

## 2023-02-14 DIAGNOSIS — R3129 Other microscopic hematuria: Secondary | ICD-10-CM

## 2023-02-14 DIAGNOSIS — R972 Elevated prostate specific antigen [PSA]: Secondary | ICD-10-CM

## 2023-02-14 DIAGNOSIS — N400 Enlarged prostate without lower urinary tract symptoms: Secondary | ICD-10-CM | POA: Diagnosis not present

## 2023-02-14 LAB — URINALYSIS, ROUTINE W REFLEX MICROSCOPIC
Bilirubin, UA: NEGATIVE
Glucose, UA: NEGATIVE
Ketones, UA: NEGATIVE
Leukocytes,UA: NEGATIVE
Nitrite, UA: NEGATIVE
Protein,UA: NEGATIVE
RBC, UA: NEGATIVE
Specific Gravity, UA: 1.02 (ref 1.005–1.030)
Urobilinogen, Ur: 0.2 mg/dL (ref 0.2–1.0)
pH, UA: 6.5 (ref 5.0–7.5)

## 2023-02-14 NOTE — Progress Notes (Signed)
 02/14/2023 11:24 AM   Nathan Frye Dec 27, 1950 [id number]  Referring provider: Reita Locus, MD [address] [place],  [place] [address]  No chief complaint on file.   HPI: [name] is a 73yo here for followup for BPH with urinary retention and elevated PSA. He has been doing well after TURP. No recent PSA. IPSS 4 QOl 1. Urine stream strong. No straining to urinate. No other complaints today   PMH: Past Medical History:  Diagnosis Date   Chronic anticoagulation    HTN (hypertension)    Hypercholesteremia    Severe aortic stenosis     Surgical History: Past Surgical History:  Procedure Laterality Date   AORTIC VALVE REPLACEMENT  2007   #21 MM [facility] VALVE   TRANSURETHRAL RESECTION OF PROSTATE N/A 07/25/2022   Procedure: TRANSURETHRAL RESECTION OF THE PROSTATE (TURP);  Surgeon: Sherrilee Belvie LITTIE, MD;  Location: [facility];  Service: Urology;  Laterality: N/A;   VASECTOMY      Home Medications:  Allergies as of 02/14/2023       Reactions   Ciprofloxacin     Patient states he does not want to take Cipro  anymore do to an interaction with the coumadin  he is chronically taking. Patient request that a note be placed in his chart to not give him Cipro .        Medication List        Accurate as of February 14, 2023 11:24 AM. If you have any questions, ask your nurse or doctor.          Benefiber Powd Take 1 Scoop by mouth daily.   Ergocalciferol 50 MCG (2000 UT) Tabs Take 2,000 Units by mouth daily.   ferrous sulfate  325 (65 FE) MG EC tablet Take 1 tablet by mouth daily.   finasteride  5 MG tablet Commonly known as: PROSCAR  Take 1 tablet (5 mg total) by mouth daily.   hydrochlorothiazide  25 MG tablet Commonly known as: HYDRODIURIL  Take 1 tablet (25 mg total) by mouth daily.   HYDROcodone -acetaminophen  5-325 MG tablet Commonly known as: Norco Take 1 tablet by mouth every 6 (six) hours as needed for moderate pain.   latanoprost  0.005 %  ophthalmic solution Commonly known as: XALATAN  1 drop at bedtime.   losartan  100 MG tablet Commonly known as: COZAAR  Take 1 tablet (100 mg total) by mouth daily.   metoprolol  succinate 25 MG 24 hr tablet Commonly known as: TOPROL -XL TAKE 1 TABLET (25 MG TOTAL) BY MOUTH DAILY.   rosuvastatin  10 MG tablet Commonly known as: CRESTOR  TAKE 1 TABLET BY MOUTH EVERY DAY   warfarin 5 MG tablet Commonly known as: COUMADIN  Take as directed by the anticoagulation clinic. If you are unsure how to take this medication, talk to your nurse or doctor. Original instructions: TAKE 1 TO 1 AND 1/2 TABLETS DAILY AS DIRECTED BY COUMADIN  CLINIC        Allergies:  Allergies  Allergen Reactions   Ciprofloxacin      Patient states he does not want to take Cipro  anymore do to an interaction with the coumadin  he is chronically taking. Patient request that a note be placed in his chart to not give him Cipro .    Family History: Family History  Problem Relation Age of Onset   Hypertension Mother     Social History:  reports that he has never smoked. He has never used smokeless tobacco. He reports that he does not drink alcohol and does not use drugs.  ROS: All other  review of systems were reviewed and are negative except what is noted above in HPI  Physical Exam: BP (!) 164/72   Pulse 67   Constitutional:  Alert and oriented, No acute distress. HEENT: [place] AT, moist mucus membranes.  Trachea midline, no masses. Cardiovascular: No clubbing, cyanosis, or edema. Respiratory: Normal respiratory effort, no increased work of breathing. GI: Abdomen is soft, nontender, nondistended, no abdominal masses GU: No CVA tenderness.  Lymph: No cervical or inguinal lymphadenopathy. Skin: No rashes, bruises or suspicious lesions. Neurologic: Grossly intact, no focal deficits, moving all 4 extremities. Psychiatric: Normal mood and affect.  Laboratory Data: Lab Results  Component Value Date   WBC 10.6 (H)  07/26/2022   HGB 11.0 (L) 07/26/2022   HCT 33.7 (L) 07/26/2022   MCV 89.2 07/26/2022   PLT 264 07/26/2022    Lab Results  Component Value Date   CREATININE 1.30 (H) 11/15/2022    No results found for: PSA  No results found for: TESTOSTERONE  No results found for: HGBA1C  Urinalysis    Component Value Date/Time   COLORURINE YELLOW 06/04/2022 1607   APPEARANCEUR Cloudy (A) 11/12/2022 0932   LABSPEC 1.012 06/04/2022 1607   PHURINE 5.0 06/04/2022 1607   GLUCOSEU Negative 11/12/2022 0932   HGBUR LARGE (A) 06/04/2022 1607   BILIRUBINUR Negative 11/12/2022 0932   KETONESUR NEGATIVE 06/04/2022 1607   PROTEINUR Negative 11/12/2022 0932   PROTEINUR 30 (A) 06/04/2022 1607   NITRITE Negative 11/12/2022 0932   NITRITE NEGATIVE 06/04/2022 1607   LEUKOCYTESUR Trace (A) 11/12/2022 0932   LEUKOCYTESUR MODERATE (A) 06/04/2022 1607    Lab Results  Component Value Date   LABMICR See below: 11/12/2022   WBCUA 0-5 11/12/2022   LABEPIT 0-10 11/12/2022   BACTERIA None seen 11/12/2022    Pertinent Imaging:  No results found for this or any previous visit.  No results found for this or any previous visit.  No results found for this or any previous visit.  No results found for this or any previous visit.  No results found for this or any previous visit.  No results found for this or any previous visit.  Results for orders placed during the hospital encounter of 11/15/22  CT HEMATURIA WORKUP  Narrative CLINICAL DATA:  Microscopic hematuria  EXAM: CT ABDOMEN AND PELVIS WITHOUT AND WITH CONTRAST  TECHNIQUE: Multidetector CT imaging of the abdomen and pelvis was performed following the standard protocol before and following the bolus administration of intravenous contrast.  RADIATION DOSE REDUCTION: This exam was performed according to the departmental dose-optimization program which includes automated exposure control, adjustment of the mA and/or kV according  to patient size and/or use of iterative reconstruction technique.  CONTRAST:  OMNIPAQUE  IOHEXOL  350 MG/ML SOLN  COMPARISON:  None Available.  FINDINGS: Lower chest: Right lower lobe calcified granuloma.  A 2-3 mm left lower lobe pulmonary nodule can be presumed benign and do/does not warrant imaging follow-up per Fleischner criteria. Normal heart size without pericardial or pleural effusion. Median sternotomy.  Hepatobiliary: Normal liver. Normal gallbladder, without biliary ductal dilatation.  Pancreas: Normal, without mass or ductal dilatation.  Spleen: Old granulomatous disease within. Subcentimeter low-density splenic lesion is most likely a cyst and of no clinical significance.  Adrenals/Urinary Tract: Normal adrenal glands. No renal calculi or hydronephrosis. No hydroureter or ureteric calculi. No bladder calculi.  No suspicious renal mass on post-contrast imaging. Upper pole left renal too small to characterize lesion is most likely a cyst . In the absence  of clinically indicated signs/symptoms require(s) no independent follow-up.  Good renal collecting system opacification on delayed images. Good ureteric opacification, without filling defect.  No enhancing bladder mass or filling defect on delayed images. Minimal impression of the prostatic median lobe into the urinary bladder.  Stomach/Bowel: Normal stomach, without wall thickening. Colonic stool burden suggests constipation. The appendix is moderately enlarged, including at up to 11 mm on 43/205. Also coronal image 72/204. No significant periappendiceal edema.  Normal terminal ileum.  Normal small bowel.  Vascular/Lymphatic: Aortic atherosclerosis. No abdominopelvic adenopathy.  Reproductive: Mild prostatomegaly. Heterogeneity within is likely related to benign prostatic hyperplasia, as on 12/15/2021 MRI.  Other: No significant free fluid.  Musculoskeletal: No acute osseous  abnormality.  IMPRESSION: 1.  No acute process or explanation for hematuria. 2. Enlargement of the appendix without periappendiceal edema. Mucocele of the appendix cannot be excluded. Consider correlation with colonoscopy with attention to the appendiceal orifice. Also correlate with right lower quadrant symptoms, as early appendicitis could have this appearance but is felt less likely. 3.  Aortic Atherosclerosis (ICD10-I70.0).  These results will be called to the ordering clinician or representative by the Radiologist Assistant, and communication documented in the PACS or [employer].   Electronically Signed By: Rockey Kilts M.D. On: 11/28/2022 10:22  No results found for this or any previous visit.   Assessment & Plan:    1.  Elevated PSA PSA today, will call with results -followup 6 months with a PSA  2. Benign prostatic hyperplasia, unspecified whether lower urinary tract symptoms present Improved after TURP  3. Urinary retention resolved   No follow-ups on file.  Belvie Clara, MD  [facility] 

## 2023-02-15 LAB — PSA: Prostate Specific Ag, Serum: 1.3 ng/mL (ref 0.0–4.0)

## 2023-02-16 ENCOUNTER — Other Ambulatory Visit: Payer: Self-pay | Admitting: Cardiology

## 2023-02-18 ENCOUNTER — Telehealth: Payer: Self-pay | Admitting: Cardiology

## 2023-02-18 MED ORDER — HYDROCHLOROTHIAZIDE 25 MG PO TABS: 25.0000 mg | ORAL_TABLET | Freq: Every day | ORAL | 3 refills | Status: DC

## 2023-02-18 NOTE — Telephone Encounter (Signed)
*  STAT* If patient is at the pharmacy, call can be transferred to refill team.   1. Which medications need to be refilled? (please list name of each medication and dose if known) hydrochlorothiazide  (HYDRODIURIL ) 25 MG tablet (Expired)    2. Would you like to learn more about the convenience, safety, & potential cost savings by using the [facility]?     3. Are you open to using the [facility] (Type [facility]. ).   4. Which pharmacy/location (including street and city if local pharmacy) is medication to be sent to? [id number] - [place],  - [address]     5. Do they need a 30 day or 90 day supply? 90

## 2023-02-20 ENCOUNTER — Ambulatory Visit: Payer: Medicare PPO | Attending: Cardiology

## 2023-02-20 DIAGNOSIS — Z5181 Encounter for therapeutic drug level monitoring: Secondary | ICD-10-CM | POA: Diagnosis not present

## 2023-02-20 LAB — POCT INR: INR: 3.7 — AB (ref 2.0–3.0)

## 2023-02-20 NOTE — Patient Instructions (Signed)
 Description   Only take 1 tablet today and then continue taking warfarin 1 tablet daily except for 1.5 tablets on Tuesday, Thursday and Saturday. Stay consistent with greens each week  Recheck INR in 5 weeks.   Call us  with any medication changes or concerns [phone] Coumadin  Clinic Main # [phone], Fax #[phone] or [phone]

## 2023-02-23 ENCOUNTER — Encounter: Payer: Self-pay | Admitting: Urology

## 2023-02-23 NOTE — Patient Instructions (Signed)

## 2023-03-27 ENCOUNTER — Ambulatory Visit: Payer: Medicare PPO | Attending: Cardiovascular Disease

## 2023-03-27 DIAGNOSIS — Z5181 Encounter for therapeutic drug level monitoring: Secondary | ICD-10-CM

## 2023-03-27 LAB — POCT INR: INR: 3.1 — AB (ref 2.0–3.0)

## 2023-03-27 NOTE — Patient Instructions (Signed)
Description   Continue taking warfarin 1 tablet daily except for 1.5 tablets on Tuesday, Thursday and Saturday. Stay consistent with greens each week  Recheck INR in 6 weeks.   Call us with any medication changes or concerns #[phone] Coumadin Clinic Main # [phone], Fax #[phone] or [phone]

## 2023-05-08 ENCOUNTER — Ambulatory Visit: Payer: Medicare PPO | Attending: Cardiology | Admitting: *Deleted

## 2023-05-08 DIAGNOSIS — Z5181 Encounter for therapeutic drug level monitoring: Secondary | ICD-10-CM | POA: Diagnosis not present

## 2023-05-08 LAB — POCT INR: POC INR: 3.6

## 2023-05-08 NOTE — Patient Instructions (Addendum)
 Description    Take 1 tablet of warfarin today Then Continue taking warfarin 1 tablet daily except for 1.5 tablets on Tuesday, Thursday and Saturday. Stay consistent with greens each week  Recheck INR in 5 weeks.   Call us with any medication changes or concerns #[phone] Coumadin Clinic Main # [phone], Fax #[phone] or [phone]      .  1st Floor: - Lobby - Registration  - Pharmacy  - Lab - Cafe  2nd Floor: - PV Lab - Diagnostic Testing (echo, CT, nuclear med)  3rd Floor: - Vacant  4th Floor: - TCTS (cardiothoracic surgery) - AFib Clinic - Structural Heart Clinic - Vascular Surgery  - Vascular Ultrasound  5th Floor: - [facility] (general and EP) - Clinical Pharmacy for coumadin, hypertension, lipid, weight-loss medications, and med management appointments    Valet parking services will be available as well.

## 2023-06-11 ENCOUNTER — Telehealth: Payer: Self-pay | Admitting: *Deleted

## 2023-06-11 NOTE — Telephone Encounter (Signed)
 Called pt to remind that his Anticoagulation Appt is tomorrow at the new location: [address] inside the building; spoke with he verbalized understanding.

## 2023-06-12 ENCOUNTER — Ambulatory Visit: Attending: Cardiology

## 2023-06-12 DIAGNOSIS — Z5181 Encounter for therapeutic drug level monitoring: Secondary | ICD-10-CM

## 2023-06-12 LAB — POCT INR: INR: 2.9 (ref 2.0–3.0)

## 2023-06-12 NOTE — Patient Instructions (Signed)
 Description   Continue taking warfarin 1 tablet daily except for 1.5 tablets on Tuesday, Thursday and Saturday. Stay consistent with greens each week  Recheck INR in 6 weeks.   Call us with any medication changes or concerns #[phone] Coumadin Clinic Main # [phone], Fax #[phone] or [phone]

## 2023-06-19 ENCOUNTER — Encounter

## 2023-07-21 ENCOUNTER — Other Ambulatory Visit: Payer: Self-pay

## 2023-07-21 ENCOUNTER — Telehealth: Payer: Self-pay | Admitting: Urology

## 2023-07-21 DIAGNOSIS — N400 Enlarged prostate without lower urinary tract symptoms: Secondary | ICD-10-CM

## 2023-07-21 DIAGNOSIS — R3129 Other microscopic hematuria: Secondary | ICD-10-CM

## 2023-07-21 MED ORDER — FINASTERIDE 5 MG PO TABS: 5.0000 mg | ORAL_TABLET | Freq: Every day | ORAL | 3 refills | Status: AC

## 2023-07-21 NOTE — Telephone Encounter (Signed)
 Return call to patient and made aware Rx sent with correct address. Patient voiced understanding.

## 2023-07-21 NOTE — Telephone Encounter (Signed)
 Patient called his pharmacy called and said that the RX they have for the finasteride  (PROSCAR ) 5 MG tablet [[id number]]  is coming up with [address] instead of office address and they will not fill il. Please correct.

## 2023-07-24 ENCOUNTER — Ambulatory Visit: Attending: Cardiology | Admitting: *Deleted

## 2023-07-24 DIAGNOSIS — Z5181 Encounter for therapeutic drug level monitoring: Secondary | ICD-10-CM | POA: Diagnosis not present

## 2023-07-24 DIAGNOSIS — Z952 Presence of prosthetic heart valve: Secondary | ICD-10-CM | POA: Diagnosis not present

## 2023-07-24 LAB — POCT INR: POC INR: 2.7

## 2023-07-24 NOTE — Patient Instructions (Signed)
 Description   Continue taking warfarin 1 tablet daily except for 1.5 tablets on Tuesday, Thursday and Saturday. Stay consistent with greens each week  Recheck INR in 6 weeks.   Call us with any medication changes or concerns #[phone] Coumadin Clinic Main # [phone], Fax #[phone] or [phone]

## 2023-08-08 NOTE — Progress Notes (Signed)
 Anticoag encounter

## 2023-08-13 ENCOUNTER — Other Ambulatory Visit: Payer: Medicare Other

## 2023-08-20 ENCOUNTER — Ambulatory Visit: Payer: Medicare Other | Admitting: Urology

## 2023-09-04 ENCOUNTER — Ambulatory Visit: Attending: Cardiology

## 2023-09-04 DIAGNOSIS — Z5181 Encounter for therapeutic drug level monitoring: Secondary | ICD-10-CM

## 2023-09-04 LAB — POCT INR: INR: 2.9 (ref 2.0–3.0)

## 2023-09-04 NOTE — Patient Instructions (Signed)
 Description   Continue taking warfarin 1 tablet daily except for 1.5 tablets on Tuesday, Thursday and Saturday. Stay consistent with greens each week  Recheck INR in 6 weeks.   Call us with any medication changes or concerns #[phone] Coumadin Clinic Main # [phone], Fax #[phone] or [phone]

## 2023-09-04 NOTE — Progress Notes (Signed)
 INR 2.9; Please see anticoagulation encounter

## 2023-10-07 ENCOUNTER — Other Ambulatory Visit: Payer: Self-pay | Admitting: Cardiology

## 2023-10-16 ENCOUNTER — Ambulatory Visit

## 2023-10-20 ENCOUNTER — Ambulatory Visit: Attending: Cardiology

## 2023-10-20 DIAGNOSIS — Z5181 Encounter for therapeutic drug level monitoring: Secondary | ICD-10-CM | POA: Diagnosis not present

## 2023-10-20 LAB — POCT INR: INR: 2.6 (ref 2.0–3.0)

## 2023-10-20 NOTE — Patient Instructions (Signed)
 Continue taking warfarin 1 tablet daily except for 1.5 tablets on Tuesday, Thursday and Saturday. Stay consistent with greens each week  Recheck INR in 6 weeks.   Call us  with any medication changes or concerns [phone] Coumadin  Clinic Main # [phone], Fax #[phone] or [phone]

## 2023-10-20 NOTE — Progress Notes (Signed)
 INR 2.6 Please see anticoagulation encounter Continue taking warfarin 1 tablet daily except for 1.5 tablets on Tuesday, Thursday and Saturday. Stay consistent with greens each week  Recheck INR in 6 weeks.   Call us  with any medication changes or concerns [phone] Coumadin  Clinic Main # [phone], Fax #[phone] or [phone]

## 2023-10-23 ENCOUNTER — Telehealth: Payer: Self-pay | Admitting: *Deleted

## 2023-10-23 NOTE — Telephone Encounter (Signed)
   Pre-operative Risk Assessment    Patient Name: Nathan Frye  DOB: [birth year] MRN: [id number]   Date of last office visit: 12/19/22 DR. SWAZILAND  Date of next office visit: NONE   Request for Surgical Clearance    Procedure:  EGD/COLONOSCOPY  Date of Surgery:  Clearance 11/28/23                                Surgeon:  DR. MANN Surgeon's Group or Practice Name:  [facility] Phone number:  [phone] Fax number:  [phone]   Type of Clearance Requested:   - Medical  - Pharmacy:  Hold Warfarin (Coumadin )     Type of Anesthesia:  PROPOFOL     Additional requests/questions:    Signed, Rayyan Burley   10/23/2023, 5:01 PM

## 2023-10-24 ENCOUNTER — Other Ambulatory Visit: Payer: Self-pay | Admitting: Cardiology

## 2023-11-03 NOTE — Telephone Encounter (Signed)
   Name: Nathan Frye  DOB: 24-Jul-1950  MRN: [id number]  Primary Cardiologist: [name], MD   Preoperative team, please contact this patient and set up a phone call appointment for further preoperative risk assessment. Please obtain consent and complete medication review. Thank you for your help.  I confirm that guidance regarding antiplatelet and oral anticoagulation therapy has been completed and, if necessary, noted below. Per office protocol, patient can hold warfarin for 5 days prior to procedure.   Patient will not need bridging with Lovenox (enoxaparin) around procedure  I also confirmed the patient resides in the state of [place] . As per [facility] Medical Board telemedicine laws, the patient must reside in the state in which the provider is licensed.   Emmeline Winebarger E [name], PA-C 11/03/2023, 2:40 PM [facility]

## 2023-11-03 NOTE — Telephone Encounter (Signed)
 Patient with diagnosis of mechanical AVR on warfarin for anticoagulation.    Procedure:  EGD/COLONOSCOPY   Date of Surgery:  Clearance 11/28/23    CrCl 54 Platelet count 247  Per office protocol, patient can hold warfarin for 5 days prior to procedure.   Patient will not need bridging with Lovenox (enoxaparin) around procedure.  **This guidance is not considered finalized until pre-operative APP has relayed final recommendations.**

## 2023-11-03 NOTE — Telephone Encounter (Signed)
 Attempt to call patient, no answer left a vm to call back to set up a televisit appointment for a pre-op clearance.

## 2023-11-04 ENCOUNTER — Telehealth: Payer: Self-pay | Admitting: *Deleted

## 2023-11-04 NOTE — Telephone Encounter (Signed)
 Pt has been scheduled tele preop appt 11/12/23. Med rec and consent are done.

## 2023-11-04 NOTE — Telephone Encounter (Signed)
 Pt has been scheduled tele preop appt 11/12/23. Med rec and consent are done.      Patient Consent for Virtual Visit        Nathan Frye has provided verbal consent on 11/04/2023 for a virtual visit (video or telephone).   CONSENT FOR VIRTUAL VISIT FOR:  Nathan Frye  By participating in this virtual visit I agree to the following:  I hereby voluntarily request, consent and authorize [facility] and its employed or contracted physicians, physician assistants, nurse practitioners or other licensed health care professionals (the Practitioner), to provide me with telemedicine health care services (the "Services) as deemed necessary by the treating Practitioner. I acknowledge and consent to receive the Services by the Practitioner via telemedicine. I understand that the telemedicine visit will involve communicating with the Practitioner through live audiovisual communication technology and the disclosure of certain medical information by electronic transmission. I acknowledge that I have been given the opportunity to request an in-person assessment or other available alternative prior to the telemedicine visit and am voluntarily participating in the telemedicine visit.  I understand that I have the right to withhold or withdraw my consent to the use of telemedicine in the course of my care at any time, without affecting my right to future care or treatment, and that the Practitioner or I may terminate the telemedicine visit at any time. I understand that I have the right to inspect all information obtained and/or recorded in the course of the telemedicine visit and may receive copies of available information for a reasonable fee.  I understand that some of the potential risks of receiving the Services via telemedicine include:  Delay or interruption in medical evaluation due to technological equipment failure or disruption; Information transmitted may not be sufficient (e.g. poor  resolution of images) to allow for appropriate medical decision making by the Practitioner; and/or  In rare instances, security protocols could fail, causing a breach of personal health information.  Furthermore, I acknowledge that it is my responsibility to provide information about my medical history, conditions and care that is complete and accurate to the best of my ability. I acknowledge that Practitioner's advice, recommendations, and/or decision may be based on factors not within their control, such as incomplete or inaccurate data provided by me or distortions of diagnostic images or specimens that may result from electronic transmissions. I understand that the practice of medicine is not an exact science and that Practitioner makes no warranties or guarantees regarding treatment outcomes. I acknowledge that a copy of this consent can be made available to me via my patient portal [facility] MyChart), or I can request a printed copy by calling the office of [facility].    I understand that my insurance will be billed for this visit.   I have read or had this consent read to me. I understand the contents of this consent, which adequately explains the benefits and risks of the Services being provided via telemedicine.  I have been provided ample opportunity to ask questions regarding this consent and the Services and have had my questions answered to my satisfaction. I give my informed consent for the services to be provided through the use of telemedicine in my medical care

## 2023-11-12 ENCOUNTER — Ambulatory Visit: Attending: Internal Medicine | Admitting: Nurse Practitioner

## 2023-11-12 ENCOUNTER — Encounter: Payer: Self-pay | Admitting: Nurse Practitioner

## 2023-11-12 DIAGNOSIS — Z01810 Encounter for preprocedural cardiovascular examination: Secondary | ICD-10-CM | POA: Diagnosis not present

## 2023-11-12 NOTE — Progress Notes (Signed)
 Virtual Visit via Telephone Note   Because of Nathan Frye co-morbid illnesses, he is at least at moderate risk for complications without adequate follow up.  This format is felt to be most appropriate for this patient at this time.  Due to technical limitations with video connection (technology), today's appointment will be conducted as an audio only telehealth visit, and Nathan Frye verbally agreed to proceed in this manner.   All issues noted in this document were discussed and addressed.  No physical exam could be performed with this format.  Evaluation Performed:  Preoperative cardiovascular risk assessment _____________   Date:  11/12/2023   Patient ID:  Nathan Frye, DOB 1950/08/14, MRN [id number] Patient Location:  Home Provider location:   Office  Primary Care Provider:  Reita Locus, MD Primary Cardiologist:  Nathan Sw[place], MD  Chief Complaint / Patient Profile   73 y.o. y/o male with a h/o S/p AVR 2007, HLD, hypertension who is pending EGD/colonoscopy on 11/28/23 with Dr. Kristie and presents today for telephonic preoperative cardiovascular risk assessment.  History of Present Illness    Nathan Frye is a 73 y.o. male who presents via audio/video conferencing for a telehealth visit today.  Pt was last seen in cardiology clinic on 12/19/22 by Dr. Swaziland.  At that time Nathan Frye was doing well.  The patient is now pending procedure as outlined above. Since his last visit, he  denies chest pain, shortness of breath, lower extremity edema, fatigue, palpitations, melena, hematuria, hemoptysis, diaphoresis, weakness, presyncope, syncope, orthopnea, and PND. He remains active with regular exercise but does not participate in heavy lifting due to history of a back injury which he guards carefully.  He is able to complete > 4 METS activity without concerning cardiac symptoms.   Past Medical History    Past Medical History:  Diagnosis Date   Chronic  anticoagulation    HTN (hypertension)    Hypercholesteremia    Severe aortic stenosis    Past Surgical History:  Procedure Laterality Date   AORTIC VALVE REPLACEMENT  2007   #21 MM [facility] VALVE   TRANSURETHRAL RESECTION OF PROSTATE N/A 07/25/2022   Procedure: TRANSURETHRAL RESECTION OF THE PROSTATE (TURP);  Surgeon: Nathan Nathan LITTIE, MD;  Location: [facility];  Service: Urology;  Laterality: N/A;   VASECTOMY      Allergies  Allergies  Allergen Reactions   Ciprofloxacin      Patient states he does not want to take Cipro  anymore do to an interaction with the coumadin  he is chronically taking. Patient request that a note be placed in his chart to not give him Cipro .    Home Medications    Prior to Admission medications   Medication Sig Start Date End Date Taking? Authorizing Provider  Ergocalciferol 2000 UNITS TABS Take 2,000 Units by mouth daily.    [provider]  ferrous sulfate  325 (65 FE) MG EC tablet Take 1 tablet by mouth daily. 02/10/20   [provider]  finasteride  (PROSCAR ) 5 MG tablet Take 1 tablet (5 mg total) by mouth daily. 07/21/23   McKenzie, Nathan LITTIE, MD  hydrochlorothiazide  (HYDRODIURIL ) 25 MG tablet Take 1 tablet (25 mg total) by mouth daily. 02/18/23 02/13/24  Frye, Nathan M, MD  HYDROcodone -acetaminophen  (NORCO) 5-325 MG tablet Take 1 tablet by mouth every 6 (six) hours as needed for moderate pain. 07/26/22   McKenzie, Nathan LITTIE, MD  latanoprost  (XALATAN ) 0.005 % ophthalmic solution 1 drop at bedtime. 04/08/20  [provider]  losartan  (COZAAR ) 100 MG tablet Take 1 tablet (100 mg total) by mouth daily. 01/16/23   Frye, Nathan M, MD  metoprolol  succinate (TOPROL -XL) 25 MG 24 hr tablet TAKE 1 TABLET (25 MG TOTAL) BY MOUTH DAILY. 10/07/23   Frye, Nathan M, MD  rosuvastatin  (CRESTOR ) 10 MG tablet TAKE 1 TABLET BY MOUTH EVERY DAY 01/17/23   Frye, Nathan M, MD  warfarin (COUMADIN ) 5 MG tablet TAKE 1 TO 1 AND 1/2 TABLETS DAILY AS DIRECTED  BY COUMADIN  CLINIC 10/21/22   Frye, Nathan M, MD  Wheat Dextrin (BENEFIBER) POWD Take 1 Scoop by mouth daily.    [provider]    Physical Exam    Vital Signs:  Nathan Frye does not have vital signs available for review today.  Given telephonic nature of communication, physical exam is limited. AAOx3. NAD. Normal affect.  Speech and respirations are unlabored.  Accessory Clinical Findings    None  Assessment & Plan    1.  Preoperative Cardiovascular Risk Assessment: According to the Revised Cardiac Risk Index (RCRI), his Perioperative Risk of Major Cardiac Event is (%): 0.4. His Functional Capacity in METs is: 6.05 according to the Duke Activity Status Index (DASI). The patient is doing well from a cardiac perspective. Therefore, based on ACC/AHA guidelines, the patient would be at acceptable risk for the planned procedure without further cardiovascular testing.   The patient was advised that if he develops new symptoms prior to surgery to contact our office to arrange for a follow-up visit, and he verbalized understanding.  Per office protocol, patient can hold warfarin for 5 days prior to procedure and should resume as soon as hemodynamically stable post-procedure. Patient will not need bridging with Lovenox (enoxaparin) around procedure.  A copy of this note will be routed to requesting surgeon.  Time:   Today, I have spent 7 minutes with the patient with telehealth technology discussing medical history, symptoms, and management plan.     Nathan EMERSON Bane, NP-C  11/12/2023, 11:03 AM [address], [address] [place], [place] [address] Office [phone] Fax [phone]

## 2023-11-23 ENCOUNTER — Other Ambulatory Visit: Payer: Self-pay | Admitting: Cardiology

## 2023-11-23 DIAGNOSIS — Z952 Presence of prosthetic heart valve: Secondary | ICD-10-CM

## 2023-11-24 NOTE — Telephone Encounter (Signed)
 Warfarin 5mg  refill Heart valve replaced by other means  Last INR 10/20/23 Last OV 11/12/23 viz TeleVisit

## 2023-11-27 ENCOUNTER — Other Ambulatory Visit

## 2023-11-27 DIAGNOSIS — R972 Elevated prostate specific antigen [PSA]: Secondary | ICD-10-CM

## 2023-11-28 LAB — PSA: Prostate Specific Ag, Serum: 1 ng/mL (ref 0.0–4.0)

## 2023-12-01 ENCOUNTER — Ambulatory Visit: Attending: Cardiovascular Disease

## 2023-12-01 DIAGNOSIS — Z5181 Encounter for therapeutic drug level monitoring: Secondary | ICD-10-CM | POA: Diagnosis not present

## 2023-12-01 LAB — POCT INR: INR: 1.9 — AB (ref 2.0–3.0)

## 2023-12-01 NOTE — Patient Instructions (Signed)
 Take 2 tablets today only then Continue taking warfarin 1 tablet daily except for 1.5 tablets on Tuesday, Thursday and Saturday. Stay consistent with greens each week  Recheck INR in 6 weeks.   Call us  with any medication changes or concerns [phone] Coumadin  Clinic Main # [phone], Fax #[phone] or [phone]

## 2023-12-01 NOTE — Progress Notes (Signed)
 INR 1.9 Please see anticoagulation encounter Take 2 tablets today only then Continue taking warfarin 1 tablet daily except for 1.5 tablets on Tuesday, Thursday and Saturday. Stay consistent with greens each week  Recheck INR in 6 weeks.   Call us  with any medication changes or concerns [phone] Coumadin  Clinic Main # [phone], Fax #[phone] or [phone]

## 2023-12-02 ENCOUNTER — Other Ambulatory Visit

## 2023-12-02 ENCOUNTER — Ambulatory Visit: Payer: Self-pay | Admitting: Urology

## 2023-12-03 ENCOUNTER — Ambulatory Visit: Admitting: Urology

## 2023-12-03 VITALS — BP 135/70 | HR 57

## 2023-12-03 DIAGNOSIS — N400 Enlarged prostate without lower urinary tract symptoms: Secondary | ICD-10-CM | POA: Diagnosis not present

## 2023-12-03 DIAGNOSIS — R972 Elevated prostate specific antigen [PSA]: Secondary | ICD-10-CM

## 2023-12-03 DIAGNOSIS — R339 Retention of urine, unspecified: Secondary | ICD-10-CM

## 2023-12-03 LAB — URINALYSIS, ROUTINE W REFLEX MICROSCOPIC
Bilirubin, UA: NEGATIVE
Glucose, UA: NEGATIVE
Ketones, UA: NEGATIVE
Leukocytes,UA: NEGATIVE
Nitrite, UA: NEGATIVE
Protein,UA: NEGATIVE
RBC, UA: NEGATIVE
Specific Gravity, UA: 1.015 (ref 1.005–1.030)
Urobilinogen, Ur: 0.2 mg/dL (ref 0.2–1.0)
pH, UA: 7.5 (ref 5.0–7.5)

## 2023-12-03 NOTE — Patient Instructions (Signed)

## 2023-12-03 NOTE — Progress Notes (Signed)
 12/03/2023 11:19 AM   Nathan Frye 22-Aug-1950 [id number]  Referring provider: Reita Locus, MD [address] [place],  [place] [address]  Followup BPH   HPI: [name] is a 73yo here for followup for elevated PSA and BPH with urinary retention. PSA 1.0 on finasteride . IPSS 2 QOL 0 after TURP. Urine stream strong. No straining to urinate. Nocturia 0-1x. Since the TURP his erections are not as firm. He is not bothered by his erectile dysfunction.   PMH: Past Medical History:  Diagnosis Date   Chronic anticoagulation    HTN (hypertension)    Hypercholesteremia    Severe aortic stenosis     Surgical History: Past Surgical History:  Procedure Laterality Date   AORTIC VALVE REPLACEMENT  2007   #21 MM [facility] VALVE   TRANSURETHRAL RESECTION OF PROSTATE N/A 07/25/2022   Procedure: TRANSURETHRAL RESECTION OF THE PROSTATE (TURP);  Surgeon: Sherrilee Belvie LITTIE, MD;  Location: [facility];  Service: Urology;  Laterality: N/A;   VASECTOMY      Home Medications:  Allergies as of 12/03/2023       Reactions   Ciprofloxacin     Patient states he does not want to take Cipro  anymore do to an interaction with the coumadin  he is chronically taking. Patient request that a note be placed in his chart to not give him Cipro .        Medication List        Accurate as of December 03, 2023 11:19 AM. If you have any questions, ask your nurse or doctor.          Benefiber Powd Take 1 Scoop by mouth daily.   Ergocalciferol 50 MCG (2000 UT) Tabs Take 2,000 Units by mouth daily.   ferrous sulfate  325 (65 FE) MG EC tablet Take 1 tablet by mouth daily.   finasteride  5 MG tablet Commonly known as: PROSCAR  Take 1 tablet (5 mg total) by mouth daily.   hydrochlorothiazide  25 MG tablet Commonly known as: HYDRODIURIL  Take 1 tablet (25 mg total) by mouth daily.   HYDROcodone -acetaminophen  5-325 MG tablet Commonly known as: Norco Take 1 tablet by mouth every 6 (six) hours as  needed for moderate pain.   latanoprost  0.005 % ophthalmic solution Commonly known as: XALATAN  1 drop at bedtime.   losartan  100 MG tablet Commonly known as: COZAAR  Take 1 tablet (100 mg total) by mouth daily.   metoprolol  succinate 25 MG 24 hr tablet Commonly known as: TOPROL -XL TAKE 1 TABLET (25 MG TOTAL) BY MOUTH DAILY.   rosuvastatin  10 MG tablet Commonly known as: CRESTOR  TAKE 1 TABLET BY MOUTH EVERY DAY   warfarin 5 MG tablet Commonly known as: COUMADIN  Take as directed by the anticoagulation clinic. If you are unsure how to take this medication, talk to your nurse or doctor. Original instructions: TAKE 1 TO 1 AND 1/2 TABLETS DAILY AS DIRECTED BY COUMADIN  CLINIC        Allergies:  Allergies  Allergen Reactions   Ciprofloxacin      Patient states he does not want to take Cipro  anymore do to an interaction with the coumadin  he is chronically taking. Patient request that a note be placed in his chart to not give him Cipro .    Family History: Family History  Problem Relation Age of Onset   Hypertension Mother     Social History:  reports that he has never smoked. He has never used smokeless tobacco. He reports that he does not drink alcohol and  does not use drugs.  ROS: All other review of systems were reviewed and are negative except what is noted above in HPI  Physical Exam: BP 135/70   Pulse (!) 57   Constitutional:  Alert and oriented, No acute distress. HEENT: [place] AT, moist mucus membranes.  Trachea midline, no masses. Cardiovascular: No clubbing, cyanosis, or edema. Respiratory: Normal respiratory effort, no increased work of breathing. GI: Abdomen is soft, nontender, nondistended, no abdominal masses GU: No CVA tenderness.  Lymph: No cervical or inguinal lymphadenopathy. Skin: No rashes, bruises or suspicious lesions. Neurologic: Grossly intact, no focal deficits, moving all 4 extremities. Psychiatric: Normal mood and affect.  Laboratory Data: Lab  Results  Component Value Date   WBC 10.6 (H) 07/26/2022   HGB 11.0 (L) 07/26/2022   HCT 33.7 (L) 07/26/2022   MCV 89.2 07/26/2022   PLT 264 07/26/2022    Lab Results  Component Value Date   CREATININE 1.30 (H) 11/15/2022    No results found for: PSA  No results found for: TESTOSTERONE  No results found for: HGBA1C  Urinalysis    Component Value Date/Time   COLORURINE YELLOW 06/04/2022 1607   APPEARANCEUR Clear 02/14/2023 1123   LABSPEC 1.012 06/04/2022 1607   PHURINE 5.0 06/04/2022 1607   GLUCOSEU Negative 02/14/2023 1123   HGBUR LARGE (A) 06/04/2022 1607   BILIRUBINUR Negative 02/14/2023 1123   KETONESUR NEGATIVE 06/04/2022 1607   PROTEINUR Negative 02/14/2023 1123   PROTEINUR 30 (A) 06/04/2022 1607   NITRITE Negative 02/14/2023 1123   NITRITE NEGATIVE 06/04/2022 1607   LEUKOCYTESUR Negative 02/14/2023 1123   LEUKOCYTESUR MODERATE (A) 06/04/2022 1607    Lab Results  Component Value Date   LABMICR Comment 02/14/2023   WBCUA 0-5 11/12/2022   LABEPIT 0-10 11/12/2022   BACTERIA None seen 11/12/2022    Pertinent Imaging:  No results found for this or any previous visit.  No results found for this or any previous visit.  No results found for this or any previous visit.  No results found for this or any previous visit.  No results found for this or any previous visit.  No results found for this or any previous visit.  Results for orders placed during the hospital encounter of 11/15/22  CT HEMATURIA WORKUP  Narrative CLINICAL DATA:  Microscopic hematuria  EXAM: CT ABDOMEN AND PELVIS WITHOUT AND WITH CONTRAST  TECHNIQUE: Multidetector CT imaging of the abdomen and pelvis was performed following the standard protocol before and following the bolus administration of intravenous contrast.  RADIATION DOSE REDUCTION: This exam was performed according to the departmental dose-optimization program which includes automated exposure control,  adjustment of the mA and/or kV according to patient size and/or use of iterative reconstruction technique.  CONTRAST:  OMNIPAQUE  IOHEXOL  350 MG/ML SOLN  COMPARISON:  None Available.  FINDINGS: Lower chest: Right lower lobe calcified granuloma.  A 2-3 mm left lower lobe pulmonary nodule can be presumed benign and do/does not warrant imaging follow-up per Fleischner criteria. Normal heart size without pericardial or pleural effusion. Median sternotomy.  Hepatobiliary: Normal liver. Normal gallbladder, without biliary ductal dilatation.  Pancreas: Normal, without mass or ductal dilatation.  Spleen: Old granulomatous disease within. Subcentimeter low-density splenic lesion is most likely a cyst and of no clinical significance.  Adrenals/Urinary Tract: Normal adrenal glands. No renal calculi or hydronephrosis. No hydroureter or ureteric calculi. No bladder calculi.  No suspicious renal mass on post-contrast imaging. Upper pole left renal too small to characterize lesion is most likely a  cyst . In the absence of clinically indicated signs/symptoms require(s) no independent follow-up.  Good renal collecting system opacification on delayed images. Good ureteric opacification, without filling defect.  No enhancing bladder mass or filling defect on delayed images. Minimal impression of the prostatic median lobe into the urinary bladder.  Stomach/Bowel: Normal stomach, without wall thickening. Colonic stool burden suggests constipation. The appendix is moderately enlarged, including at up to 11 mm on 43/205. Also coronal image 72/204. No significant periappendiceal edema.  Normal terminal ileum.  Normal small bowel.  Vascular/Lymphatic: Aortic atherosclerosis. No abdominopelvic adenopathy.  Reproductive: Mild prostatomegaly. Heterogeneity within is likely related to benign prostatic hyperplasia, as on 12/15/2021 MRI.  Other: No significant free  fluid.  Musculoskeletal: No acute osseous abnormality.  IMPRESSION: 1.  No acute process or explanation for hematuria. 2. Enlargement of the appendix without periappendiceal edema. Mucocele of the appendix cannot be excluded. Consider correlation with colonoscopy with attention to the appendiceal orifice. Also correlate with right lower quadrant symptoms, as early appendicitis could have this appearance but is felt less likely. 3.  Aortic Atherosclerosis (ICD10-I70.0).  These results will be called to the ordering clinician or representative by the Radiologist Assistant, and communication documented in the PACS or [employer].   Electronically Signed By: Rockey Kilts M.D. On: 11/28/2022 10:22  No results found for this or any previous visit.   Assessment & Plan:    1. Elevated PSA (Primary) Followup 1 year with a PSA - Urinalysis, Routine w reflex microscopic  2. Benign prostatic hyperplasia, unspecified whether lower urinary tract symptoms present -improved after TURP  3. Urinary retention Resolved.    No follow-ups on file.  Belvie Clara, MD  [facility] [place]

## 2023-12-08 ENCOUNTER — Telehealth: Payer: Self-pay | Admitting: Cardiology

## 2023-12-08 ENCOUNTER — Ambulatory Visit: Admitting: Urology

## 2023-12-08 MED ORDER — AMOXICILLIN 500 MG PO CAPS: ORAL_CAPSULE | ORAL | 0 refills | Status: AC

## 2023-12-08 NOTE — Telephone Encounter (Signed)
 Pt would like to know if a medication is needed before his dental cleaning tomorrow.

## 2023-12-08 NOTE — Telephone Encounter (Signed)
 Spoke with pt and explained that we will send in a rx for Amoxicillin  2000mg  1 hour before appt (4 tabs of 500mg ). Pt verbalized understanding of plan and had no further questions at this time. Rx sent to pt preferred pharmacy.

## 2023-12-09 ENCOUNTER — Encounter: Payer: Self-pay | Admitting: Urology

## 2024-01-04 ENCOUNTER — Other Ambulatory Visit: Payer: Self-pay | Admitting: Cardiology

## 2024-01-10 ENCOUNTER — Other Ambulatory Visit: Payer: Self-pay | Admitting: Cardiology

## 2024-01-12 ENCOUNTER — Ambulatory Visit: Attending: Cardiology

## 2024-01-12 DIAGNOSIS — Z5181 Encounter for therapeutic drug level monitoring: Secondary | ICD-10-CM | POA: Diagnosis not present

## 2024-01-12 LAB — POCT INR: INR: 2.8 (ref 2.0–3.0)

## 2024-01-12 NOTE — Progress Notes (Signed)
 INR 2.8 Please see anticoagulation encounter Continue taking warfarin 1 tablet daily except for 1.5 tablets on Tuesday, Thursday and Saturday. Stay consistent with greens each week  Recheck INR in 6 weeks.   Call us  with any medication changes or concerns [phone] Coumadin  Clinic Main # [phone], Fax #[phone] or [phone]

## 2024-01-12 NOTE — Patient Instructions (Signed)
 Continue taking warfarin 1 tablet daily except for 1.5 tablets on Tuesday, Thursday and Saturday. Stay consistent with greens each week  Recheck INR in 6 weeks.   Call us  with any medication changes or concerns [phone] Coumadin  Clinic Main # [phone], Fax #[phone] or [phone]

## 2024-01-14 ENCOUNTER — Ambulatory Visit (HOSPITAL_COMMUNITY)
Admission: RE | Admit: 2024-01-14 | Discharge: 2024-01-14 | Disposition: A | Attending: Gastroenterology | Admitting: Gastroenterology

## 2024-01-14 ENCOUNTER — Encounter (HOSPITAL_COMMUNITY)
Admission: RE | Disposition: A | Discharge status: Home / Self Care | Payer: Self-pay | Source: Home / Self Care | Attending: Gastroenterology

## 2024-01-14 DIAGNOSIS — D509 Iron deficiency anemia, unspecified: Secondary | ICD-10-CM | POA: Insufficient documentation

## 2024-01-14 HISTORY — PX: GIVENS CAPSULE STUDY: SHX5432

## 2024-01-14 SURGERY — IMAGING PROCEDURE, GI TRACT, INTRALUMINAL, VIA CAPSULE
Anesthesia: LOCAL

## 2024-01-14 SURGICAL SUPPLY — 1 items: TOWEL COTTON PACK 4EA (MISCELLANEOUS) ×2 IMPLANT

## 2024-01-14 NOTE — Progress Notes (Signed)
 [facility] capsule endoscopy ordered by MD Kristie.  Patient ingested capsule at 0840.  Per Given's capsule instructions, patient to remain NPO until 1040 at which time they may progress to clear liquid diet. At 1240 patient may have a small snack such as a half a sandwich or a bowl of soup. At 1640 patient may progress to previously ordered diet.  The capsule endoscopy study will conclude at 1640 at which time the recorder and leads or belt can be removed and placed in a patient belongings bag. Endoscopy staff will pick up the equipment in the AM.  Instructions provided to patient and inpatient RN. Patient and RN demonstrated understanding.

## 2024-01-15 ENCOUNTER — Encounter (HOSPITAL_COMMUNITY): Payer: Self-pay | Admitting: Gastroenterology

## 2024-02-03 ENCOUNTER — Other Ambulatory Visit: Payer: Self-pay | Admitting: Cardiology

## 2024-02-09 ENCOUNTER — Other Ambulatory Visit: Payer: Self-pay | Admitting: Cardiology

## 2024-02-13 ENCOUNTER — Telehealth: Payer: Self-pay | Admitting: Cardiology

## 2024-02-13 MED ORDER — AMOXICILLIN 500 MG PO CAPS: 2000.0000 mg | ORAL_CAPSULE | Freq: Once | ORAL | 0 refills | Status: AC

## 2024-02-13 NOTE — Telephone Encounter (Signed)
 Pt having graft for receeding gums and requesting antibiotics prior to dental procedures. Prescription sent to [facility] per pt request. Verbalizes understanding of plan.

## 2024-02-13 NOTE — Telephone Encounter (Signed)
 Pt c/o medication issue:  1. Name of Medication:   Antibiotic  2. How are you currently taking this medication (dosage and times per day)?   3. Are you having a reaction (difficulty breathing--STAT)?   4. What is your medication issue?   Patient stated he is having dental procedure on 1/21 and will need prescription for medication to be taken prior to procedure.

## 2024-02-17 NOTE — Progress Notes (Unsigned)
 "  Nathan Frye Date of Birth: 06/20/1950   History of Present Illness: Nathan Frye is seen today for followup of his aortic valve disease. 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 Last Echo in July 2017 showed normal valve function.   On follow up today he is doing very well. He denies any  symptoms of chest pain, shortness of breath, or palpitations. He continues to exercise regularly 5 days a week. He is semi retiredherbalist part time.  He has recently been evaluated by GI for mild iron deficiency anemia. Reports colonoscopy and capsule endoscopy were negative.     Current Outpatient Medications on File Prior to Visit  Medication Sig Dispense Refill   amoxicillin  (AMOXIL ) 500 MG capsule Take 2,000 mg (4 - 500 mg tablets) 1 hour prior to your scheduled dental appointment. 4 capsule 0   Ergocalciferol 2000 UNITS TABS Take 2,000 Units by mouth daily.     ferrous sulfate  325 (65 FE) MG EC tablet Take 1 tablet by mouth daily.     finasteride  (PROSCAR ) 5 MG tablet Take 1 tablet (5 mg total) by mouth daily. 90 tablet 3   hydrochlorothiazide  (HYDRODIURIL ) 25 MG tablet Take 1 tablet (25 mg total) by mouth daily. 1580 tablet 0   HYDROcodone -acetaminophen  (NORCO) 5-325 MG tablet Take 1 tablet by mouth every 6 (six) hours as needed for moderate pain. 30 tablet 0   latanoprost  (XALATAN ) 0.005 % ophthalmic solution 1 drop at bedtime.     losartan  (COZAAR ) 100 MG tablet Take 1 tablet (100 mg total) by mouth daily. 90 tablet 3   metoprolol  succinate (TOPROL -XL) 25 MG 24 hr tablet TAKE 1 TABLET (25 MG TOTAL) BY MOUTH DAILY. 90 tablet 1   rosuvastatin  (CRESTOR ) 10 MG tablet TAKE 1 TABLET BY MOUTH EVERY DAY 90 tablet 1   warfarin (COUMADIN ) 5 MG tablet TAKE 1 TO 1 AND 1/2 TABLETS DAILY AS DIRECTED BY COUMADIN  CLINIC 135 tablet 1   Wheat Dextrin (BENEFIBER) POWD Take 1 Scoop by mouth daily.     No current facility-administered medications on file prior to visit.     Allergies  Allergen Reactions   Ciprofloxacin      Patient states he does not want to take Cipro  anymore do to an interaction with the coumadin  he is chronically taking. Patient request that a note be placed in his chart to not give him Cipro .    Past Medical History:  Diagnosis Date   Chronic anticoagulation    HTN (hypertension)    Hypercholesteremia    Severe aortic stenosis     Past Surgical History:  Procedure Laterality Date   AORTIC VALVE REPLACEMENT  2007   #21 MM [facility] VALVE   GIVENS CAPSULE STUDY N/A 01/14/2024   Procedure: IMAGING PROCEDURE, GI TRACT, INTRALUMINAL, VIA CAPSULE;  Surgeon: Kristie Lamprey, MD;  Location: [facility] ENDOSCOPY;  Service: Gastroenterology;  Laterality: N/A;   TRANSURETHRAL RESECTION OF PROSTATE N/A 07/25/2022   Procedure: TRANSURETHRAL RESECTION OF THE PROSTATE (TURP);  Surgeon: Sherrilee Belvie LITTIE, MD;  Location: [facility] ORS;  Service: Urology;  Laterality: N/A;   VASECTOMY      Social History   Tobacco Use  Smoking Status Never  Smokeless Tobacco Never    Social History   Substance and Sexual Activity  Alcohol Use No   Alcohol/week: 0.0 standard drinks of alcohol    Family History  Problem Relation Age of Onset   Hypertension Mother     Review  of Systems: As noted history of present illness.  All other systems were reviewed and are negative.  Physical Exam: BP 110/60 (BP Location: Left Arm, Patient Position: Sitting, Cuff Size: Normal)   Pulse 69   Ht 5' 9 (1.753 m)   Wt 169 lb (76.7 kg)   BMI 24.96 kg/m  GENERAL:  Well appearing Nathan Frye in NAD HEENT:  PERRL, EOMI, sclera are clear. Oropharynx is clear. NECK:  No jugular venous distention, carotid upstroke brisk and symmetric, no bruits, no thyromegaly or adenopathy LUNGS:  Clear to auscultation bilaterally CHEST:  Unremarkable HEART:  RRR,  PMI not displaced or sustained,S1 and S2 within normal limits, no S3, no S4: good mechanical AV click. Soft 1/6 systolic murmur at the  apex. ABD:  Soft, nontender. BS +, no masses or bruits. No hepatomegaly, no splenomegaly EXT:  2 + pulses throughout, no edema, no cyanosis no clubbing SKIN:  Warm and dry.  No rashes NEURO:  Alert and oriented x 3. Cranial nerves II through XII intact. PSYCH:  Cognitively intact   LABORATORY DATA: Lab Results  Component Value Date   WBC 10.6 (H) 07/26/2022   HGB 11.0 (L) 07/26/2022   HCT 33.7 (L) 07/26/2022   PLT 264 07/26/2022   GLUCOSE 115 (H) 07/26/2022   CHOL 163 05/02/2011   TRIG 75.0 05/02/2011   HDL 67.00 05/02/2011   LDLCALC 81 05/02/2011   ALT 24 10/02/2018   AST 38 10/02/2018   NA 135 07/26/2022   K 3.7 07/26/2022   CL 102 07/26/2022   CREATININE 1.30 (H) 11/15/2022   BUN 14 07/26/2022   CO2 26 07/26/2022   INR 2.8 01/12/2024     LABS reviewed from 04/27/15. Hgb 13.1. CMET normal, Cholesterol 167, Triglycerides 80, HDL 80, LDL 71. TSH normal. From 06/19/16: cholesterol 165, triglycerides 53. HDL 77, LDL 77. Hgb 13.2. CMET and TSH normal. Dated 09/16/17: Hgb 12.7. CMET normal. Cholesterol 175, triglycerides 64, HDL 79, LDL 83. TSH normal.  Dated 01/06/19: creatinine 1.25. otherwise CMET normal Dated 07/29/19: cholesterol 172, triglycerides 56, HDL 68, LDL 93. Hgb 12.2. Dated 01/31/20: cholesterol 170, triglycerides 71, HDL 68, LDL 88. Creatinine 1.2 otherwise CMET normal. Dated 09/17/21: Hgb 10.3, BUN 10, creatinine 1.31, otherwise BMET normal. PSA 9.2. cholesterol 160, triglycerides 52, HDL 63, LDL 86 Dated 09/17/23: Hgb 12.5. CMET normal. Cholesterol 176, trilgycerides 63, HDL 68, LDL 96   EKG Interpretation Date/Time:  Wednesday February 18 2024 15:55:55 EST Ventricular Rate:  69 PR Interval:  164 QRS Duration:  96 QT Interval:  398 QTC Calculation: 426 R Axis:   42  Text Interpretation: Normal sinus rhythm Minimal voltage criteria for LVH, may be normal variant ( Sokolow-Lyon ) Nonspecific ST and T wave abnormality When compared with ECG of 19-Dec-2022 08:58, No  significant change was found Confirmed by Annison Birchard [phone]) on 02/18/2024 3:59:46 PM    Echo: 09/11/15: Study Conclusions   - Left ventricle: The cavity size was normal. Wall thickness was   normal. Systolic function was normal. The estimated ejection   fraction was in the range of 55% to 60%. Wall motion was normal;   there were no regional wall motion abnormalities. Left   ventricular diastolic function parameters were normal. - Aortic valve: A mechanical prosthesis was present and functioning   normally. There was trivial regurgitation. Valve area (VTI): 1.58   cm^2. Valve area (Vmax): 1.62 cm^2. Valve area (Vmean): 1.72   cm^2. - Mitral valve: There was mild regurgitation.  Assessment / Plan:  1. Status post mechanical aortic valve replacement 2007. Satisfactory Echo 2017.  Clinically stable- normal valve click. Continue chronic anticoagulation.  Routine SBE prophylaxis. I will followup again in one year  2. Hypertension, BP is excellent. Continue losartan , HCT, metoprolol   3. Hypercholesterolemia-on Crestor .   4. s/p TURP  5. Elevated PSA. S/p TURP   "

## 2024-02-18 ENCOUNTER — Encounter: Payer: Self-pay | Admitting: Cardiology

## 2024-02-18 ENCOUNTER — Ambulatory Visit: Attending: Cardiology | Admitting: Cardiology

## 2024-02-18 VITALS — BP 110/60 | HR 69 | Ht 69.0 in | Wt 169.0 lb

## 2024-02-18 DIAGNOSIS — I10 Essential (primary) hypertension: Secondary | ICD-10-CM | POA: Diagnosis not present

## 2024-02-18 DIAGNOSIS — E7800 Pure hypercholesterolemia, unspecified: Secondary | ICD-10-CM

## 2024-02-18 DIAGNOSIS — Z952 Presence of prosthetic heart valve: Secondary | ICD-10-CM

## 2024-02-18 NOTE — Patient Instructions (Signed)

## 2024-02-22 ENCOUNTER — Other Ambulatory Visit: Payer: Self-pay | Admitting: Cardiology

## 2024-02-24 ENCOUNTER — Telehealth: Payer: Self-pay | Admitting: *Deleted

## 2024-02-24 ENCOUNTER — Ambulatory Visit

## 2024-02-24 NOTE — Telephone Encounter (Signed)
 Called patient since he missed his appointment today; there was no answer so left him a message to call back to reschedule.

## 2024-03-09 ENCOUNTER — Ambulatory Visit: Attending: Cardiology | Admitting: Pharmacist

## 2024-03-09 ENCOUNTER — Other Ambulatory Visit: Payer: Self-pay

## 2024-03-09 DIAGNOSIS — Z5181 Encounter for therapeutic drug level monitoring: Secondary | ICD-10-CM

## 2024-03-09 DIAGNOSIS — R972 Elevated prostate specific antigen [PSA]: Secondary | ICD-10-CM

## 2024-03-09 DIAGNOSIS — Z7901 Long term (current) use of anticoagulants: Secondary | ICD-10-CM | POA: Diagnosis not present

## 2024-03-09 DIAGNOSIS — Z952 Presence of prosthetic heart valve: Secondary | ICD-10-CM

## 2024-03-09 LAB — POCT INR: INR: 2.6 (ref 2.0–3.0)

## 2024-03-09 NOTE — Patient Instructions (Signed)
 Description   INR 2.6: Continue taking warfarin 1 tablet daily except for 1.5 tablets on Tuesday, Thursday and Saturday. Stay consistent with greens each week  Recheck INR in 4 weeks.   Call us  with any medication changes or concerns [phone] Coumadin  Clinic Main # [phone], Fax #[phone] or [phone]

## 2024-03-09 NOTE — Progress Notes (Signed)
 Description   INR 2.6: Continue taking warfarin 1 tablet daily except for 1.5 tablets on Tuesday, Thursday and Saturday. Stay consistent with greens each week  Recheck INR in 4 weeks.   Call us  with any medication changes or concerns [phone] Coumadin  Clinic Main # [phone], Fax #[phone] or [phone]

## 2024-04-06 ENCOUNTER — Ambulatory Visit

## 2024-11-26 ENCOUNTER — Other Ambulatory Visit

## 2024-12-03 ENCOUNTER — Ambulatory Visit: Admitting: Urology
